# Patient Record
Sex: Female | Born: 1975 | Race: White | Hispanic: No | Marital: Single
Health system: Southern US, Academic
[De-identification: ages and names within clinical notes are randomized; demographics above are authoritative.]

## PROBLEM LIST (undated history)

## (undated) ENCOUNTER — Encounter: Attending: Family Medicine | Primary: Family Medicine

## (undated) ENCOUNTER — Encounter: Attending: Neurology | Primary: Neurology

## (undated) ENCOUNTER — Encounter

## (undated) ENCOUNTER — Telehealth

## (undated) ENCOUNTER — Encounter: Payer: BLUE CROSS/BLUE SHIELD | Attending: Family Medicine | Primary: Family Medicine

## (undated) ENCOUNTER — Ambulatory Visit

## (undated) ENCOUNTER — Telehealth: Attending: Family Medicine | Primary: Family Medicine

## (undated) ENCOUNTER — Encounter: Attending: Clinical | Primary: Clinical

## (undated) ENCOUNTER — Ambulatory Visit: Attending: Family Medicine | Primary: Family Medicine

## (undated) ENCOUNTER — Encounter: Attending: Anesthesiology | Primary: Anesthesiology

## (undated) ENCOUNTER — Encounter: Attending: Specialist | Primary: Specialist

## (undated) ENCOUNTER — Ambulatory Visit: Payer: BLUE CROSS/BLUE SHIELD

## (undated) ENCOUNTER — Encounter
Attending: Student in an Organized Health Care Education/Training Program | Primary: Student in an Organized Health Care Education/Training Program

## (undated) ENCOUNTER — Telehealth: Attending: Neurology | Primary: Neurology

## (undated) ENCOUNTER — Ambulatory Visit: Payer: BLUE CROSS/BLUE SHIELD | Attending: Specialist | Primary: Specialist

## (undated) ENCOUNTER — Encounter: Attending: Sports Medicine | Primary: Sports Medicine

## (undated) ENCOUNTER — Encounter: Attending: Internal Medicine | Primary: Internal Medicine

## (undated) ENCOUNTER — Encounter: Payer: BLUE CROSS/BLUE SHIELD | Attending: Specialist | Primary: Specialist

## (undated) ENCOUNTER — Encounter: Payer: BLUE CROSS/BLUE SHIELD | Attending: Sports Medicine | Primary: Sports Medicine

## (undated) ENCOUNTER — Encounter: Payer: PRIVATE HEALTH INSURANCE | Attending: Family Medicine | Primary: Family Medicine

## (undated) ENCOUNTER — Telehealth: Attending: Clinical | Primary: Clinical

## (undated) ENCOUNTER — Telehealth: Payer: PRIVATE HEALTH INSURANCE

## (undated) ENCOUNTER — Ambulatory Visit: Payer: PRIVATE HEALTH INSURANCE | Attending: Family Medicine | Primary: Family Medicine

## (undated) ENCOUNTER — Encounter: Payer: BLUE CROSS/BLUE SHIELD | Attending: Neurology | Primary: Neurology

## (undated) ENCOUNTER — Ambulatory Visit: Payer: BLUE CROSS/BLUE SHIELD | Attending: Neurology | Primary: Neurology

## (undated) ENCOUNTER — Inpatient Hospital Stay: Payer: Self-pay

## (undated) DIAGNOSIS — N200 Calculus of kidney: Secondary | ICD-10-CM

## (undated) DIAGNOSIS — G43909 Migraine, unspecified, not intractable, without status migrainosus: Secondary | ICD-10-CM

## (undated) DIAGNOSIS — M1990 Unspecified osteoarthritis, unspecified site: Secondary | ICD-10-CM

## (undated) DIAGNOSIS — M797 Fibromyalgia: Secondary | ICD-10-CM

## (undated) DIAGNOSIS — K219 Gastro-esophageal reflux disease without esophagitis: Secondary | ICD-10-CM

## (undated) DIAGNOSIS — K5900 Constipation, unspecified: Secondary | ICD-10-CM

## (undated) HISTORY — DX: Migraine, unspecified, not intractable, without status migrainosus: G43.909

## (undated) HISTORY — PX: LEEP: SHX91

## (undated) HISTORY — DX: Constipation, unspecified: K59.00

## (undated) HISTORY — PX: HEMORROIDECTOMY: SUR656

## (undated) HISTORY — DX: Gastro-esophageal reflux disease without esophagitis: K21.9

## (undated) HISTORY — DX: Fibromyalgia: M79.7

## (undated) HISTORY — DX: Calculus of kidney: N20.0

## (undated) MED ORDER — HYDROCODONE 5 MG-ACETAMINOPHEN 325 MG TABLET: 1 | tablet | Freq: Every day | 0 refills | 30 days

## (undated) MED ORDER — VITAMIN D3 ORAL: ORAL | 0.00000 days

## (undated) MED ORDER — VITAMIN C ORAL: ORAL | 0 days

---
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
## 2001-11-01 ENCOUNTER — Emergency Department (HOSPITAL_COMMUNITY): Admission: EM | Admit: 2001-11-01 | Discharge: 2001-11-01 | Payer: Self-pay | Admitting: Emergency Medicine

## 2003-09-10 ENCOUNTER — Encounter: Admission: RE | Admit: 2003-09-10 | Discharge: 2003-09-10 | Payer: Self-pay | Admitting: Family Medicine

## 2004-08-12 ENCOUNTER — Ambulatory Visit: Payer: Self-pay | Admitting: Surgery

## 2005-08-18 ENCOUNTER — Emergency Department: Payer: Self-pay | Admitting: Emergency Medicine

## 2005-08-18 ENCOUNTER — Other Ambulatory Visit: Payer: Self-pay

## 2005-10-27 ENCOUNTER — Ambulatory Visit: Payer: Self-pay | Admitting: Gastroenterology

## 2006-02-22 ENCOUNTER — Ambulatory Visit: Payer: Self-pay | Admitting: Gastroenterology

## 2006-03-19 ENCOUNTER — Ambulatory Visit: Payer: Self-pay | Admitting: Unknown Physician Specialty

## 2006-05-22 ENCOUNTER — Ambulatory Visit: Payer: Self-pay | Admitting: Unknown Physician Specialty

## 2007-01-19 ENCOUNTER — Other Ambulatory Visit: Payer: Self-pay

## 2007-01-19 ENCOUNTER — Emergency Department: Payer: Self-pay | Admitting: Emergency Medicine

## 2007-03-22 ENCOUNTER — Ambulatory Visit: Payer: Self-pay | Admitting: Internal Medicine

## 2007-03-23 ENCOUNTER — Ambulatory Visit: Payer: Self-pay | Admitting: Internal Medicine

## 2007-03-29 ENCOUNTER — Ambulatory Visit: Payer: Self-pay | Admitting: Urology

## 2007-04-04 ENCOUNTER — Ambulatory Visit: Payer: Self-pay | Admitting: Urology

## 2007-04-09 ENCOUNTER — Ambulatory Visit: Payer: Self-pay | Admitting: Urology

## 2007-04-12 ENCOUNTER — Ambulatory Visit: Payer: Self-pay | Admitting: Urology

## 2007-05-18 ENCOUNTER — Encounter: Payer: Self-pay | Admitting: Internal Medicine

## 2007-06-07 ENCOUNTER — Encounter: Payer: Self-pay | Admitting: Internal Medicine

## 2007-06-15 ENCOUNTER — Ambulatory Visit: Payer: Self-pay

## 2007-06-20 ENCOUNTER — Ambulatory Visit: Payer: Self-pay | Admitting: Urology

## 2007-06-21 ENCOUNTER — Ambulatory Visit: Payer: Self-pay | Admitting: Urology

## 2007-07-08 ENCOUNTER — Encounter: Payer: Self-pay | Admitting: Internal Medicine

## 2007-07-26 ENCOUNTER — Ambulatory Visit: Payer: Self-pay | Admitting: Internal Medicine

## 2007-12-28 ENCOUNTER — Emergency Department: Payer: Self-pay

## 2008-06-11 ENCOUNTER — Ambulatory Visit: Payer: Self-pay | Admitting: Physician Assistant

## 2009-06-06 HISTORY — PX: NASAL SEPTUM SURGERY: SHX37

## 2009-06-06 HISTORY — PX: CHOLECYSTECTOMY: SHX55

## 2009-06-06 HISTORY — PX: NASAL SINUS SURGERY: SHX719

## 2009-06-30 ENCOUNTER — Ambulatory Visit: Payer: Self-pay | Admitting: Internal Medicine

## 2009-07-06 ENCOUNTER — Ambulatory Visit: Payer: Self-pay | Admitting: Internal Medicine

## 2009-08-06 ENCOUNTER — Ambulatory Visit: Payer: Self-pay | Admitting: Surgery

## 2009-08-12 ENCOUNTER — Ambulatory Visit: Payer: Self-pay | Admitting: Otolaryngology

## 2009-08-27 ENCOUNTER — Ambulatory Visit: Payer: Self-pay | Admitting: Surgery

## 2009-10-28 ENCOUNTER — Ambulatory Visit: Payer: Self-pay | Admitting: Internal Medicine

## 2010-01-11 ENCOUNTER — Encounter: Payer: Self-pay | Admitting: Maternal & Fetal Medicine

## 2010-04-22 ENCOUNTER — Ambulatory Visit: Payer: Self-pay | Admitting: Obstetrics and Gynecology

## 2010-04-27 ENCOUNTER — Observation Stay: Payer: Self-pay

## 2010-04-28 ENCOUNTER — Observation Stay: Payer: Self-pay | Admitting: Obstetrics and Gynecology

## 2010-05-06 ENCOUNTER — Ambulatory Visit: Payer: Self-pay | Admitting: Obstetrics and Gynecology

## 2010-06-14 ENCOUNTER — Observation Stay: Payer: Self-pay | Admitting: Obstetrics and Gynecology

## 2010-06-27 ENCOUNTER — Encounter: Payer: Self-pay | Admitting: Family Medicine

## 2010-06-27 ENCOUNTER — Observation Stay: Payer: Self-pay

## 2010-07-11 ENCOUNTER — Inpatient Hospital Stay: Payer: Self-pay | Admitting: Obstetrics and Gynecology

## 2010-08-26 ENCOUNTER — Ambulatory Visit: Payer: Self-pay | Admitting: Internal Medicine

## 2010-09-22 ENCOUNTER — Encounter: Payer: Self-pay | Admitting: Internal Medicine

## 2010-10-05 ENCOUNTER — Encounter: Payer: Self-pay | Admitting: Internal Medicine

## 2010-11-05 ENCOUNTER — Encounter: Payer: Self-pay | Admitting: Internal Medicine

## 2011-02-11 ENCOUNTER — Ambulatory Visit: Payer: Self-pay | Admitting: Internal Medicine

## 2011-03-08 ENCOUNTER — Other Ambulatory Visit: Payer: Self-pay | Admitting: Internal Medicine

## 2011-03-11 MED ORDER — ESOMEPRAZOLE MAGNESIUM 40 MG PO CPDR: 40.0000 mg | DELAYED_RELEASE_CAPSULE | Freq: Two times a day (BID) | ORAL | Status: DC

## 2011-03-16 IMAGING — US ABDOMEN ULTRASOUND
1 series · 17 of 25 positions shown · non-contrast
Comparison: none

REASON FOR EXAM: RUQ pain    nausea      abd distention   HX right renal
stone  back pain    ...
COMMENTS:

[Series 1: abdomen ultrasound · 17 of 83 slices shown]
[im 1/83]
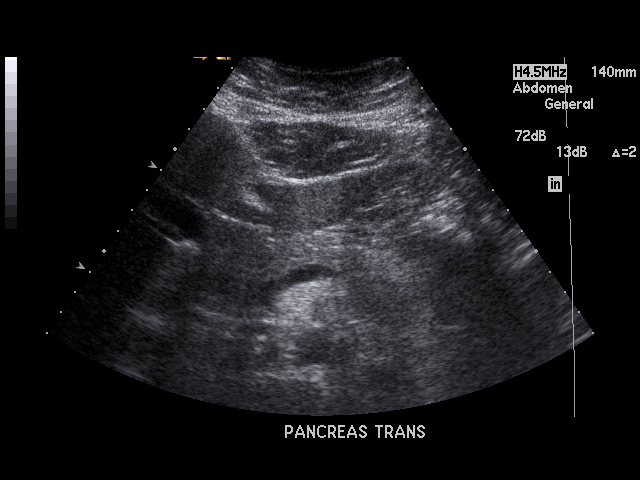
[im 7/83]
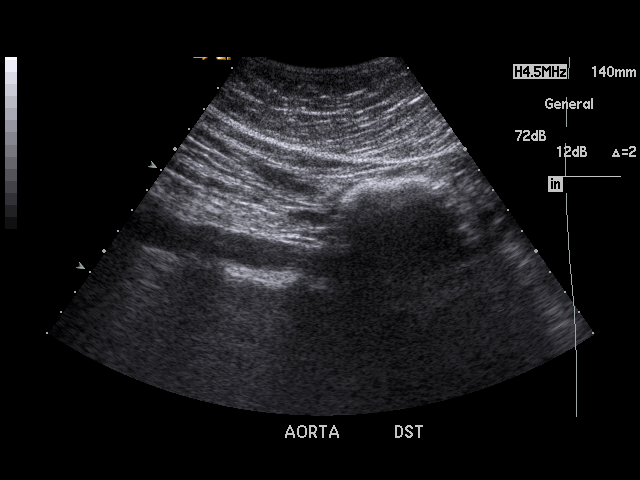
[im 11/83]
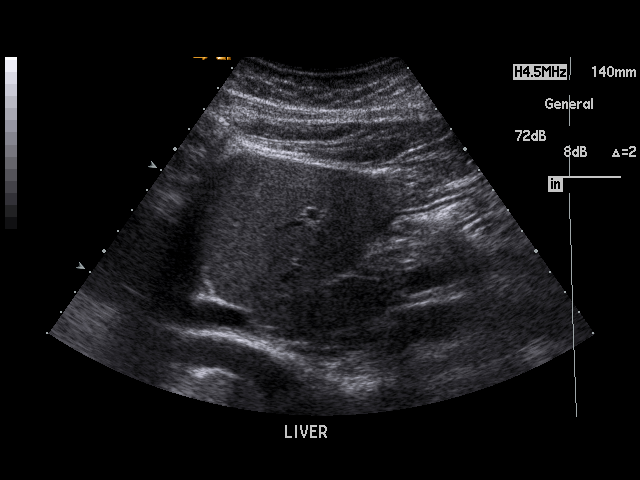
[im 18/83]
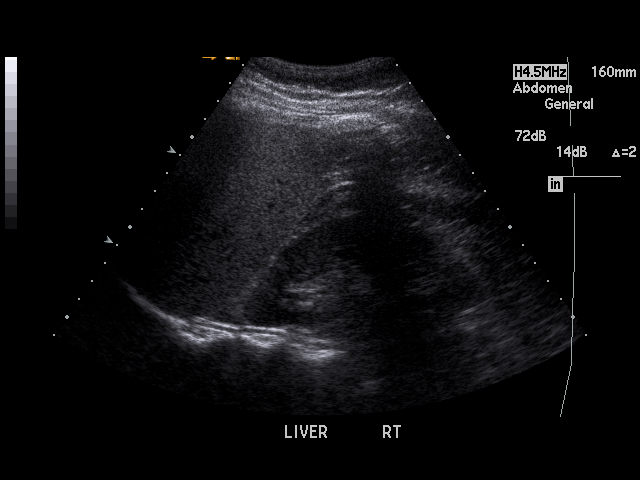
[im 21/83]
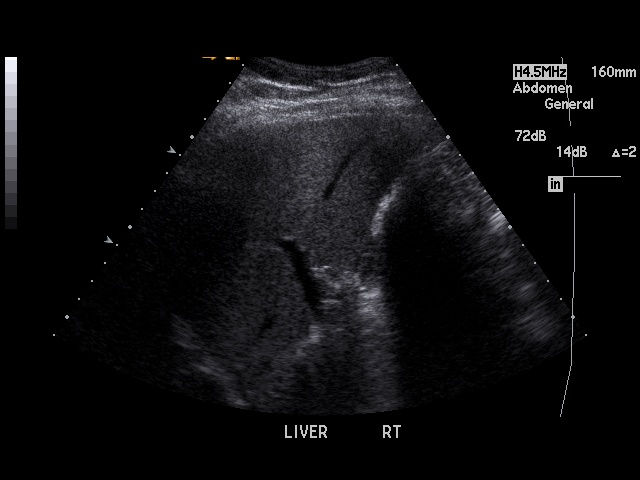
[im 28/83]
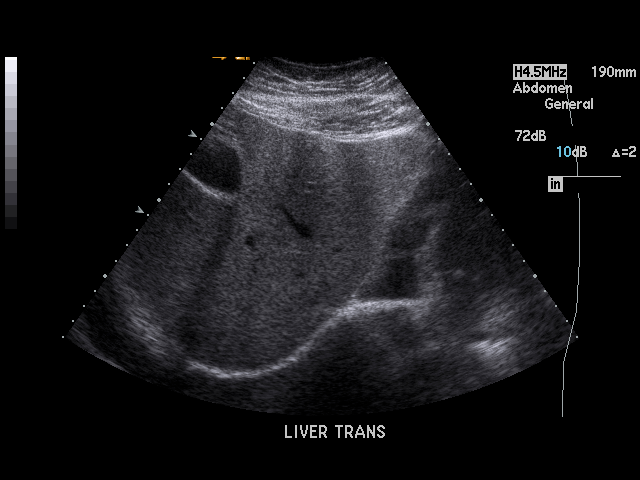
[im 31/83]
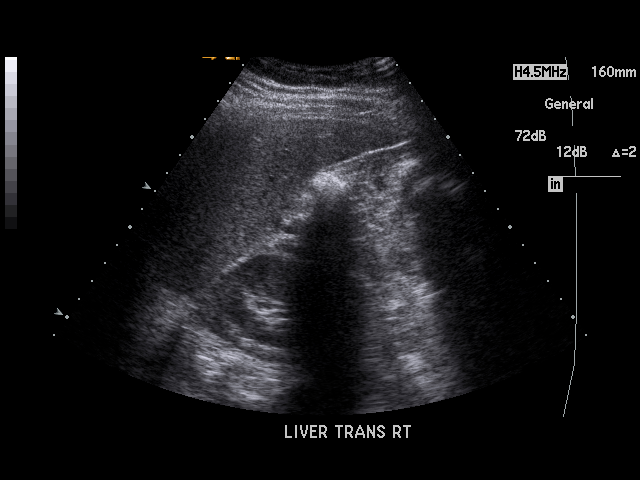
[im 38/83]
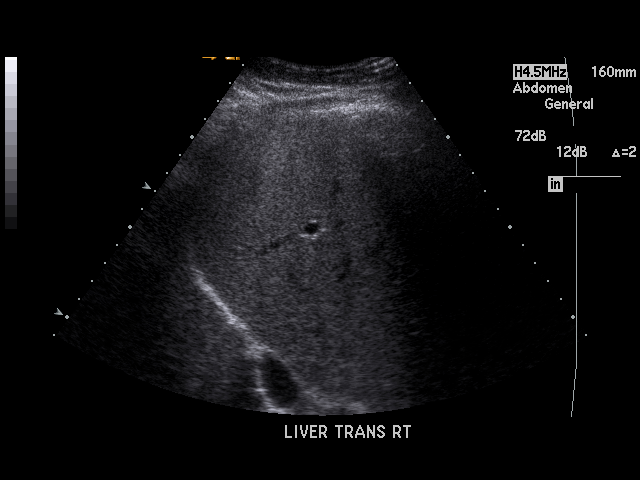
[im 42/83]
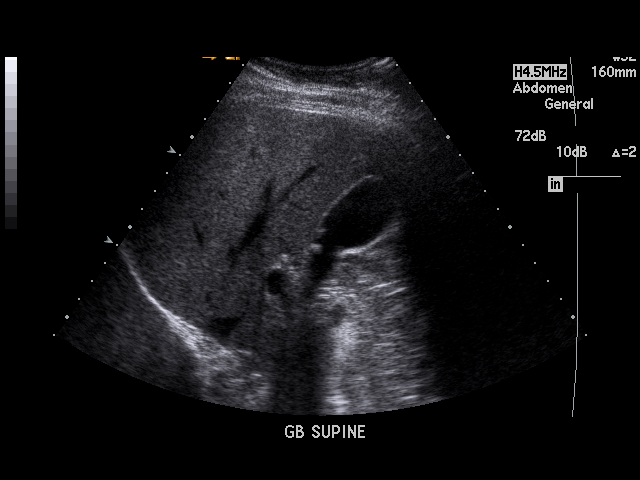
[im 45/83]
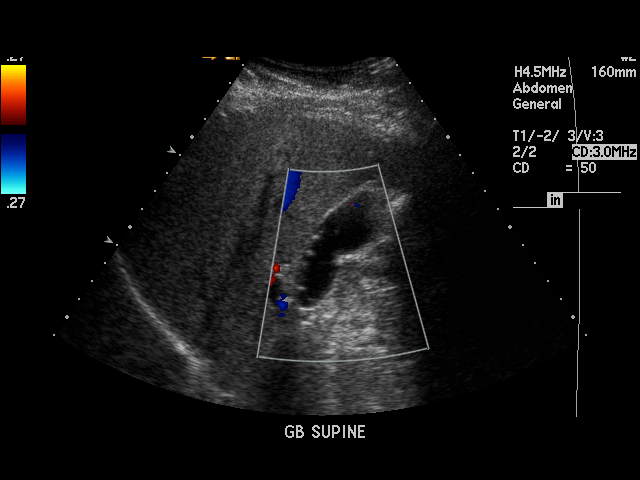
[im 52/83]
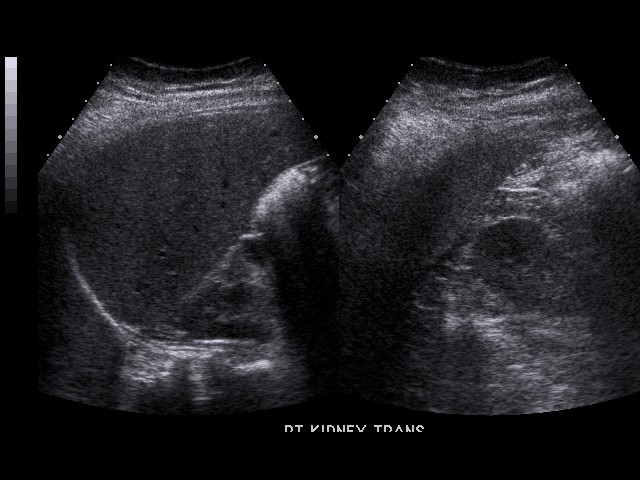
[im 55/83]
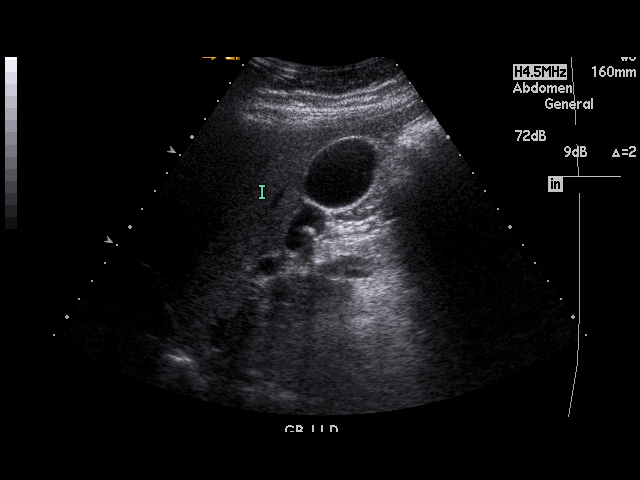
[im 62/83]
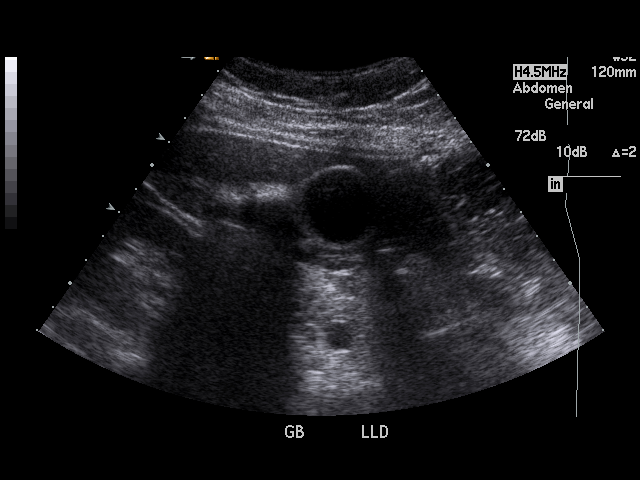
[im 65/83]
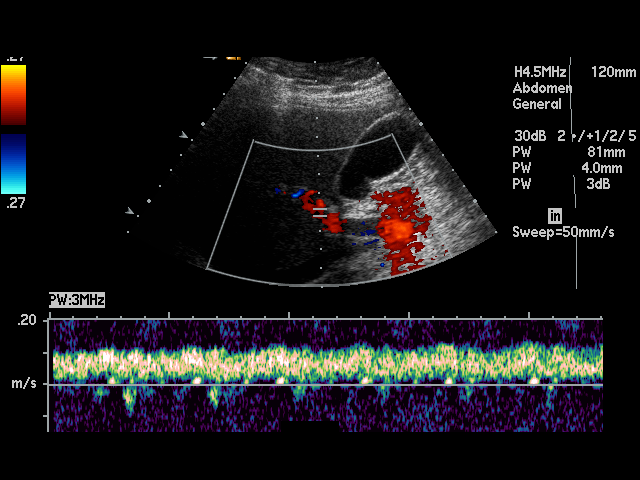
[im 72/83]
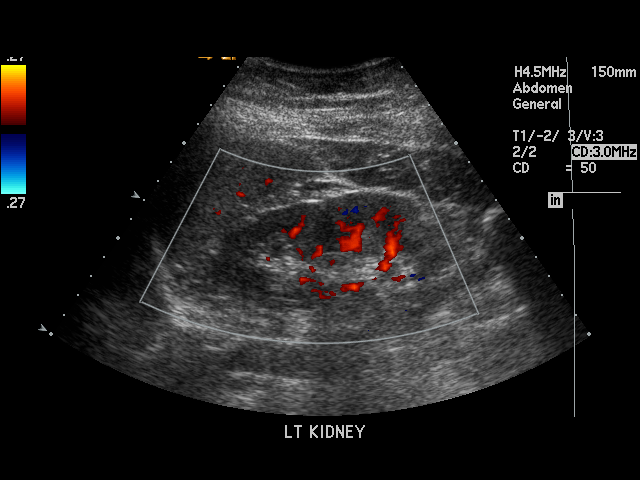
[im 76/83]
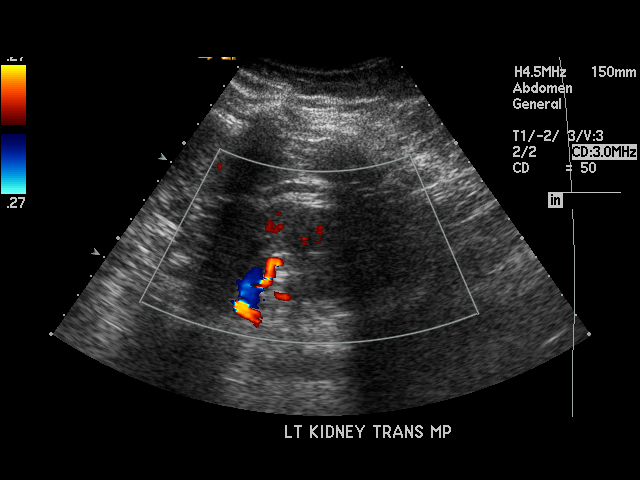
[im 83/83]
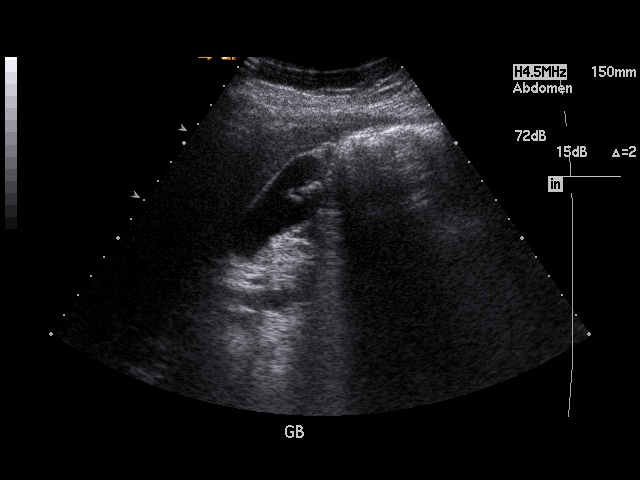

[17 of 25 positions shown; findings below may reference images not displayed]

PROCEDURE:     US  - US ABDOMEN GENERAL SURVEY  - June 30, 2009 [DATE]

RESULT:     The liver, spleen, abdominal aorta and inferior vena cava show
no significant abnormalities. The pancreatic tail is partially obscured but
otherwise the pancreas is normal in appearance. There are echodensities in
the gallbladder compatible with gallstones. There is no thickening of the
gallbladder wall which measures 2.5 mm at maximum thickness. The common bile
duct measures 3.2 mm in diameter which is within normal limits. The kidneys
show no hydronephrosis. There is no ascites.
IMPRESSION: Cholelithiasis. No associated thickening of the gallbladder
wall or pericholecystic fluid is seen.

## 2011-06-07 HISTORY — PX: TONSILLECTOMY AND ADENOIDECTOMY: SHX28

## 2011-06-22 ENCOUNTER — Ambulatory Visit: Payer: Self-pay | Admitting: Otolaryngology

## 2011-08-11 ENCOUNTER — Telehealth: Payer: Self-pay | Admitting: Internal Medicine

## 2011-08-11 NOTE — Telephone Encounter (Signed)
A prior authorization is needed for patients Nexium I have initiated the prior authorization and they are faxing the form that needs to be filled out.

## 2011-08-12 NOTE — Telephone Encounter (Signed)
Form has been received and in the red folder to be signed.

## 2011-08-18 NOTE — Telephone Encounter (Signed)
Prior authorization has been approved, pharmacy and patient notified.

## 2011-10-10 ENCOUNTER — Other Ambulatory Visit: Payer: Self-pay | Admitting: Internal Medicine

## 2011-10-10 MED ORDER — VALACYCLOVIR HCL 1 G PO TABS: 1000.0000 mg | ORAL_TABLET | Freq: Every day | ORAL | Status: AC

## 2012-01-26 ENCOUNTER — Ambulatory Visit: Payer: Self-pay | Admitting: Internal Medicine

## 2012-02-10 ENCOUNTER — Other Ambulatory Visit: Payer: Self-pay | Admitting: Internal Medicine

## 2012-02-13 ENCOUNTER — Ambulatory Visit: Payer: Self-pay | Admitting: Otolaryngology

## 2012-02-13 LAB — PREGNANCY, URINE: Pregnancy Test, Urine: NEGATIVE m[IU]/mL

## 2012-04-02 ENCOUNTER — Other Ambulatory Visit: Payer: Self-pay | Admitting: Internal Medicine

## 2012-04-03 NOTE — Telephone Encounter (Signed)
No refills until she establishes herself here. It has been a yar and a half since I changed practices.

## 2012-04-04 NOTE — Telephone Encounter (Signed)
Left message on patientvm letting her know that refill has been refused she needs an OV in order to get refills.

## 2013-04-18 ENCOUNTER — Emergency Department: Payer: Self-pay | Admitting: Emergency Medicine

## 2013-07-16 ENCOUNTER — Ambulatory Visit: Payer: Self-pay | Admitting: Internal Medicine

## 2013-07-24 ENCOUNTER — Encounter: Payer: Self-pay | Admitting: Adult Health

## 2013-07-24 ENCOUNTER — Ambulatory Visit (INDEPENDENT_AMBULATORY_CARE_PROVIDER_SITE_OTHER): Payer: Self-pay | Admitting: Adult Health

## 2013-07-24 ENCOUNTER — Encounter (INDEPENDENT_AMBULATORY_CARE_PROVIDER_SITE_OTHER): Payer: Self-pay

## 2013-07-24 VITALS — BP 115/79 | HR 90 | Temp 98.4°F | Resp 14 | Ht 64.0 in | Wt 194.8 lb

## 2013-07-24 DIAGNOSIS — K5900 Constipation, unspecified: Secondary | ICD-10-CM

## 2013-07-24 DIAGNOSIS — M549 Dorsalgia, unspecified: Secondary | ICD-10-CM

## 2013-07-24 NOTE — Progress Notes (Signed)
Pre visit review using our clinic review tool, if applicable. No additional management support is needed unless otherwise documented below in the visit note. 

## 2013-07-24 NOTE — Progress Notes (Signed)
Patient ID: Brooke Rush, female   DOB: [birth year], 38 y.o.   MRN: [id number]    Subjective:    Patient ID: Brooke Rush, female    DOB: 07/22/1975, 38 y.o.   MRN: [id number]  HPI  Pt is a 38 y/o female who presents to clinic to establish care. She was previously followed by Dr. Heber [place]. Will request records. Overall she is doing well other than a recent work related injury; although, she did not report this. The patient had slid off the recliner in her home and Brooke Rush was picking her up. She is being seen by a chiropractor in [place] and has had [place] [facility] treatment. She reports improvement with this therapy.    Past Medical History  Diagnosis Date  . Migraines   . GERD (gastroesophageal reflux disease)   . Kidney stones   . Fibromyalgia     Diagnosed by Dr. Derrel Nip  . Plantar fasciitis     bilateral  . Constipation     2/2 pain medication     Past Surgical History  Procedure Laterality Date  . Cholecystectomy  2011  . Tonsillectomy and adenoidectomy  2013  . Carpal tunnel release Bilateral 1998  . Nasal sinus surgery  2011    Dr. Pryor Ochoa  . Nasal septum surgery  2011  . Hemorroidectomy      Dr. Tamala Julian     Family History  Problem Relation Age of Onset  . Arthritis Mother     Degenerative Disc Dz  . Obesity Mother   . Alcohol abuse Father   . Hyperlipidemia Father   . Heart disease Father     CAD - CABG age 52  . Kidney disease Maternal Grandmother   . Heart disease Maternal Grandmother   . Heart disease Maternal Grandfather   . Hyperlipidemia Maternal Grandfather   . Hypertension Maternal Grandfather      History   Social History  . Marital Status: Single    Spouse Name: N/A    Number of Children: 3  . Years of Education: 14   Occupational History  . CNA for [employer]   Social History Main Topics  . Smoking status: Current Every Day Smoker -- 1.00 packs/day for 21 years  . Smokeless tobacco: Not on file   . Alcohol Use: Yes     Comment: 1-2 drinks twice a week  . Drug Use: No  . Sexual Activity: Not on file   Other Topics Concern  . Not on file   Social History Narrative   Brooke Rush grew up in [place], [place]. She is living at home with her boyfriend and one of her children. She also has a 37 y/o daughter who lives with her aunt. Brooke Rush also had a baby girl that passed away in [id number] (55 days old). 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 [name]       Current Outpatient Prescriptions on File Prior to Visit  Medication Sig Dispense Refill  . NEXIUM 40 MG capsule TAKE ONE CAPSULE BY MOUTH TWICE DAILY  60 each  2   No current facility-administered medications on file prior to visit.     Review of Systems  Constitutional: Negative.   HENT: Negative.   Eyes: Negative.   Respiratory: Negative.   Cardiovascular: Negative.  Gastrointestinal: Negative.   Endocrine: Negative.   Genitourinary: Negative.   Musculoskeletal: Positive for back pain and neck pain.  Skin: Negative.   Allergic/Immunologic: Negative.   Neurological: Negative.   Hematological: Negative.   Psychiatric/Behavioral: Negative.        Objective:  BP 115/79  Pulse 90  Temp(Src) 98.4 F (36.9 C) (Oral)  Resp 14  Ht 5\' 4"  (1.626 m)  Wt 194 lb 12 oz (88.338 kg)  BMI 33.41 kg/m2  SpO2 98%  LMP 07/21/2013   Physical Exam  Constitutional: She is oriented to person, place, and time. She appears well-developed and well-nourished. No distress.  HENT:  Head: Normocephalic and atraumatic.  Right Ear: External ear normal.  Left Ear: External ear normal.  Nose: Nose normal.  Mouth/Throat: Oropharynx is clear and moist.  Eustachian tubes bilateral ears  Eyes: Conjunctivae and EOM are normal. Pupils are equal, round, and reactive to light.  Neck: Normal range of motion. Neck supple. No tracheal  deviation present.  Cardiovascular: Normal rate, regular rhythm, normal heart sounds and intact distal pulses.  Exam reveals no gallop and no friction rub.   No murmur heard. Pulmonary/Chest: Effort normal and breath sounds normal. No respiratory distress. She has no wheezes. She has no rales.  Abdominal: Soft. Bowel sounds are normal. She exhibits no distension. There is no tenderness.  Musculoskeletal: Normal range of motion. She exhibits no edema and no tenderness.  Neurological: She is alert and oriented to person, place, and time. She has normal reflexes. Coordination normal.  Skin: Skin is warm and dry.  Psychiatric: She has a normal mood and affect. Her behavior is normal. Judgment and thought content normal.   BP 115/79  Pulse 90  Temp(Src) 98.4 F (36.9 C) (Oral)  Resp 14  Ht 5\' 4"  (1.626 m)  Wt 194 lb 12 oz (88.338 kg)  BMI 33.41 kg/m2  SpO2 98%  LMP 07/21/2013      Assessment & Plan:   1. Back pain Following injury at work while lifting patient. Her symptoms are improving with the care of a chiropractor. She is having laser treatments done.  2. Constipation Suspect 2/2 pain medication. Miralax safe to take on a daily basis. She can adjust dose based on stool frequency and consistency.

## 2013-07-24 NOTE — Patient Instructions (Signed)
   Thank you for choosing [facility] for your health care needs.  Please have your labs drawn at your earliest convenience.  The results will be available through MyChart for your convenience. Please remember to activate this. The activation code is located at the end of this form.  I am requesting your medical records from Dr. Heber .  Please feel free to call with any questions or concerns.

## 2013-07-25 ENCOUNTER — Telehealth: Payer: Self-pay | Admitting: Adult Health

## 2013-07-25 NOTE — Telephone Encounter (Signed)
Relevant patient education assigned to patient using Emmi. ° °

## 2013-08-05 ENCOUNTER — Ambulatory Visit (INDEPENDENT_AMBULATORY_CARE_PROVIDER_SITE_OTHER): Payer: Self-pay | Admitting: Adult Health

## 2013-08-05 ENCOUNTER — Encounter: Payer: Self-pay | Admitting: Adult Health

## 2013-08-05 VITALS — BP 113/78 | HR 87 | Temp 97.8°F | Resp 14 | Wt 198.0 lb

## 2013-08-05 DIAGNOSIS — R5383 Other fatigue: Secondary | ICD-10-CM | POA: Insufficient documentation

## 2013-08-05 DIAGNOSIS — Z720 Tobacco use: Secondary | ICD-10-CM | POA: Insufficient documentation

## 2013-08-05 DIAGNOSIS — IMO0001 Reserved for inherently not codable concepts without codable children: Secondary | ICD-10-CM

## 2013-08-05 DIAGNOSIS — M797 Fibromyalgia: Secondary | ICD-10-CM | POA: Insufficient documentation

## 2013-08-05 DIAGNOSIS — Z79899 Other long term (current) drug therapy: Secondary | ICD-10-CM | POA: Insufficient documentation

## 2013-08-05 DIAGNOSIS — A6000 Herpesviral infection of urogenital system, unspecified: Secondary | ICD-10-CM

## 2013-08-05 DIAGNOSIS — Z716 Tobacco abuse counseling: Secondary | ICD-10-CM

## 2013-08-05 DIAGNOSIS — F17200 Nicotine dependence, unspecified, uncomplicated: Secondary | ICD-10-CM

## 2013-08-05 DIAGNOSIS — E781 Pure hyperglyceridemia: Secondary | ICD-10-CM

## 2013-08-05 DIAGNOSIS — Z7189 Other specified counseling: Secondary | ICD-10-CM

## 2013-08-05 DIAGNOSIS — R5381 Other malaise: Secondary | ICD-10-CM

## 2013-08-05 LAB — LIPID PANEL
CHOL/HDL RATIO: 3
CHOLESTEROL: 170 mg/dL (ref 0–200)
HDL: 50.7 mg/dL (ref >39.00)
LDL CALC: 105 mg/dL — AB (ref 0–99)
Triglycerides: 72 mg/dL (ref 0.0–149.0)
VLDL: 14.4 mg/dL (ref 0.0–40.0)

## 2013-08-05 LAB — COMPREHENSIVE METABOLIC PANEL
ALBUMIN: 4.1 g/dL (ref 3.5–5.2)
ALT: 20 U/L (ref 0–35)
AST: 18 U/L (ref 0–37)
Alkaline Phosphatase: 51 U/L (ref 39–117)
BUN: 8 mg/dL (ref 6–23)
CALCIUM: 9.4 mg/dL (ref 8.4–10.5)
CO2: 28 mEq/L (ref 19–32)
Chloride: 105 mEq/L (ref 96–112)
Creatinine, Ser: 0.6 mg/dL (ref 0.4–1.2)
GFR: 117.85 mL/min (ref >60.00)
Glucose, Bld: 109 mg/dL — ABNORMAL HIGH (ref 70–99)
POTASSIUM: 4.4 meq/L (ref 3.5–5.1)
SODIUM: 139 meq/L (ref 135–145)
Total Bilirubin: 0.8 mg/dL (ref 0.3–1.2)
Total Protein: 7.4 g/dL (ref 6.0–8.3)

## 2013-08-05 LAB — CBC WITH DIFFERENTIAL/PLATELET
BASOS PCT: 0.4 % (ref 0.0–3.0)
Basophils Absolute: 0 10*3/uL (ref 0.0–0.1)
EOS PCT: 5.1 % — AB (ref 0.0–5.0)
Eosinophils Absolute: 0.6 10*3/uL (ref 0.0–0.7)
HEMATOCRIT: 44.8 % (ref 36.0–46.0)
HEMOGLOBIN: 14.9 g/dL (ref 12.0–15.0)
LYMPHS PCT: 17.1 % (ref 12.0–46.0)
Lymphs Abs: 1.8 10*3/uL (ref 0.7–4.0)
MCHC: 33.3 g/dL (ref 30.0–36.0)
MCV: 98.2 fl (ref 78.0–100.0)
Monocytes Absolute: 0.6 10*3/uL (ref 0.1–1.0)
Monocytes Relative: 5.4 % (ref 3.0–12.0)
NEUTROS ABS: 7.8 10*3/uL — AB (ref 1.4–7.7)
Neutrophils Relative %: 72 % (ref 43.0–77.0)
Platelets: 321 10*3/uL (ref 150.0–400.0)
RBC: 4.56 Mil/uL (ref 3.87–5.11)
RDW: 13.9 % (ref 11.5–14.6)
WBC: 10.8 10*3/uL — AB (ref 4.5–10.5)

## 2013-08-05 LAB — TSH: TSH: 0.8 u[IU]/mL (ref 0.35–5.50)

## 2013-08-05 MED ORDER — VALACYCLOVIR HCL 1 G PO TABS: 1000.0000 mg | ORAL_TABLET | Freq: Two times a day (BID) | ORAL | Status: DC

## 2013-08-05 MED ORDER — TOPIRAMATE 25 MG PO TABS: 25.0000 mg | ORAL_TABLET | Freq: Two times a day (BID) | ORAL | Status: DC

## 2013-08-05 MED ORDER — LORAZEPAM 1 MG PO TABS: 1.0000 mg | ORAL_TABLET | Freq: Three times a day (TID) | ORAL | Status: DC | PRN

## 2013-08-05 MED ORDER — TOPIRAMATE 100 MG PO TABS: 100.0000 mg | ORAL_TABLET | Freq: Two times a day (BID) | ORAL | Status: DC

## 2013-08-05 MED ORDER — CYCLOBENZAPRINE HCL 5 MG PO TABS: 5.0000 mg | ORAL_TABLET | Freq: Three times a day (TID) | ORAL | Status: DC | PRN

## 2013-08-05 MED ORDER — HYDROCODONE-ACETAMINOPHEN 5-325 MG PO TABS: 1.0000 | ORAL_TABLET | Freq: Four times a day (QID) | ORAL | Status: DC | PRN

## 2013-08-05 MED ORDER — ONDANSETRON 4 MG PO TBDP: 4.0000 mg | ORAL_TABLET | Freq: Three times a day (TID) | ORAL | Status: DC | PRN

## 2013-08-05 NOTE — Progress Notes (Signed)
Pre visit review using our clinic review tool, if applicable. No additional management support is needed unless otherwise documented below in the visit note. 

## 2013-08-05 NOTE — Patient Instructions (Signed)
  Start Topamax 25 mg at bedtime for the 1st week. Then increase by 25 mg weekly.  I have provided you with two different prescriptions for Topamax. Next month you will only take 1 tablet (100 mg).  Prescriptions for Ativan, Norco and Flexeril for 3 months provided.  Please call with any concerns.

## 2013-08-05 NOTE — Progress Notes (Signed)
Subjective:    Patient ID: Brooke Rush, female    DOB: 09-09-1975, 37 y.o.   MRN: [id number]  HPI  Pt is a pleasant 38 y/o female who presents to clinic for medication refills and to discuss treatment for fibromyalgia. She reports taking medications as prescribed. She was taking lyrica for her fibromyalgia symptoms but did not feel this was as effective as Topamax. She also reports that she stopped taking both 2/2 cost and, at the time, she had no insurance. She would like to restart topamax. She also reports hx of genital herpes with ~ flares every 3 months during her menstrual cycle. She initially took the valtrex for a year for suppressive therapy but would like to have some on hand for the flare ups. She is still smoking. Is considering stopping and may want to try Chantix. She is not ready to do this today.  Current Outpatient Prescriptions on File Prior to Visit  Medication Sig Dispense Refill  . b complex vitamins tablet Take 1 tablet by mouth daily.      . cholecalciferol (VITAMIN D) 400 UNITS TABS tablet Take 400 Units by mouth daily.      Marland Kitchen NEXIUM 40 MG capsule TAKE ONE CAPSULE BY MOUTH TWICE DAILY  60 each  2  . Omega-3 Fatty Acids (FISH OIL) 1000 MG CAPS Take 1,000 mg by mouth 1 day or 1 dose.      . vitamin E 1000 UNIT capsule Take 1,000 Units by mouth daily.       No current facility-administered medications on file prior to visit.     Review of Systems  Constitutional: Positive for fatigue (hx of fibromyalgia).  Eyes: Negative.   Respiratory: Negative.   Cardiovascular: Negative.   Gastrointestinal: Negative.   Endocrine: Negative.   Genitourinary: Negative.   Musculoskeletal: Positive for back pain and myalgias.  Skin: Negative.   Allergic/Immunologic: Negative.   Neurological: Negative.   Hematological: Negative.   Psychiatric/Behavioral: Negative.  Negative for behavioral problems, confusion, sleep disturbance and agitation. The patient is not nervous/anxious.         Objective:   Physical Exam  Constitutional: She is oriented to person, place, and time. She appears well-developed and well-nourished. No distress.  HENT:  Head: Normocephalic and atraumatic.  Eyes: Conjunctivae and EOM are normal.  Neck: Normal range of motion. Neck supple. No tracheal deviation present.  Cardiovascular: Normal rate and regular rhythm.   Pulmonary/Chest: Effort normal. No respiratory distress.  Musculoskeletal: Normal range of motion.  Neurological: She is alert and oriented to person, place, and time. She has normal reflexes. Coordination normal.  Skin: Skin is warm and dry.  Psychiatric: She has a normal mood and affect. Her behavior is normal. Judgment and thought content normal.       Assessment & Plan:    1. Fatigue Ongoing fatigue. Check labs. - CBC with Differential - TSH  2. Medication management Refills for 3 months provided. Check kidney, liver, electrolytes - Comprehensive metabolic panel  3. Hypertriglyceridemia Reports elevated triglycerides during work fair. She was not fasting. Do not think this was accurate as triglycerides are affected by food. Check labs - Lipid panel  4. Fibromyalgia Start topamax 25 mg qHS x 1 week then increase by 25 mg weekly until 100 mg daily. Prescriptions provided.   5. Genital herpes Valtrex prescription for flare ups. Currently none.  6. Tobacco abuse counseling Considering smoking cessation. May want to try Chantix. Advised to call when she decides and  I will send in prescription to her pharmacy.

## 2013-08-06 ENCOUNTER — Telehealth: Payer: Self-pay | Admitting: Adult Health

## 2013-08-06 ENCOUNTER — Encounter: Payer: Self-pay | Admitting: *Deleted

## 2013-08-06 NOTE — Telephone Encounter (Signed)
Relevant patient education assigned to patient using Emmi. ° °

## 2014-01-01 ENCOUNTER — Telehealth: Payer: Self-pay | Admitting: *Deleted

## 2014-01-01 NOTE — Telephone Encounter (Signed)
Pt called requesting Lorazepam and Hydrocodone refill.  Last refill and OV 3.2.15.  Please advise refill.

## 2014-01-02 ENCOUNTER — Other Ambulatory Visit: Payer: Self-pay | Admitting: Adult Health

## 2014-01-02 MED ORDER — HYDROCODONE-ACETAMINOPHEN 5-325 MG PO TABS: ORAL_TABLET | ORAL | Status: DC

## 2014-01-02 MED ORDER — LORAZEPAM 1 MG PO TABS: 1.0000 mg | ORAL_TABLET | Freq: Every evening | ORAL | Status: DC | PRN

## 2014-01-03 ENCOUNTER — Other Ambulatory Visit: Payer: Self-pay | Admitting: *Deleted

## 2014-01-03 MED ORDER — LORAZEPAM 1 MG PO TABS: 1.0000 mg | ORAL_TABLET | Freq: Every evening | ORAL | Status: DC | PRN

## 2014-01-03 MED ORDER — HYDROCODONE-ACETAMINOPHEN 5-325 MG PO TABS: ORAL_TABLET | ORAL | Status: DC

## 2014-01-03 NOTE — Telephone Encounter (Signed)
Message from Charolette Forward, NP sent at 01/02/2014 9:49 PM ----- Please call pt to find out why she is on hydrocodone.   Left message for pt to return my call.

## 2014-01-03 NOTE — Telephone Encounter (Signed)
Pt states she take it for "all over pain", also right shoulder and arm pain. Has been taking it for awhile. Last Rx'd by you 08/05/13, takes usually one tablet daily. Asking to pick up today because she will be leaving out of town

## 2014-01-03 NOTE — Telephone Encounter (Signed)
Ok to refill with new directions per Raquel. Pt notified ready for pickup.

## 2014-07-09 ENCOUNTER — Encounter: Payer: Self-pay | Admitting: Nurse Practitioner

## 2014-07-09 ENCOUNTER — Ambulatory Visit (INDEPENDENT_AMBULATORY_CARE_PROVIDER_SITE_OTHER): Payer: BLUE CROSS/BLUE SHIELD | Admitting: Nurse Practitioner

## 2014-07-09 VITALS — BP 118/82 | HR 95 | Temp 98.2°F | Resp 12 | Ht 64.0 in | Wt 194.4 lb

## 2014-07-09 DIAGNOSIS — M797 Fibromyalgia: Secondary | ICD-10-CM

## 2014-07-09 DIAGNOSIS — R300 Dysuria: Secondary | ICD-10-CM

## 2014-07-09 DIAGNOSIS — Z716 Tobacco abuse counseling: Secondary | ICD-10-CM

## 2014-07-09 DIAGNOSIS — Z0000 Encounter for general adult medical examination without abnormal findings: Secondary | ICD-10-CM

## 2014-07-09 DIAGNOSIS — Z79899 Other long term (current) drug therapy: Secondary | ICD-10-CM

## 2014-07-09 LAB — POCT URINALYSIS DIPSTICK
BILIRUBIN UA: NEGATIVE
GLUCOSE UA: NEGATIVE
Ketones, UA: NEGATIVE
Leukocytes, UA: NEGATIVE
NITRITE UA: NEGATIVE
Protein, UA: NEGATIVE
Spec Grav, UA: 1.01
Urobilinogen, UA: 0.2
pH, UA: 7

## 2014-07-09 MED ORDER — HYDROCODONE-ACETAMINOPHEN 5-325 MG PO TABS: ORAL_TABLET | ORAL | Status: DC

## 2014-07-09 MED ORDER — ONDANSETRON 4 MG PO TBDP: 4.0000 mg | ORAL_TABLET | Freq: Three times a day (TID) | ORAL | Status: DC | PRN

## 2014-07-09 MED ORDER — ESOMEPRAZOLE MAGNESIUM 40 MG PO CPDR: 40.0000 mg | DELAYED_RELEASE_CAPSULE | Freq: Two times a day (BID) | ORAL | Status: DC

## 2014-07-09 MED ORDER — VALACYCLOVIR HCL 1 G PO TABS: 1000.0000 mg | ORAL_TABLET | Freq: Two times a day (BID) | ORAL | Status: DC

## 2014-07-09 MED ORDER — TOPIRAMATE 25 MG PO TABS: 25.0000 mg | ORAL_TABLET | Freq: Two times a day (BID) | ORAL | Status: DC

## 2014-07-09 MED ORDER — LORAZEPAM 1 MG PO TABS: 1.0000 mg | ORAL_TABLET | Freq: Every evening | ORAL | Status: DC | PRN

## 2014-07-09 NOTE — Progress Notes (Signed)
Pre visit review using our clinic review tool, if applicable. No additional management support is needed unless otherwise documented below in the visit note. 

## 2014-07-09 NOTE — Patient Instructions (Addendum)
We will see you in 1 year for your next physical.   Please make your fasting lab appointment before leaving today.    Smoking Cessation Quitting smoking is important to your health and has many advantages. However, it is not always easy to quit since nicotine is a very addictive drug. Oftentimes, people try 3 times or more before being able to quit. This document explains the best ways for you to prepare to quit smoking. Quitting takes hard work and a lot of effort, but you can do it. ADVANTAGES OF QUITTING SMOKING  You will live longer, feel better, and live better.  Your body will feel the impact of quitting smoking almost immediately.  Within 20 minutes, blood pressure decreases. Your pulse returns to its normal level.  After 8 hours, carbon monoxide levels in the blood return to normal. Your oxygen level increases.  After 24 hours, the chance of having a heart attack starts to decrease. Your breath, hair, and body stop smelling like smoke.  After 48 hours, damaged nerve endings begin to recover. Your sense of taste and smell improve.  After 72 hours, the body is virtually free of nicotine. Your bronchial tubes relax and breathing becomes easier.  After 2 to 12 weeks, lungs can hold more air. Exercise becomes easier and circulation improves.  The risk of having a heart attack, stroke, cancer, or lung disease is greatly reduced.  After 1 year, the risk of coronary heart disease is cut in half.  After 5 years, the risk of stroke falls to the same as a nonsmoker.  After 10 years, the risk of lung cancer is cut in half and the risk of other cancers decreases significantly.  After 15 years, the risk of coronary heart disease drops, usually to the level of a nonsmoker.  If you are pregnant, quitting smoking will improve your chances of having a healthy baby.  The people you live with, especially any children, will be healthier.  You will have extra money to spend on things other  than cigarettes. QUESTIONS TO THINK ABOUT BEFORE ATTEMPTING TO QUIT You may want to talk about your answers with your health care provider.  Why do you want to quit?  If you tried to quit in the past, what helped and what did not?  What will be the most difficult situations for you after you quit? How will you plan to handle them?  Who can help you through the tough times? Your family? Friends? A health care provider?  What pleasures do you get from smoking? What ways can you still get pleasure if you quit? Here are some questions to ask your health care provider:  How can you help me to be successful at quitting?  What medicine do you think would be best for me and how should I take it?  What should I do if I need more help?  What is smoking withdrawal like? How can I get information on withdrawal? GET READY  Set a quit date.  Change your environment by getting rid of all cigarettes, ashtrays, matches, and lighters in your home, car, or work. Do not let people smoke in your home.  Review your past attempts to quit. Think about what worked and what did not. GET SUPPORT AND ENCOURAGEMENT You have a better chance of being successful if you have help. You can get support in many ways.  Tell your family, friends, and coworkers that you are going to quit and need their support. Ask  them not to smoke around you.  Get individual, group, or telephone counseling and support. Programs are available at local hospitals and health centers. Call your local health department for information about programs in your area.  Spiritual beliefs and practices may help some smokers quit.  Download a "quit meter" on your computer to keep track of quit statistics, such as how long you have gone without smoking, cigarettes not smoked, and money saved.  Get a self-help book about quitting smoking and staying off tobacco. LEARN NEW SKILLS AND BEHAVIORS  Distract yourself from urges to smoke. Talk to  someone, go for a walk, or occupy your time with a task.  Change your normal routine. Take a different route to work. Drink tea instead of coffee. Eat breakfast in a different place.  Reduce your stress. Take a hot bath, exercise, or read a book.  Plan something enjoyable to do every day. Reward yourself for not smoking.  Explore interactive web-based programs that specialize in helping you quit. GET MEDICINE AND USE IT CORRECTLY Medicines can help you stop smoking and decrease the urge to smoke. Combining medicine with the above behavioral methods and support can greatly increase your chances of successfully quitting smoking.  Nicotine replacement therapy helps deliver nicotine to your body without the negative effects and risks of smoking. Nicotine replacement therapy includes nicotine gum, lozenges, inhalers, nasal sprays, and skin patches. Some may be available over-the-counter and others require a prescription.  Antidepressant medicine helps people abstain from smoking, but how this works is unknown. This medicine is available by prescription.  Nicotinic receptor partial agonist medicine simulates the effect of nicotine in your brain. This medicine is available by prescription. Ask your health care provider for advice about which medicines to use and how to use them based on your health history. Your health care provider will tell you what side effects to look out for if you choose to be on a medicine or therapy. Carefully read the information on the package. Do not use any other product containing nicotine while using a nicotine replacement product.  RELAPSE OR DIFFICULT SITUATIONS Most relapses occur within the first 3 months after quitting. Do not be discouraged if you start smoking again. Remember, most people try several times before finally quitting. You may have symptoms of withdrawal because your body is used to nicotine. You may crave cigarettes, be irritable, feel very hungry, cough  often, get headaches, or have difficulty concentrating. The withdrawal symptoms are only temporary. They are strongest when you first quit, but they will go away within 10-14 days. To reduce the chances of relapse, try to:  Avoid drinking alcohol. Drinking lowers your chances of successfully quitting.  Reduce the amount of caffeine you consume. Once you quit smoking, the amount of caffeine in your body increases and can give you symptoms, such as a rapid heartbeat, sweating, and anxiety.  Avoid smokers because they can make you want to smoke.  Do not let weight gain distract you. Many smokers will gain weight when they quit, usually less than 10 pounds. Eat a healthy diet and stay active. You can always lose the weight gained after you quit.  Find ways to improve your mood other than smoking. FOR MORE INFORMATION  www.smokefree.gov  Document Released: 05/17/2001 Document Revised: 10/07/2013 Document Reviewed: 09/01/2011 [employer] Patient Information 2015 [employer]. This information is not intended to replace advice given to you by your health care provider. Make sure you discuss any questions you have with your   health care provider.  

## 2014-07-09 NOTE — Progress Notes (Signed)
Subjective:    Patient ID: Brooke Rush, female    DOB: 1975-10-08, 39 y.o.   MRN: [id number]  HPI  Brooke Rush is a 39 yo female here for an annual physical.   1) Health Maintenance-   Diet- No formal, Eating everything Brooke Rush reports  Exercise- 2 x week 1 hour   Immunizations- Up to date  Mammogram- Knot in breast, Baseline 2011  Pap- Dr. Ouida Sills  Eye Exam- Needs one   Dental Exam- Up to date   2) Chronic Problems-  Fibromyalgia  3) Acute Problems- Wants to have another child  Dr. Ouida Sills  Tobacco Abuse past therapies:  Wellbutrin not helpful Patches- Jittery Nasal Spray- Not helpful  Hypnotized- not helpful  Shots- not helpful  Chantix- vivid dreams  2) Chronic pain from Fibromyalgia- hurts to pull up pants skin feels painful. Topamax- helpful  Massages, chiropractor, Laser therapy- at Chiropractor  PT, facet injections, TENS therapy  Hydrocodone- last dose- Sunday  Lorazepam- 1 mg- last dose- Sunday Zofran- nausea and pain    Review of Systems  Constitutional: Negative for fever, chills, diaphoresis and fatigue.  HENT: Negative for tinnitus and trouble swallowing.   Eyes: Negative for visual disturbance.  Respiratory: Negative for chest tightness, shortness of breath and wheezing.   Cardiovascular: Negative for chest pain, palpitations and leg swelling.  Gastrointestinal: Negative for nausea, vomiting, abdominal pain, diarrhea and abdominal distention.  Musculoskeletal: Positive for myalgias.  Skin: Negative for rash.  Neurological: Negative for dizziness, weakness, numbness and headaches.  Psychiatric/Behavioral: The patient is not nervous/anxious.    Past Medical History  Diagnosis Date  . Migraines   . GERD (gastroesophageal reflux disease)   . Kidney stones   . Fibromyalgia     Diagnosed by Dr. Derrel Nip  . Plantar fasciitis     bilateral  . Constipation     2/2 pain medication    History   Social History  . Marital Status: Single   Spouse Name: N/A    Number of Children: 3  . Years of Education: 14   Occupational History  . 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 Smoking status: Current Every Day Smoker -- 1.00 packs/day for 21 years  . Smokeless tobacco: Not on file  . Alcohol Use: 0.0 oz/week    0 Not specified per week     Comment: 1-2 drinks twice a week  . Drug Use: No  . Sexual Activity:    Partners: Male     Comment: Boyfriend   Other Topics Concern  . Not on file   Social History Narrative   Brooke Rush grew up in [place], [place]. Brooke Rush is living at home with her boyfriend and one of her children. Brooke Rush also has a 12 y/o daughter who lives with her aunt. Temitope also had a baby girl that passed away in 1537 (61 days old). 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 Maci enjoys crafting on her spare time.    Past Surgical History  Procedure Laterality Date  . Cholecystectomy  2011  . Tonsillectomy and adenoidectomy  2013  . Carpal tunnel release Bilateral 1998  . Nasal sinus surgery  2011    Dr. Pryor Ochoa  . Nasal septum surgery  2011  . Hemorroidectomy      Dr. Tamala Julian  Family History  Problem Relation Age of Onset  . Arthritis Mother     Degenerative Disc Dz  . Obesity Mother   . Alcohol abuse Father   . Hyperlipidemia Father   . Heart disease Father     CAD - CABG age 58  . Kidney disease Maternal Grandmother   . Heart disease Maternal Grandmother   . Heart disease Maternal Grandfather   . Hyperlipidemia Maternal Grandfather   . Hypertension Maternal Grandfather     No Known Allergies  Current Outpatient Prescriptions on File Prior to Visit  Medication Sig Dispense Refill  . b complex vitamins tablet Take 1 tablet by mouth daily.    . cholecalciferol (VITAMIN D) 400 UNITS TABS tablet Take 400 Units by mouth daily.    . cyclobenzaprine (FLEXERIL) 5 MG tablet  Take 1 tablet (5 mg total) by mouth 3 (three) times daily as needed for muscle spasms. 90 tablet 1  . Omega-3 Fatty Acids (FISH OIL) 1000 MG CAPS Take 1,000 mg by mouth 1 day or 1 dose.    . vitamin E 1000 UNIT capsule Take 1,000 Units by mouth daily.    Marland Kitchen topiramate (TOPAMAX) 100 MG tablet Take 1 tablet (100 mg total) by mouth 2 (two) times daily. (Patient not taking: Reported on 07/09/2014) 30 tablet 5   No current facility-administered medications on file prior to visit.      Objective:   Physical Exam  Constitutional: Brooke Rush is oriented to person, place, and time. Brooke Rush appears well-developed and well-nourished. No distress.  HENT:  Head: Normocephalic and atraumatic.  Right Ear: External ear normal.  Left Ear: External ear normal.  Nose: Nose normal.  Mouth/Throat: Oropharynx is clear and moist. No oropharyngeal exudate.  TMs and canals clear bilaterally  Eyes: Conjunctivae and EOM are normal. Pupils are equal, round, and reactive to light. Right eye exhibits no discharge. Left eye exhibits no discharge. No scleral icterus.  Neck: Normal range of motion. Neck supple. No thyromegaly present.  Cardiovascular: Normal rate, regular rhythm, normal heart sounds and intact distal pulses.  Exam reveals no gallop and no friction rub.   No murmur heard. Pulmonary/Chest: Effort normal and breath sounds normal. No respiratory distress. Brooke Rush has no wheezes. Brooke Rush has no rales. Brooke Rush exhibits no tenderness.  Abdominal: Soft. Bowel sounds are normal. Brooke Rush exhibits no distension and no mass. There is no tenderness. There is no rebound and no guarding.  Musculoskeletal: Normal range of motion. Brooke Rush exhibits no edema or tenderness.  Lymphadenopathy:    Brooke Rush has no cervical adenopathy.  Neurological: Brooke Rush is alert and oriented to person, place, and time. Brooke Rush has normal reflexes. No cranial nerve deficit. Brooke Rush exhibits normal muscle tone. Coordination normal.  Skin: Skin is warm and dry. No rash noted. Brooke Rush is not  diaphoretic. No erythema. No pallor.  Psychiatric: Brooke Rush has a normal mood and affect. Her behavior is normal. Judgment and thought content normal.      Assessment & Plan:

## 2014-07-12 DIAGNOSIS — Z0000 Encounter for general adult medical examination without abnormal findings: Secondary | ICD-10-CM | POA: Insufficient documentation

## 2014-07-12 DIAGNOSIS — R300 Dysuria: Secondary | ICD-10-CM | POA: Insufficient documentation

## 2014-07-12 NOTE — Assessment & Plan Note (Addendum)
Discussed and reviewed were health maintenance issues, PFSHx, and immunizations. Acute and chronic issues were also discussed. She has OB/GYN care for PAP and breast exam through another facility. FU in 1 year. Fasting labs ordered TSH, Lipid, CMET and CBC w/ diff will do as future.

## 2014-07-12 NOTE — Assessment & Plan Note (Signed)
I discussed with patient that she needs to be careful if she plans on becoming pregnant that she will need to stop these medications that may be harmful to the pt and she verbalized understanding. Order for hydrocodone-acetaminophen. CSC signed.

## 2014-07-12 NOTE — Assessment & Plan Note (Addendum)
Counseled pt on smoking cessation. She has a long history of trying. There has been no helpful advice yet. Pt understands importance of stopping smoking for health and for the future health of a child.

## 2014-07-12 NOTE — Assessment & Plan Note (Signed)
Filled scripts for ativan, norco, zofran, topamax, and nexium.

## 2014-07-15 ENCOUNTER — Other Ambulatory Visit: Payer: Self-pay | Admitting: *Deleted

## 2014-07-15 MED ORDER — VALACYCLOVIR HCL 1 G PO TABS: 1000.0000 mg | ORAL_TABLET | Freq: Two times a day (BID) | ORAL | Status: DC

## 2014-07-15 MED ORDER — ONDANSETRON 4 MG PO TBDP: 4.0000 mg | ORAL_TABLET | Freq: Three times a day (TID) | ORAL | Status: DC | PRN

## 2014-07-15 MED ORDER — ESOMEPRAZOLE MAGNESIUM 40 MG PO CPDR: 40.0000 mg | DELAYED_RELEASE_CAPSULE | Freq: Two times a day (BID) | ORAL | Status: DC

## 2014-07-15 MED ORDER — LORAZEPAM 1 MG PO TABS
1.0000 mg | ORAL_TABLET | Freq: Every evening | ORAL | Status: DC | PRN
Start: 2014-07-15 — End: 2014-09-02

## 2014-07-18 ENCOUNTER — Other Ambulatory Visit (INDEPENDENT_AMBULATORY_CARE_PROVIDER_SITE_OTHER): Payer: BLUE CROSS/BLUE SHIELD

## 2014-07-18 DIAGNOSIS — Z0000 Encounter for general adult medical examination without abnormal findings: Secondary | ICD-10-CM

## 2014-07-18 LAB — COMPREHENSIVE METABOLIC PANEL
ALK PHOS: 69 U/L (ref 39–117)
ALT: 23 U/L (ref 0–35)
AST: 20 U/L (ref 0–37)
Albumin: 4.5 g/dL (ref 3.5–5.2)
BUN: 7 mg/dL (ref 6–23)
CALCIUM: 9.9 mg/dL (ref 8.4–10.5)
CHLORIDE: 103 meq/L (ref 96–112)
CO2: 29 mEq/L (ref 19–32)
CREATININE: 0.7 mg/dL (ref 0.40–1.20)
GFR: 100.02 mL/min (ref >60.00)
Glucose, Bld: 108 mg/dL — ABNORMAL HIGH (ref 70–99)
Potassium: 4.3 mEq/L (ref 3.5–5.1)
Sodium: 138 mEq/L (ref 135–145)
Total Bilirubin: 0.5 mg/dL (ref 0.2–1.2)
Total Protein: 8.2 g/dL (ref 6.0–8.3)

## 2014-07-18 LAB — CBC WITH DIFFERENTIAL/PLATELET
BASOS ABS: 0.1 10*3/uL (ref 0.0–0.1)
Basophils Relative: 0.8 % (ref 0.0–3.0)
Eosinophils Absolute: 0.2 10*3/uL (ref 0.0–0.7)
Eosinophils Relative: 2.5 % (ref 0.0–5.0)
HEMATOCRIT: 45.5 % (ref 36.0–46.0)
Hemoglobin: 15.2 g/dL — ABNORMAL HIGH (ref 12.0–15.0)
LYMPHS ABS: 2.8 10*3/uL (ref 0.7–4.0)
LYMPHS PCT: 34 % (ref 12.0–46.0)
MCHC: 33.4 g/dL (ref 30.0–36.0)
MCV: 94.7 fl (ref 78.0–100.0)
MONOS PCT: 6.1 % (ref 3.0–12.0)
Monocytes Absolute: 0.5 10*3/uL (ref 0.1–1.0)
Neutro Abs: 4.7 10*3/uL (ref 1.4–7.7)
Neutrophils Relative %: 56.6 % (ref 43.0–77.0)
PLATELETS: 331 10*3/uL (ref 150.0–400.0)
RBC: 4.81 Mil/uL (ref 3.87–5.11)
RDW: 14 % (ref 11.5–15.5)
WBC: 8.3 10*3/uL (ref 4.0–10.5)

## 2014-07-18 LAB — LIPID PANEL
CHOL/HDL RATIO: 4
CHOLESTEROL: 173 mg/dL (ref 0–200)
HDL: 48.6 mg/dL (ref >39.00)
LDL CALC: 111 mg/dL — AB (ref 0–99)
NONHDL: 124.4
TRIGLYCERIDES: 69 mg/dL (ref 0.0–149.0)
VLDL: 13.8 mg/dL (ref 0.0–40.0)

## 2014-07-18 LAB — TSH: TSH: 1.3 u[IU]/mL (ref 0.35–4.50)

## 2014-08-28 ENCOUNTER — Telehealth: Payer: Self-pay

## 2014-08-28 NOTE — Telephone Encounter (Signed)
The patient is hoping to discuss her test results from the orthopedic dr.  She is unsure if she needs another referral, or if she needs to come in and discuss these results.  She has an appointment scheduled for 3/29.

## 2014-08-28 NOTE — Telephone Encounter (Signed)
Is she wanting a second opinion? When she comes to discuss these she will need to bring her results and other information from the visit because I do not see any info in the chart regarding this. Thanks!

## 2014-09-01 ENCOUNTER — Other Ambulatory Visit: Payer: Self-pay

## 2014-09-01 MED ORDER — CYCLOBENZAPRINE HCL 5 MG PO TABS: 5.0000 mg | ORAL_TABLET | Freq: Three times a day (TID) | ORAL | Status: DC | PRN

## 2014-09-01 NOTE — Telephone Encounter (Signed)
Noted  

## 2014-09-01 NOTE — Telephone Encounter (Signed)
[facility]il received. Patient last had medication filled 08/05/13 90 tabs with 1 additional refill. Patient last seen in office 07/09/14. Please advise

## 2014-09-02 ENCOUNTER — Ambulatory Visit (INDEPENDENT_AMBULATORY_CARE_PROVIDER_SITE_OTHER): Payer: BLUE CROSS/BLUE SHIELD | Admitting: Nurse Practitioner

## 2014-09-02 ENCOUNTER — Encounter: Payer: Self-pay | Admitting: Nurse Practitioner

## 2014-09-02 VITALS — BP 120/86 | HR 98 | Temp 98.1°F | Resp 12 | Ht 64.0 in | Wt 194.8 lb

## 2014-09-02 DIAGNOSIS — E2749 Other adrenocortical insufficiency: Secondary | ICD-10-CM

## 2014-09-02 DIAGNOSIS — M546 Pain in thoracic spine: Secondary | ICD-10-CM

## 2014-09-02 DIAGNOSIS — M797 Fibromyalgia: Secondary | ICD-10-CM

## 2014-09-02 MED ORDER — ESOMEPRAZOLE MAGNESIUM 40 MG PO CPDR: 40.0000 mg | DELAYED_RELEASE_CAPSULE | Freq: Two times a day (BID) | ORAL | Status: DC

## 2014-09-02 MED ORDER — METHYLPREDNISOLONE (PAK) 4 MG PO TABS: ORAL_TABLET | ORAL | Status: DC

## 2014-09-02 MED ORDER — VALACYCLOVIR HCL 1 G PO TABS: 1000.0000 mg | ORAL_TABLET | Freq: Two times a day (BID) | ORAL | Status: DC

## 2014-09-02 MED ORDER — TOPIRAMATE 25 MG PO TABS: 25.0000 mg | ORAL_TABLET | Freq: Two times a day (BID) | ORAL | Status: DC

## 2014-09-02 MED ORDER — ONDANSETRON 4 MG PO TBDP: 4.0000 mg | ORAL_TABLET | Freq: Three times a day (TID) | ORAL | Status: DC | PRN

## 2014-09-02 MED ORDER — LORAZEPAM 1 MG PO TABS
1.0000 mg | ORAL_TABLET | Freq: Every evening | ORAL | Status: DC | PRN
Start: 2014-09-02 — End: 2015-06-22

## 2014-09-02 MED ORDER — HYDROCODONE-ACETAMINOPHEN 5-325 MG PO TABS: ORAL_TABLET | ORAL | Status: DC

## 2014-09-02 NOTE — Progress Notes (Signed)
Subjective:    Patient ID: Brooke Rush, female    DOB: 04/08/1976, 39 y.o.   MRN: [id number]  HPI  Ms. Brooke Rush is a 39 yo female with a CC of back pain and floating ribs.   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   I have personally reviewed the X-ray at [facility] on 08/25/14 and it shows T11-T12 thoracic loss of disc height and osteophyte formation. No degeneration of lumbar spine.   Celebrex 200 mg- from Dr. Prescott Parma helpful  Pt concerned about a low cortisol level she had done at [facility] and they suggested seeing endocrinology for further information.    Review of Systems  Constitutional: Negative for fever, chills, diaphoresis and fatigue.  Respiratory: Negative for chest tightness, shortness of breath and wheezing.   Cardiovascular: Negative for chest pain, palpitations and leg swelling.  Gastrointestinal: Negative for nausea, vomiting, diarrhea and rectal pain.  Musculoskeletal: Positive for back pain.  Skin: Negative for rash.  Neurological: Negative for dizziness, weakness, numbness and headaches.  Psychiatric/Behavioral: The patient is not nervous/anxious.    Past Medical History  Diagnosis Date  . Migraines   . GERD (gastroesophageal reflux disease)   . Kidney stones   . Fibromyalgia     Diagnosed by Dr. Derrel Nip  . Plantar fasciitis     bilateral  . Constipation     2/2 pain medication    History   Social History  . Marital Status: Single    Spouse Name: N/A  . Number of Children: 3  . Years of Education: 14   Occupational History  . 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 Smoking status: Current Every Day Smoker -- 1.00 packs/day for 21 years  . Smokeless tobacco: Not on file  . Alcohol Use: 0.0 oz/week    0 Standard drinks or equivalent per week     Comment: 1-2 drinks twice a week  . Drug Use: No  . Sexual Activity:    Partners: Male     Comment:  Boyfriend   Other Topics Concern  . Not on file   Social History Narrative   Brooke Rush grew up in [place], [place]. She is living at home with her boyfriend and one of her children. She also has a 62 y/o daughter who lives with her aunt. Brooke Rush also had a baby girl that passed away in 1337 (69 days old). 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 Brooke Rush enjoys crafting on her spare time.    Past Surgical History  Procedure Laterality Date  . Cholecystectomy  2011  . Tonsillectomy and adenoidectomy  2013  . Carpal tunnel release Bilateral 1998  . Nasal sinus surgery  2011    Dr. Pryor Ochoa  . Nasal septum surgery  2011  . Hemorroidectomy      Dr. Tamala Julian    Family History  Problem Relation Age of Onset  . Arthritis Mother     Degenerative Disc Dz  . Obesity Mother   . Alcohol abuse Father   . Hyperlipidemia Father   . Heart disease Father     CAD - CABG age 20  . Kidney disease Maternal Grandmother   . Heart disease Maternal Grandmother   . Heart disease Maternal Grandfather   . Hyperlipidemia  Maternal Grandfather   . Hypertension Maternal Grandfather     No Known Allergies  Current Outpatient Prescriptions on File Prior to Visit  Medication Sig Dispense Refill  . b complex vitamins tablet Take 1 tablet by mouth daily.    . cholecalciferol (VITAMIN D) 400 UNITS TABS tablet Take 400 Units by mouth daily.    . cyclobenzaprine (FLEXERIL) 5 MG tablet Take 1 tablet (5 mg total) by mouth 3 (three) times daily as needed for muscle spasms. 90 tablet 1  . Omega-3 Fatty Acids (FISH OIL) 1000 MG CAPS Take 1,000 mg by mouth 1 day or 1 dose.    . vitamin E 1000 UNIT capsule Take 1,000 Units by mouth daily.     No current facility-administered medications on file prior to visit.      Objective:   Physical Exam  Constitutional: She is oriented to person, place, and time. She appears well-developed and  well-nourished. No distress.  BP 120/86 mmHg  Pulse 98  Temp(Src) 98.1 F (36.7 C) (Oral)  Resp 12  Ht 5\' 4"  (1.626 m)  Wt 194 lb 12.8 oz (88.361 kg)  BMI 33.42 kg/m2  SpO2 98%  LMP 09/02/2014 (Approximate)   HENT:  Head: Normocephalic and atraumatic.  Right Ear: External ear normal.  Left Ear: External ear normal.  Cardiovascular: Normal rate, regular rhythm, normal heart sounds and intact distal pulses.  Exam reveals no gallop and no friction rub.   No murmur heard. Pulmonary/Chest: Effort normal and breath sounds normal. No respiratory distress. She has no wheezes. She has no rales. She exhibits no tenderness.  Musculoskeletal: She exhibits tenderness.  Tender to palpation of thoracic spine in the area of T11-T12 and paraspinally  Neurological: She is alert and oriented to person, place, and time. No cranial nerve deficit. She exhibits normal muscle tone. Coordination normal.  Skin: Skin is warm and dry. No rash noted. She is not diaphoretic.  Psychiatric: She has a normal mood and affect. Her behavior is normal. Judgment and thought content normal.      Assessment & Plan:

## 2014-09-02 NOTE — Patient Instructions (Signed)
We will call you about your referral to Endocrinology.   See you in 4 weeks to follow up for back pain.

## 2014-09-02 NOTE — Progress Notes (Signed)
Pre visit review using our clinic review tool, if applicable. No additional management support is needed unless otherwise documented below in the visit note. 

## 2014-09-04 ENCOUNTER — Encounter: Payer: Self-pay | Admitting: Nurse Practitioner

## 2014-09-06 NOTE — Assessment & Plan Note (Addendum)
Low cortisol level will be discussed with endocrinology. Referral placed.  Refill of Topamax, Valtrex, Zofran, Norco, and Nexium.

## 2014-09-06 NOTE — Assessment & Plan Note (Signed)
Personally reviewed x-ray - findings in HPI. Discussed next possible steps and she will try a Medrol dosepack for decreasing inflammation. Next step referral for ortho or neurosurgery consult.

## 2014-09-08 ENCOUNTER — Telehealth: Payer: Self-pay

## 2014-09-08 NOTE — Telephone Encounter (Signed)
Returned pt and [facility] call in regards to needing more information before filling Rx received on 3/28-3/29.  Medrol dospack, Topamax 25mg , Nexium 40mg , Valtrex 1000mg , Zofran ODT 4mg , and Flexeril 5mg .  Rx now filled.  Patient says she thought she was supposed to have Topamax 100mg  and would like to pick up written script for Hydrocodone.  Please advise.

## 2014-09-09 ENCOUNTER — Telehealth: Payer: Self-pay

## 2014-09-09 ENCOUNTER — Other Ambulatory Visit: Payer: Self-pay | Admitting: Nurse Practitioner

## 2014-09-09 DIAGNOSIS — Z760 Encounter for issue of repeat prescription: Secondary | ICD-10-CM

## 2014-09-09 MED ORDER — HYDROCODONE-ACETAMINOPHEN 5-325 MG PO TABS: ORAL_TABLET | ORAL | Status: DC

## 2014-09-09 NOTE — Telephone Encounter (Signed)
Called patient and made her aware of all medications except Hydrocodone is being filled via mail order.  Hydrocodone is ready to be picked up at the front desk.  Pt verbalized understanding.

## 2014-09-10 ENCOUNTER — Telehealth: Payer: Self-pay

## 2014-09-10 ENCOUNTER — Other Ambulatory Visit: Payer: Self-pay | Admitting: Nurse Practitioner

## 2014-09-10 NOTE — Telephone Encounter (Signed)
Spoke with patient about Topamax medication.  Pt says she has increased her medication from 25mg  to 75mg  and would like to increase to 100mg .  Pt was originally dispensed 21 tabs on.  Please advise.

## 2014-09-18 ENCOUNTER — Ambulatory Visit (INDEPENDENT_AMBULATORY_CARE_PROVIDER_SITE_OTHER): Payer: BLUE CROSS/BLUE SHIELD | Admitting: Endocrinology

## 2014-09-18 ENCOUNTER — Encounter: Payer: Self-pay | Admitting: Endocrinology

## 2014-09-18 VITALS — BP 124/76 | HR 103 | Resp 12 | Ht 64.0 in | Wt 188.2 lb

## 2014-09-18 DIAGNOSIS — E2740 Unspecified adrenocortical insufficiency: Secondary | ICD-10-CM

## 2014-09-18 DIAGNOSIS — R5383 Other fatigue: Secondary | ICD-10-CM

## 2014-09-18 DIAGNOSIS — R7989 Other specified abnormal findings of blood chemistry: Secondary | ICD-10-CM

## 2014-09-18 LAB — HEMOGLOBIN A1C: Hgb A1c MFr Bld: 5.6 % (ref 4.6–6.5)

## 2014-09-18 LAB — VITAMIN D 25 HYDROXY (VIT D DEFICIENCY, FRACTURES): VITD: 21.41 ng/mL — ABNORMAL LOW (ref 30.00–100.00)

## 2014-09-18 NOTE — Progress Notes (Signed)
Pre visit review using our clinic review tool, if applicable. No additional management support is needed unless otherwise documented below in the visit note. 

## 2014-09-18 NOTE — Assessment & Plan Note (Signed)
Discussed about adrenal insufficiency and symptoms, signs and workup. 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 The recent level of cortisol was taken at the end of her shift, and could be low secondary to timing of the day.  Currently, she is on prednisone taper and will finish it in another 3-4 days. She doesn't appear to be particularly symptomatic from low cortisol at this time, BP and lytes are normal range. Will ask her to wait for another 6 weeks and repeat morning cortisol levels, fasting , between 8-9am, on Thursday am ( first day that she gets back to work in a week).   In the interim, will have her observe symptoms.   Given her aches and that she is not on Vitamin D, will screen for Vitamin d deficiency.  Given her hx GDM, weight gain will screen with A1c, Return for fasting glucose at time of next lab draw.

## 2014-09-18 NOTE — Progress Notes (Signed)
Patient ID: Brooke Rush, female   DOB: 1976-03-02, 39 y.o.   MRN: [id number]   HPI: Brooke Rush is a 39 y.o. female referred by her PCP, Doss, Velora Heckler, NP, for evaluation of low cortisol level checked recently.   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 This pain continued and was worse and different than her usual amount of pain from Fibro, hence she was evaluated by her orthopedic physician in March at [facility], arthritis panel was normal, but cortisol was low at 5.7 on 08/25/14 taken non fasting after 9am. Patient had worked the night shift the night prior, eaten her meal at end of shift and gone for an appointment that day. She usually works 4 days weekly from Thursday through Sunday ( 7pm to 7am). The back pain continues and she was given a 5 day medrol dose pack by her PCP; she started this yesterday.   Prior to this she visited the [facility] through [facility]- was given Toradol injection 07/27/14. Steroid taper given to patient, but never filled at that time. Denies any other steroid use recently, no ESI.   ROS is - C/o Fatigue since last 4 years along with muscle aches and point tenderness Fibro dxed 2006- Carrie managing that Denies low BP and fainting , but recalls one episode of near syncope in the past Does get [facility] after long shifts Tends to get nausea with migraines, extreme pain or constipation No salt craving No skin hyperpigmentation Hx GDM 2012 was on metformin at that time, baby later passed away, had lost weight during grieving, but has put on more weight since then. Random sugars recently have been in the 100 range. Does get symptoms of shakiness postprandially occasionally , followed by increased hunger and need to eat something sweet   FH DM in mom, MGF  Reviewed recent labs for CMP, TSH, done 07/2014 Recent lytes normal.   Lab Results  Component Value Date   NA 138 07/18/2014   Lab Results  Component Value Date   K 4.3  07/18/2014   Lab Results  Component Value Date   TSH 1.30 07/18/2014   Lab Results  Component Value Date   GLUCOSE 108* 07/18/2014     I have reviewed the patient's past medical history, family and social history, surgical history, medications and allergies.  Past Medical History  Diagnosis Date  . Migraines   . GERD (gastroesophageal reflux disease)   . Kidney stones   . Fibromyalgia     Diagnosed by Dr. Derrel Nip  . Plantar fasciitis     bilateral  . Constipation     2/2 pain medication   Past Surgical History  Procedure Laterality Date  . Cholecystectomy  2011  . Tonsillectomy and adenoidectomy  2013  . Carpal tunnel release Bilateral 1998  . Nasal sinus surgery  2011    Dr. Pryor Ochoa  . Nasal septum surgery  2011  . Hemorroidectomy      Dr. Tamala Julian   Family History  Problem Relation Age of Onset  . Arthritis Mother     Degenerative Disc Dz  . Obesity Mother   . Alcohol abuse Father   . Hyperlipidemia Father   . Heart disease Father     CAD - CABG age 87  . Kidney disease Maternal Grandmother   . Heart disease Maternal Grandmother   . Heart disease Maternal Grandfather   . Hyperlipidemia Maternal Grandfather   .  Hypertension Maternal Grandfather    History   Social History  . Marital Status: Single    Spouse Name: N/A  . Number of Children: 3  . Years of Education: 14   Occupational History  . CNA for [employer]   Social History Main Topics  . Smoking status: Current Every Day Smoker -- 1.00 packs/day for 21 years  . Smokeless tobacco: Not on file  . Alcohol Use: 0.0 oz/week    0 Standard drinks or equivalent per week     Comment: 1-2 drinks twice a week  . Drug Use: No  . Sexual Activity:    Partners: Male     Comment: Boyfriend   Other Topics Concern  . Not on file   Social History Narrative   Brooke Rush grew up in [place], [place]. She is living at home with her boyfriend and one of her children. She also has  a 63 y/o daughter who lives with her aunt. Brooke Rush also had a baby girl that passed away in [id number] (48 days old). 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 Brooke Rush enjoys crafting on her spare time.   Current Outpatient Prescriptions on File Prior to Visit  Medication Sig Dispense Refill  . b complex vitamins tablet Take 1 tablet by mouth daily.    . cholecalciferol (VITAMIN D) 400 UNITS TABS tablet Take 400 Units by mouth daily.    . cyclobenzaprine (FLEXERIL) 5 MG tablet Take 1 tablet (5 mg total) by mouth 3 (three) times daily as needed for muscle spasms. 90 tablet 1  . esomeprazole (NEXIUM) 40 MG capsule Take 1 capsule (40 mg total) by mouth 2 (two) times daily. 180 capsule 1  . HYDROcodone-acetaminophen (NORCO/VICODIN) 5-325 MG per tablet Take 1 tablet once a day as needed for pain 30 tablet 0  . LORazepam (ATIVAN) 1 MG tablet Take 1 tablet (1 mg total) by mouth at bedtime as needed for anxiety. 90 tablet 1  . methylPREDNIsolone (MEDROL DOSPACK) 4 MG tablet follow package directions 21 tablet 0  . Omega-3 Fatty Acids (FISH OIL) 1000 MG CAPS Take 1,000 mg by mouth 1 day or 1 dose.    . ondansetron (ZOFRAN ODT) 4 MG disintegrating tablet Take 1 tablet (4 mg total) by mouth every 8 (eight) hours as needed for nausea or vomiting. 20 tablet 0  . valACYclovir (VALTREX) 1000 MG tablet Take 1 tablet (1,000 mg total) by mouth 2 (two) times daily. 90 tablet 0  . vitamin E 1000 UNIT capsule Take 1,000 Units by mouth daily.    . [DISCONTINUED] topiramate (TOPAMAX) 25 MG tablet Take 1 tablet (25 mg total) by mouth 2 (two) times daily. 21 tablet 0   No current facility-administered medications on file prior to visit.   No Known Allergies  Review of Systems: [x]  complains of  [  ] denies General:   [  ] Recent weight change [ x ] Fatigue  [  ] Loss of appetite Eyes: [  ]  Vision Difficulty [  ]  Eye pain ENT: [  x]  Hearing  difficulty-only for certain tone [  ]  Difficulty Swallowing CVS: [  ] Chest pain [ x ]  Palpitations/Irregular Heart beat [  x]  Shortness of breath lying flat [ x ] Swelling of legs-after night shifts Resp: [  ]  Frequent Cough [  ] Shortness of Breath  [  ]  Wheezing GI: [ x ] Heartburn  [ x ] Nausea or Vomiting  [  ] Diarrhea [ x ] Constipation  [  x] Abdominal Pain GU: [ x ]  Polyuria  [  ]  nocturia Bones/joints:  [ x ]  Muscle aches  [ x ] Joint Pain  [ x ] Bone pain Skin/Hair/Nails: [ x ]  Rash-dermatographia  [  ] New stretch marks [ x ]  Itching [ x ] Hair loss [  ]  Excessive hair growth Reproduction: [  x] Low sexual desire , [  x]  Women: Menstrual cycle problems [  ]  Women: Breast Discharge [  ] Men: Difficulty with erections [  ]  Men: Enlarged Breasts CNS: [ x ] Frequent Headaches [  ] Blurry vision [  ] Tremors [  ] Seizures [  ] Loss of consciousness [ x ] Localized weakness Endocrine: [  x]  Excess thirst [  ]  Feeling excessively hot [  ]  Feeling excessively cold Heme: [ x ]  Easy bruising [  ]  Enlarged glands or lumps in neck Allergy: [  ]  Food allergies [ x ] Environmental allergies  Physical exam: Filed Vitals:   09/18/14 0812  BP: 124/76  Pulse: 103  Resp: 12   Body mass index is 32.3 kg/(m^2). HEENT: [place]/AT, EOMI, no icterus, no proptosis, no chemosis, no mild lid lag, no retraction, eyes close completely Neck: thyroid gland - smooth, non-tender, no erythema, no tracheal deviation; negative Pemberton's sign; no lymphadenopathy; no bruits, no acanthosis Lungs: good air entry, clear bilaterally Heart: S1&S2 normal, regular rate & rhythm; no murmurs, rubs or gallops Abd: soft, NT, ND, no HSM, +BS, faint pink straie Ext: no tremor in hands bilaterally, no edema, 2+ DP/PT pulses, good muscle mass Neuro: normal gait, 2+ reflexes bilaterally, normal 5/5 strength, no proximal myopathy  Derm: no pretibial myxoedema/skin dryness, no skin  hyperpigmentation  Assessment/Plan: 1. Fatigue 2. Low cortisol level  Problem List Items Addressed This Visit      Other   Fatigue - Primary    Discussed about adrenal insufficiency and symptoms, signs and workup. 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 The recent level of cortisol was taken at the end of her shift, and could be low secondary to timing of the day.  Currently, she is on prednisone taper and will finish it in another 3-4 days. She doesn't appear to be particularly symptomatic from low cortisol at this time, BP and lytes are normal range. Will ask her to wait for another 6 weeks and repeat morning cortisol levels, fasting , between 8-9am, on Thursday am ( first day that she gets back to work in a week).   In the interim, will have her observe symptoms.   Given her aches and that she is not on Vitamin D, will screen for Vitamin d deficiency.  Given her hx GDM, weight gain will screen with A1c, Return for fasting glucose at time of next lab draw.      Relevant Orders   Vit D  25 hydroxy (rtn osteoporosis monitoring) (Completed)   Hemoglobin A1c (Completed)    Other Visit Diagnoses    Low serum cortisol level          -RTC 2 months  Brooke Rush [facility] 09/18/2014 1:30 PM

## 2014-09-18 NOTE — Patient Instructions (Signed)
  Labs today.  In another 6 weeks, please come for morning labs on Thursday am between 8-9 am, fasting for morning cortisol, ACTH and fasting glucose testing ( provided that you dont get any other steroids including any injections/creams/inhalers).  Watch over diet and try to exercise/stretching  Please come back for a follow-up appointment in 2 months.

## 2014-09-23 NOTE — Op Note (Signed)
PATIENT NAME:  Brooke Rush, Brooke Rush MR#:  [id number] DATE OF BIRTH:  07/16/1975  DATE OF PROCEDURE:  02/13/2012  PREOPERATIVE DIAGNOSES:  1. Eustachian tube dysfunction.  2. History of chronic otitis media.   POSTOPERATIVE DIAGNOSES:  1. Eustachian tube dysfunction.  2. History of chronic otitis media.   PROCEDURE PERFORMED: Bilateral myringotomy and tympanostomy tube placement with butterfly tubes.   SURGEON: Jerene Bears, MD  ANESTHESIA: General mask anesthesia.   ESTIMATED BLOOD LOSS: Zero.   IV FLUIDS: Please see anesthesia record.   COMPLICATIONS: None.   DRAINS/STENT PLACEMENTS: Bilateral butterfly tubes.   SPECIMENS: None.   INDICATIONS FOR PROCEDURE: Patient is a 39 year old female with history of chronic eustachian tube dysfunction with history of tube placement in the past and tube extrusion and recurrence of symptoms presenting for butterfly tube placed under general anesthesia.   OPERATIVE FINDINGS: Bilateral atelectatic drums with butterfly PE tubes placed in anterior/inferior aspect of the right ear and the posterior/inferior aspect of the left.   DESCRIPTION OF PROCEDURE: After the patient was identified in holding, the benefits and risks of the procedure were discussed and consent was reviewed. Patient was taken to the Operating Room, placed in supine position. General mask anesthesia was induced. The operating microscope was brought onto the field. Appropriate size speculum was placed in patient's right external auditory canal. Tympanic membrane was visualized, noted to be mildly retracted and atelectatic. A myringotomy was placed in anterior/inferior aspect of tympanic membrane and alligator forceps were used to place a butterfly tube through the myringotomy site and Ciprodex drops were instilled. Attention was directed to patient's left ear. In a similar fashion, left ear was visualized under binocular microscopy. Also noted to be mildly retracted and atelectatic.  A myringotomy was placed in the posterior/inferior aspect of the tympanic membrane and alligator forceps were used to placed the tube through the myringotomy site. Ciprodex drops were instilled and care of the patient was transferred to anesthesia where patient was taken to PAC-U in good condition.   ____________________________ Jerene Bears, MD ccv:cms D: 02/13/2012 07:37:51 ET T: 02/13/2012 11:26:08 ET JOB#: [id number]  cc: Jerene Bears, MD, <Dictator>  Jerene Bears MD ELECTRONICALLY SIGNED 02/27/2012 7:17

## 2014-10-06 ENCOUNTER — Ambulatory Visit: Payer: BLUE CROSS/BLUE SHIELD | Admitting: Nurse Practitioner

## 2014-10-20 ENCOUNTER — Ambulatory Visit: Payer: BLUE CROSS/BLUE SHIELD | Admitting: Nurse Practitioner

## 2014-10-28 ENCOUNTER — Ambulatory Visit: Payer: BLUE CROSS/BLUE SHIELD | Admitting: Nurse Practitioner

## 2014-11-01 ENCOUNTER — Encounter: Payer: Self-pay | Admitting: Endocrinology

## 2014-11-04 ENCOUNTER — Other Ambulatory Visit: Payer: Self-pay | Admitting: Endocrinology

## 2014-11-04 ENCOUNTER — Ambulatory Visit: Payer: BLUE CROSS/BLUE SHIELD | Admitting: Nurse Practitioner

## 2014-11-04 DIAGNOSIS — R5383 Other fatigue: Secondary | ICD-10-CM

## 2014-11-13 ENCOUNTER — Encounter: Payer: Self-pay | Admitting: Nurse Practitioner

## 2014-11-13 ENCOUNTER — Ambulatory Visit (INDEPENDENT_AMBULATORY_CARE_PROVIDER_SITE_OTHER): Payer: BLUE CROSS/BLUE SHIELD | Admitting: Nurse Practitioner

## 2014-11-13 ENCOUNTER — Encounter: Payer: Self-pay | Admitting: Endocrinology

## 2014-11-13 VITALS — BP 108/70 | HR 94 | Temp 97.9°F | Resp 12 | Ht 64.0 in | Wt 192.1 lb

## 2014-11-13 DIAGNOSIS — R5383 Other fatigue: Secondary | ICD-10-CM | POA: Diagnosis not present

## 2014-11-13 DIAGNOSIS — M797 Fibromyalgia: Secondary | ICD-10-CM | POA: Diagnosis not present

## 2014-11-13 LAB — VITAMIN D 25 HYDROXY (VIT D DEFICIENCY, FRACTURES): VITD: 35.64 ng/mL (ref 30.00–100.00)

## 2014-11-13 LAB — BASIC METABOLIC PANEL
BUN: 12 mg/dL (ref 6–23)
CALCIUM: 9.8 mg/dL (ref 8.4–10.5)
CO2: 28 meq/L (ref 19–32)
CREATININE: 0.75 mg/dL (ref 0.40–1.20)
Chloride: 102 mEq/L (ref 96–112)
GFR: 92.2 mL/min (ref >60.00)
Glucose, Bld: 98 mg/dL (ref 70–99)
POTASSIUM: 5.4 meq/L — AB (ref 3.5–5.1)
Sodium: 137 mEq/L (ref 135–145)

## 2014-11-13 LAB — CORTISOL: Cortisol, Plasma: 7.2 ug/dL

## 2014-11-13 NOTE — Patient Instructions (Signed)
See me in 1 month for gyn exam and medications.

## 2014-11-13 NOTE — Progress Notes (Signed)
   Subjective:    Patient ID: Brooke Rush, female    DOB: [birth year], 39 y.o.   MRN: [id number]  HPI  Ms. Wynetta Emery is a 39 yo female with a follow up of her endocrinology visit and fell 1 week ago.   1) Massage yesterday Chiropractor today- neck pain   2) H/o nasal MRSA- Saw ENT, has tubes in ear bilaterally, bactroban in nose   Wt Readings from Last 3 Encounters:  11/13/14 192 lb 1.9 oz (87.145 kg)  09/18/14 188 lb 4 oz (85.39 kg)  09/02/14 194 lb 12.8 oz (88.361 kg)    Review of Systems  Constitutional: Negative for fever, chills, diaphoresis and fatigue.  Respiratory: Negative for chest tightness, shortness of breath and wheezing.   Cardiovascular: Negative for chest pain, palpitations and leg swelling.  Gastrointestinal: Negative for nausea, vomiting and diarrhea.  Musculoskeletal: Positive for back pain, arthralgias and neck pain.  Skin: Negative for rash.  Neurological: Negative for dizziness, weakness, numbness and headaches.  Psychiatric/Behavioral: The patient is not nervous/anxious.       Objective:   Physical Exam  Constitutional: She is oriented to person, place, and time. She appears well-developed and well-nourished. No distress.  BP 108/70 mmHg  Pulse 94  Temp(Src) 97.9 F (36.6 C) (Oral)  Resp 12  Ht 5\' 4"  (1.626 m)  Wt 192 lb 1.9 oz (87.145 kg)  BMI 32.96 kg/m2  SpO2 97%  LMP 10/26/2014   HENT:  Head: Normocephalic and atraumatic.  Right Ear: External ear normal.  Left Ear: External ear normal.  Cardiovascular: Normal rate, regular rhythm, normal heart sounds and intact distal pulses.  Exam reveals no gallop and no friction rub.   No murmur heard. Pulmonary/Chest: Effort normal and breath sounds normal. No respiratory distress. She has no wheezes. She has no rales. She exhibits no tenderness.  Neurological: She is alert and oriented to person, place, and time. No cranial nerve deficit. She exhibits normal muscle tone. Coordination normal.  Skin:  Skin is warm and dry. No rash noted. She is not diaphoretic.  Psychiatric: She has a normal mood and affect. Her behavior is normal. Judgment and thought content normal.      Assessment & Plan:

## 2014-11-13 NOTE — Progress Notes (Signed)
Pre visit review using our clinic review tool, if applicable. No additional management support is needed unless otherwise documented below in the visit note. 

## 2014-11-14 ENCOUNTER — Telehealth: Payer: Self-pay | Admitting: *Deleted

## 2014-11-14 ENCOUNTER — Other Ambulatory Visit: Payer: BLUE CROSS/BLUE SHIELD

## 2014-11-14 NOTE — Telephone Encounter (Signed)
Lets do this- get ACTH +baseline cortisol at time 0, prior to the ACTH injection and then cortisol at 30 min and 60 min post injection. Schedule test between 8-9 am. thanks

## 2014-11-14 NOTE — Telephone Encounter (Signed)
[facility] called say that the specimen for the [facility] thawed out when they started test it and they need another sample , i called this number [phone] but that number isnt working

## 2014-11-14 NOTE — Telephone Encounter (Signed)
The cortisol level resulted...does that mean it wasn't accurate?

## 2014-11-14 NOTE — Telephone Encounter (Signed)
The cortisol was sent to our lab and the [facility] was sent [facility], two different tubes. So the resulted is accurate for the cortisol

## 2014-11-14 NOTE — Telephone Encounter (Signed)
Looks like order had been canceled in Epic, did you need ACTH? In process of scheduling pt for ACTH stimulation test

## 2014-11-17 ENCOUNTER — Other Ambulatory Visit: Payer: Self-pay | Admitting: Nurse Practitioner

## 2014-11-17 MED ORDER — VARENICLINE TARTRATE 0.5 MG X 11 & 1 MG X 42 PO MISC: ORAL | Status: DC

## 2014-11-18 ENCOUNTER — Other Ambulatory Visit: Payer: Self-pay | Admitting: Endocrinology

## 2014-11-18 DIAGNOSIS — R5383 Other fatigue: Secondary | ICD-10-CM

## 2014-11-18 NOTE — Telephone Encounter (Signed)
They are in the system. thanks

## 2014-11-18 NOTE — Telephone Encounter (Signed)
Pt has scheduled appt 11/20/14

## 2014-11-20 ENCOUNTER — Encounter: Payer: Self-pay | Admitting: Endocrinology

## 2014-11-20 ENCOUNTER — Other Ambulatory Visit (INDEPENDENT_AMBULATORY_CARE_PROVIDER_SITE_OTHER): Payer: BLUE CROSS/BLUE SHIELD

## 2014-11-20 ENCOUNTER — Ambulatory Visit (INDEPENDENT_AMBULATORY_CARE_PROVIDER_SITE_OTHER): Payer: BLUE CROSS/BLUE SHIELD | Admitting: *Deleted

## 2014-11-20 ENCOUNTER — Ambulatory Visit (INDEPENDENT_AMBULATORY_CARE_PROVIDER_SITE_OTHER): Payer: BLUE CROSS/BLUE SHIELD | Admitting: Endocrinology

## 2014-11-20 VITALS — BP 110/78 | HR 94 | Resp 12 | Ht 64.0 in | Wt 192.5 lb

## 2014-11-20 DIAGNOSIS — E2740 Unspecified adrenocortical insufficiency: Secondary | ICD-10-CM | POA: Diagnosis not present

## 2014-11-20 DIAGNOSIS — R7989 Other specified abnormal findings of blood chemistry: Secondary | ICD-10-CM

## 2014-11-20 DIAGNOSIS — R5383 Other fatigue: Secondary | ICD-10-CM | POA: Diagnosis not present

## 2014-11-20 LAB — BASIC METABOLIC PANEL
BUN: 6 mg/dL (ref 6–23)
CHLORIDE: 107 meq/L (ref 96–112)
CO2: 26 mEq/L (ref 19–32)
Calcium: 9.3 mg/dL (ref 8.4–10.5)
Creatinine, Ser: 0.64 mg/dL (ref 0.40–1.20)
GFR: 110.71 mL/min (ref >60.00)
GLUCOSE: 121 mg/dL — AB (ref 70–99)
POTASSIUM: 4.6 meq/L (ref 3.5–5.1)
SODIUM: 138 meq/L (ref 135–145)

## 2014-11-20 LAB — CORTISOL
CORTISOL PLASMA: 21.1 ug/dL
Cortisol, Plasma: 3.7 ug/dL
Cortisol, Plasma: 25.2 ug/dL

## 2014-11-20 MED ORDER — COSYNTROPIN 0.25 MG IJ SOLR: 0.2500 mg | Freq: Once | INTRAMUSCULAR | Status: DC

## 2014-11-20 MED ORDER — COSYNTROPIN 0.25 MG IJ SOLR
0.2500 mg | Freq: Once | INTRAMUSCULAR | Status: AC
  Administered 2014-11-20: 0.25 mg via INTRAMUSCULAR

## 2014-11-20 NOTE — Progress Notes (Signed)
Pre visit review using our clinic review tool, if applicable. No additional management support is needed unless otherwise documented below in the visit note. 

## 2014-11-20 NOTE — Patient Instructions (Signed)
Keep monitoring your symptoms. Await labs from today.   Please come back for a follow-up appointment in 2 months [place]

## 2014-11-20 NOTE — Assessment & Plan Note (Signed)
Discussed about adrenal insufficiency and symptoms, signs and workup. 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 Recent baseline cortisol was low. She is here for ACTH stimulation test today. No recent steroids since April.  If test is abnormal, then we discussed about possibly starting low dose hydrocortisone trial. If ACTH elevated, then she will also need florinef.  Also, I wonder how much the chronic opiate therapy is contributing to her cortisol levels.    She doesn't appear to be particularly symptomatic from low cortisol at this time, BP and lytes are mostly in normal range. In the interim, will have her observe symptoms.

## 2014-11-20 NOTE — Progress Notes (Signed)
HPI: Brooke Rush is a 39 y.o. female referred by her PCP, Doss, Velora Heckler, NP, for evaluation of low cortisol level checked recently.  Last visit April 2016.  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 This pain continued and was worse and different than her usual amount of pain from Fibro, hence she was evaluated by her orthopedic physician in March at [facility], arthritis panel was normal, but cortisol was low at 5.7 on 08/25/14 taken non fasting after 9am. Patient had worked the night shift the night prior, eaten her meal at end of shift and gone for an appointment that day. She usually works 4 days weekly from Thursday through Sunday ( 7pm to 7am). The back pain continues and she was given a 5 day medrol dose pack by her PCP; she took this in April 2016.   Prior to this she visited the [place] in clinic through [facility]- was given Toradol injection 07/27/14. Steroid taper given to patient, but never filled at that time. Denies any other steroid use recently, no ESI.   ROS is - C/o Fatigue since last 4 years along with muscle aches and point tenderness Fibro dxed 2006- Carrie managing that Denies low BP and fainting , but recalls one episode of near syncope in the past and now recently, BP was 100/48 Does get LH after long shifts Tends to get nausea with migraines, extreme pain or constipation Mild salt craving that has been noticed by her in retrospect No skin hyperpigmentation Hx GDM 2012 was on metformin at that time, baby later passed away, had lost weight during grieving, but has put on more weight since then. Random sugars recently have been in the 100 range. Does get symptoms of shakiness postprandially occasionally , followed by increased hunger and need to eat something sweet   FH DM in mom, MGF Now on 2000 units OTC Vitamin D daily, feeling slightly better Is also on Norco/Vicodin chronic Does report some easy bruising - recent PLT normal Recent  baseline cortisol in the morning was low at 7.2 in June 2016  Reviewed recent labs for CMP, TSH, done 07/2014 Recent lytes normal, except this last time, when K was slightly abnormal.   Lab Results  Component Value Date   NA 137 11/13/2014   Lab Results  Component Value Date   K 5.4* 11/13/2014   Lab Results  Component Value Date   TSH 1.30 07/18/2014   Lab Results  Component Value Date   GLUCOSE 98 11/13/2014     I have reviewed the patient's past medical history, family and social history, surgical history, medications and allergies.  Past Medical History  Diagnosis Date  . Migraines   . GERD (gastroesophageal reflux disease)   . Kidney stones   . Fibromyalgia     Diagnosed by Dr. Derrel Nip  . Plantar fasciitis     bilateral  . Constipation     2/2 pain medication   Past Surgical History  Procedure Laterality Date  . Cholecystectomy  2011  . Tonsillectomy and adenoidectomy  2013  . Carpal tunnel release Bilateral 1998  . Nasal sinus surgery  2011    Dr. Pryor Ochoa  . Nasal septum surgery  2011  . Hemorroidectomy      Dr. Tamala Julian   Family History  Problem Relation Age of Onset  . Arthritis Mother     Degenerative Disc Dz  . Obesity Mother   . Alcohol abuse  Father   . Hyperlipidemia Father   . Heart disease Father     CAD - CABG age 60  . Kidney disease Maternal Grandmother   . Heart disease Maternal Grandmother   . Heart disease Maternal Grandfather   . Hyperlipidemia Maternal Grandfather   . Hypertension Maternal Grandfather    History   Social History  . Marital Status: Single    Spouse Name: N/A  . Number of Children: 3  . 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 Smoking status: Current Every Day Smoker -- 1.00 packs/day for 21 years  . Smokeless tobacco: Not on file  . Alcohol Use: 0.0 oz/week    0 Standard drinks or equivalent per week     Comment:  1-2 drinks twice a week  . Drug Use: No  . Sexual Activity:    Partners: Male     Comment: Boyfriend   Other Topics Concern  . Not on file   Social History Narrative   Brooke Rush grew up in [place], [place]. She is living at home with her boyfriend and one of her children. She also has a 19 y/o daughter who lives with her aunt. Rainah also had a baby girl that passed away in 2082 (42 days old). 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 Parrish enjoys crafting on her spare time.   Current Outpatient Prescriptions on File Prior to Visit  Medication Sig Dispense Refill  . b complex vitamins tablet Take 1 tablet by mouth daily.    . cholecalciferol (VITAMIN D) 1000 UNITS tablet Take 2,000 Units by mouth daily.    . cyclobenzaprine (FLEXERIL) 5 MG tablet Take 1 tablet (5 mg total) by mouth 3 (three) times daily as needed for muscle spasms. 90 tablet 1  . esomeprazole (NEXIUM) 40 MG capsule Take 1 capsule (40 mg total) by mouth 2 (two) times daily. 180 capsule 1  . HYDROcodone-acetaminophen (NORCO/VICODIN) 5-325 MG per tablet Take 1 tablet once a day as needed for pain 30 tablet 0  . LORazepam (ATIVAN) 1 MG tablet Take 1 tablet (1 mg total) by mouth at bedtime as needed for anxiety. 90 tablet 1  . Omega-3 Fatty Acids (FISH OIL) 1000 MG CAPS Take 1,000 mg by mouth 1 day or 1 dose.    . ondansetron (ZOFRAN ODT) 4 MG disintegrating tablet Take 1 tablet (4 mg total) by mouth every 8 (eight) hours as needed for nausea or vomiting. 20 tablet 0  . valACYclovir (VALTREX) 1000 MG tablet Take 1 tablet (1,000 mg total) by mouth 2 (two) times daily. 90 tablet 0  . vitamin E 1000 UNIT capsule Take 1,000 Units by mouth daily.    . varenicline (CHANTIX STARTING MONTH PAK) 0.5 MG X 11 & 1 MG X 42 tablet Take one 0.5 mg tablet by mouth once daily for 3 days, then increase to one 0.5 mg tablet twice daily for 4 days, then increase to one 1  mg tablet twice daily. (Patient not taking: Reported on 11/20/2014) 53 tablet 0  . [DISCONTINUED] topiramate (TOPAMAX) 25 MG tablet Take 1 tablet (25 mg total) by mouth 2 (two) times daily. 21 tablet 0   No current facility-administered medications on file prior to visit.   No Known Allergies  Physical exam: Filed Vitals:   11/20/14 1023  BP: 110/78  Pulse: 94  Resp: 12   Body mass index is 33.03 kg/(m^2). HEENT: [place]/AT, EOMI, no icterus, no proptosis, no chemosis, no mild lid lag, no retraction, eyes close completely Neck: thyroid gland - smooth, non-tender, no erythema, no tracheal deviation; negative Pemberton's sign; no lymphadenopathy; no bruits, no acanthosis Lungs: good air entry, clear bilaterally Heart: S1&S2 normal, regular rate & rhythm; no murmurs, rubs or gallops Ext: no tremor in hands bilaterally, no edema, 2+ DP/PT pulses, good muscle mass Neuro: normal gait, 2+ reflexes bilaterally, normal 5/5 strength, no proximal myopathy  Derm: no pretibial myxoedema/skin dryness, no skin hyperpigmentation  Assessment/Plan: 1. Fatigue 2. Low cortisol level  Problem List Items Addressed This Visit      Other   Fatigue - Primary    Discussed about adrenal insufficiency and symptoms, signs and workup. 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 Recent baseline cortisol was low. She is here for ACTH stimulation test today. No recent steroids since April.  If test is abnormal, then we discussed about possibly starting low dose hydrocortisone trial. If ACTH elevated, then she will also need florinef.  Also, I wonder how much the chronic opiate therapy is contributing to her cortisol levels.    She doesn't appear to be particularly symptomatic from low cortisol at this time, BP and lytes are mostly in normal range. In the interim, will have her observe symptoms.            -RTC 2 months Explained that I am transferring out of State ,  and she has elected to follow up with my colleagues at [facility].   Azhane Eckart [facility] 11/20/2014 1:09 PM

## 2014-11-20 NOTE — Progress Notes (Signed)
Pt presents for ACTH stimulation test. Blood drawn at 10:00, cortrosyn given IM, see documentation. Labs will also be drawn at 10:30 and 11:00.

## 2014-11-20 NOTE — Addendum Note (Signed)
Addended by: Karlene Einstein D on: 11/20/2014 10:59 AM   Modules accepted: Orders

## 2014-11-21 ENCOUNTER — Encounter: Payer: Self-pay | Admitting: *Deleted

## 2014-11-21 NOTE — Telephone Encounter (Signed)
Result note sent to Dr. Howell Rucks

## 2014-11-23 NOTE — Assessment & Plan Note (Signed)
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

## 2014-11-23 NOTE — Assessment & Plan Note (Signed)
See Dr. Boyd Kerbs note from 6/16 regarding her visit.

## 2014-11-24 LAB — ACTH: C206 ACTH: 9 pg/mL (ref 6–50)

## 2014-12-11 ENCOUNTER — Ambulatory Visit (INDEPENDENT_AMBULATORY_CARE_PROVIDER_SITE_OTHER): Payer: BLUE CROSS/BLUE SHIELD | Admitting: Nurse Practitioner

## 2014-12-11 ENCOUNTER — Other Ambulatory Visit (HOSPITAL_COMMUNITY)
Admission: RE | Admit: 2014-12-11 | Discharge: 2014-12-11 | Disposition: A | Discharge status: Home / Self Care | Payer: BLUE CROSS/BLUE SHIELD | Source: Ambulatory Visit | Attending: Nurse Practitioner | Admitting: Nurse Practitioner

## 2014-12-11 VITALS — BP 98/70 | HR 109 | Temp 98.2°F | Resp 16 | Ht 64.0 in | Wt 187.4 lb

## 2014-12-11 DIAGNOSIS — D179 Benign lipomatous neoplasm, unspecified: Secondary | ICD-10-CM | POA: Diagnosis not present

## 2014-12-11 DIAGNOSIS — Z01419 Encounter for gynecological examination (general) (routine) without abnormal findings: Secondary | ICD-10-CM | POA: Insufficient documentation

## 2014-12-11 DIAGNOSIS — Z1151 Encounter for screening for human papillomavirus (HPV): Secondary | ICD-10-CM | POA: Diagnosis present

## 2014-12-11 DIAGNOSIS — T148 Other injury of unspecified body region: Secondary | ICD-10-CM | POA: Diagnosis not present

## 2014-12-11 DIAGNOSIS — Z124 Encounter for screening for malignant neoplasm of cervix: Secondary | ICD-10-CM | POA: Diagnosis not present

## 2014-12-11 DIAGNOSIS — W57XXXA Bitten or stung by nonvenomous insect and other nonvenomous arthropods, initial encounter: Secondary | ICD-10-CM | POA: Diagnosis not present

## 2014-12-11 MED ORDER — VARENICLINE TARTRATE 0.5 MG X 11 & 1 MG X 42 PO MISC: ORAL | Status: DC

## 2014-12-11 MED ORDER — TRIAMCINOLONE ACETONIDE 0.5 % EX OINT: 1.0000 "application " | TOPICAL_OINTMENT | Freq: Two times a day (BID) | CUTANEOUS | Status: DC

## 2014-12-11 NOTE — Patient Instructions (Addendum)
Try the kenalog ointment on your tick bite and your shoulder. Thin amount.   See you in 6 months for follow up.

## 2014-12-11 NOTE — Progress Notes (Signed)
Pre visit review using our clinic review tool, if applicable. No additional management support is needed unless otherwise documented below in the visit note. 

## 2014-12-11 NOTE — Progress Notes (Signed)
   Subjective:    Patient ID: Brooke Rush, female    DOB: 15-Aug-1975, 39 y.o.   MRN: [id number]  HPI  Brooke Rush is a 39 yo female here to get an UTD PAP and CC of lipoma on shoulder (left).   1) 1.5 mm place on shoulder keeps coming back had removed twice by Dermatology  2) Needs updated PAP and clinical breast exam.    Review of Systems  Constitutional: Negative for fever, chills, diaphoresis and fatigue.  Genitourinary: Negative for vaginal bleeding, vaginal discharge, difficulty urinating, vaginal pain, menstrual problem and pelvic pain.  Skin: Negative for color change, pallor, rash and wound.      Objective:   Physical Exam  Constitutional: She is oriented to person, place, and time. She appears well-developed and well-nourished. No distress.  BP 98/70 mmHg  Pulse 109  Temp(Src) 98.2 F (36.8 C)  Resp 16  Ht 5\' 4"  (1.626 m)  Wt 187 lb 6.4 oz (85.004 kg)  BMI 32.15 kg/m2  SpO2 98%  LMP 10/26/2014   HENT:  Head: Normocephalic and atraumatic.  Right Ear: External ear normal.  Left Ear: External ear normal.  Cardiovascular: Normal rate, regular rhythm and normal heart sounds.   Pulmonary/Chest: Effort normal and breath sounds normal. No respiratory distress. She has no wheezes. She has no rales. She exhibits no tenderness.  Clinical breast exam revealed dense breast tissue, no other significant findings.   Genitourinary: Guaiac negative stool. Vaginal discharge found.  White/clear copious discharge, Cervix difficult to find- retroverted uterus  Neurological: She is alert and oriented to person, place, and time. No cranial nerve deficit. She exhibits normal muscle tone. Coordination normal.  Skin: Skin is warm and dry. No rash noted. She is not diaphoretic. There is erythema.  Tick bite on posterior right thigh. Erythema surrounding are of bite less than 1 cm in diameter  Psychiatric: She has a normal mood and affect. Her behavior is normal. Judgment and thought  content normal.      Assessment & Plan:

## 2014-12-12 LAB — CYTOLOGY - PAP

## 2014-12-24 ENCOUNTER — Encounter: Payer: Self-pay | Admitting: Nurse Practitioner

## 2014-12-24 DIAGNOSIS — W57XXXA Bitten or stung by nonvenomous insect and other nonvenomous arthropods, initial encounter: Secondary | ICD-10-CM | POA: Insufficient documentation

## 2014-12-24 DIAGNOSIS — D179 Benign lipomatous neoplasm, unspecified: Secondary | ICD-10-CM | POA: Insufficient documentation

## 2014-12-24 DIAGNOSIS — Z124 Encounter for screening for malignant neoplasm of cervix: Secondary | ICD-10-CM | POA: Insufficient documentation

## 2014-12-24 NOTE — Assessment & Plan Note (Signed)
Normal pelvic exam and had pt place pillow under buttock to visualize cervix.

## 2014-12-24 NOTE — Assessment & Plan Note (Signed)
Left shoulder small 1.5 mm place that has been removed twice by dermatology. Pt is going back to dermatologist, but she reports it bothers her aesthetically.

## 2014-12-24 NOTE — Assessment & Plan Note (Addendum)
Tick bite shows some erythema. Sent in kenalog to try for itching.

## 2015-01-27 ENCOUNTER — Other Ambulatory Visit: Payer: Self-pay | Admitting: Orthopedic Surgery

## 2015-01-27 DIAGNOSIS — M5417 Radiculopathy, lumbosacral region: Secondary | ICD-10-CM

## 2015-01-27 DIAGNOSIS — M5442 Lumbago with sciatica, left side: Secondary | ICD-10-CM

## 2015-01-29 ENCOUNTER — Ambulatory Visit: Payer: BLUE CROSS/BLUE SHIELD

## 2015-02-02 ENCOUNTER — Ambulatory Visit
Admission: RE | Admit: 2015-02-02 | Discharge: 2015-02-02 | Disposition: A | Discharge status: Home / Self Care | Payer: BLUE CROSS/BLUE SHIELD | Source: Ambulatory Visit | Attending: Orthopedic Surgery | Admitting: Orthopedic Surgery

## 2015-02-02 DIAGNOSIS — M5442 Lumbago with sciatica, left side: Secondary | ICD-10-CM | POA: Diagnosis not present

## 2015-02-02 DIAGNOSIS — M5417 Radiculopathy, lumbosacral region: Secondary | ICD-10-CM | POA: Insufficient documentation

## 2015-02-03 ENCOUNTER — Ambulatory Visit (INDEPENDENT_AMBULATORY_CARE_PROVIDER_SITE_OTHER): Payer: BLUE CROSS/BLUE SHIELD | Admitting: Internal Medicine

## 2015-02-03 ENCOUNTER — Encounter: Payer: Self-pay | Admitting: Internal Medicine

## 2015-02-03 VITALS — BP 110/64 | HR 100 | Temp 98.3°F | Resp 12 | Ht 64.0 in | Wt 187.2 lb

## 2015-02-03 DIAGNOSIS — R7989 Other specified abnormal findings of blood chemistry: Secondary | ICD-10-CM

## 2015-02-03 DIAGNOSIS — E2740 Unspecified adrenocortical insufficiency: Secondary | ICD-10-CM | POA: Diagnosis not present

## 2015-02-03 NOTE — Patient Instructions (Signed)
Please return in 3 months (at 8 am) for a cosyntropin stimulation test.   We will schedule another appt if the labs are abnormal.  Try to limit opioids and steroids.

## 2015-02-03 NOTE — Progress Notes (Signed)
Patient ID: Brooke Rush, female   DOB: 29-Oct-1975, 39 y.o.   MRN: [id number]   HPI  Brooke Rush is a 39 y.o.-year-old female, referred by her PCP, Dr Flonnie Overman, for evaluation for low cortisol level. Patient has seen Dr. Howell Rucks in the past, however, she since left clinic.  She started to have fatigue ~07/2014, had sensation of passing out (BP 75/50) >> started to drink more water and wear ted hoses, eating more salt.   Pt. has been investigated for fatigue this summer and she was found to have a cortisol level of:  08/25/2014: 5.7 Component     Latest Ref Rng 11/13/2014  Cortisol, Plasma      7.2   She saw Dr. Howell Rucks, who checked a cosyntropin stimulation test, that returned normal, except for a slightly low cortisol level at time 0: Component     Latest Ref Rng 11/20/2014 11/20/2014 11/20/2014         9:59 AM 10:29 AM 10:59 AM  Cortisol, Plasma      3.7 21.1 25.2  C206 ACTH     6 - 50 pg/mL 9     She comes to see me to discuss the above results and see what else needs to be done for her low a.m. cortisol level  Review Dr. 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 Prior to this she visited the [place] in clinic through [facility]- was given Toradol injection 07/27/14. Steroid taper given to patient, but never filled at that time. This pain continued and was worse and different than her usual amount of pain from Fibro, hence she was evaluated by her orthopedic physician in March at [facility], arthritis panel was normal, but cortisol was low at 5.7 on 08/25/14 taken non fasting after 9am. Patient had worked the night shift the night prior, eaten her meal at end of shift and gone for an appointment that day. She usually works 4 days weekly from Thursday through Sunday ( 7pm to 7am). The back pain continues and she was given a 5 day medrol dose pack by her PCP; she took this in April 2016.   C/o Fatigue  since last 4 years along with muscle aches and point tenderness - fibromyalgia dx'ed 2006 Denies low BP and fainting , but recalls one episode of near syncope in the past  Tends to get nausea with migraines, extreme pain or constipation Mild salt craving that has been noticed by her in retrospect No skin hyperpigmentation Hx GDM 2012 was on metformin at that time, baby later passed away, had lost weight during grieving. FH DM in mom, MGF Now on 2000 units OTC Vitamin D daily + liquid magnesium - feeling much better on these Is also on Norco/Vicodin chronic Does report some easy bruising.  Back pain - still an issue >> takes opioids during the weekends when she works. She also sees a Restaurant manager, fast food. 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  Pt mentions: - + weight loss - intentional, through exercise, 5 pounds in last 2 months - + fatigue - + hot flushes - + nausea, + heartburn - no vomiting - no abdominal pain - + mm aches - [employer], yoga, riding bike twice a week -trying to lose weight   - no dizziness - no syncopal episodes - no dark skin discoloration  No h/o hyponatremia or hyperkalemia.   Chemistry      Component Value Date/Time   NA 138 11/20/2014 0959   K 4.6 11/20/2014 0959   CL 107 11/20/2014 0959   CO2 26 11/20/2014 0959   BUN 6 11/20/2014 0959   CREATININE 0.64 11/20/2014 0959      Component Value Date/Time   CALCIUM 9.3 11/20/2014 0959   ALKPHOS 69 07/18/2014 1055   AST 20 07/18/2014 1055   ALT 23 07/18/2014 1055   BILITOT 0.5 07/18/2014 1055     Her hemoglobin A1c was normal: Lab Results  Component Value Date   HGBA1C 5.6 09/18/2014   She had a low vitamin D, which improved at last check in 11/2014: Component     Latest Ref Rng 09/18/2014 11/13/2014  VITD     30.00 - 100.00 ng/mL 21.41 (L) 35.64   She was not anemic at last CBC check in 07/2014: Lab Results  Component Value Date   WBC 8.3  07/18/2014   HGB 15.2* 07/18/2014   HCT 45.5 07/18/2014   MCV 94.7 07/18/2014   PLT 331.0 07/18/2014   Last TSH: Lab Results  Component Value Date   TSH 1.30 07/18/2014   ROS: Constitutional: see HPI Eyes: + blurry vision, no xerophthalmia ENT: + sore throat, no nodules palpated in throat, no dysphagia/odynophagia, no hoarseness, + decreased hearing Cardiovascular: no CP/+ SOB/+ palpitations/+ hand swelling Respiratory: no cough/+ SOB Gastrointestinal: + all: Heartburn, nausea, constipation Musculoskeletal: + both: muscle/joint aches Skin: + all: Rash, easy bruising, itching Neurological: no tremors/numbness/tingling/dizziness, + HA (migraines) Psychiatric: no depression/+ anxiety + Low libido  Past Medical History  Diagnosis Date  . Migraines   . GERD (gastroesophageal reflux disease)   . Kidney stones   . Fibromyalgia     Diagnosed by Dr. Derrel Nip  . Plantar fasciitis     bilateral  . Constipation     2/2 pain medication  h/o kidney stones - 2009  Past Surgical History  Procedure Laterality Date  . Cholecystectomy  2011  . Tonsillectomy and adenoidectomy  2013  . Carpal tunnel release Bilateral 1998  . Nasal sinus surgery  2011    Dr. Pryor Ochoa  . Nasal septum surgery  2011  . Hemorroidectomy      Dr. Tamala Julian   Social History   Social History  . Marital Status: Single, getting married soon    Spouse Name: N/A  . Number of Children: 2  . Years of Education: 14   Occupational History  . CNA for [employer]   Social History Main Topics  . Smoking status: Current Every Day Smoker -- 1.00 packs/day for 21 years  . Smokeless tobacco: Not on file  . Alcohol Use: 0.0 oz/week    0 Standard drinks or equivalent per week     Comment: 1-2 drinks twice a week  . Drug Use: No  . Sexual Activity:    Partners: Male     Comment: Boyfriend   Social History Narrative   Brooke Rush grew up in [place], [place]. She is living at home with her  boyfriend and one of her children. She also has a 37 y/o daughter who lives with her aunt. Brooke Rush also had a baby girl that passed away in [id number] (16 days old). 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 Marialuisa enjoys  crafting on her spare time.   Current Outpatient Prescriptions on File Prior to Visit  Medication Sig Dispense Refill  . b complex vitamins tablet Take 1 tablet by mouth daily.    . celecoxib (CELEBREX) 200 MG capsule Take 200 mg by mouth daily as needed.    . cetirizine (ZYRTEC) 10 MG tablet Take 10 mg by mouth daily.    . cholecalciferol (VITAMIN D) 1000 UNITS tablet Take 2,000 Units by mouth daily.    . cyclobenzaprine (FLEXERIL) 5 MG tablet Take 1 tablet (5 mg total) by mouth 3 (three) times daily as needed for muscle spasms. 90 tablet 1  . HYDROcodone-acetaminophen (NORCO/VICODIN) 5-325 MG per tablet Take 1 tablet once a day as needed for pain 30 tablet 0  . LORazepam (ATIVAN) 1 MG tablet Take 1 tablet (1 mg total) by mouth at bedtime as needed for anxiety. 90 tablet 1  . Omega-3 Fatty Acids (FISH OIL) 1000 MG CAPS Take 1,000 mg by mouth 1 day or 1 dose.    . ondansetron (ZOFRAN ODT) 4 MG disintegrating tablet Take 1 tablet (4 mg total) by mouth every 8 (eight) hours as needed for nausea or vomiting. 20 tablet 0  . valACYclovir (VALTREX) 1000 MG tablet Take 1 tablet (1,000 mg total) by mouth 2 (two) times daily. (Patient taking differently: Take 1,000 mg by mouth as needed. ) 90 tablet 0  . esomeprazole (NEXIUM) 40 MG capsule Take 1 capsule (40 mg total) by mouth 2 (two) times daily. (Patient not taking: Reported on 02/03/2015) 180 capsule 1  . triamcinolone ointment (KENALOG) 0.5 % Apply 1 application topically 2 (two) times daily. (Patient not taking: Reported on 02/03/2015) 30 g 0  . varenicline (CHANTIX STARTING MONTH PAK) 0.5 MG X 11 & 1 MG X 42 tablet Take one 0.5 mg tablet by mouth once daily for 3  days, then increase to one 0.5 mg tablet twice daily for 4 days, then increase to one 1 mg tablet twice daily. (Patient not taking: Reported on 02/03/2015) 53 tablet 0  . vitamin E 1000 UNIT capsule Take 1,000 Units by mouth daily.    . [DISCONTINUED] topiramate (TOPAMAX) 25 MG tablet Take 1 tablet (25 mg total) by mouth 2 (two) times daily. 21 tablet 0   No current facility-administered medications on file prior to visit.   No Known Allergies Family History  Problem Relation Age of Onset  . Arthritis Mother     Degenerative Disc Dz  . Obesity Mother   . Alcohol abuse Father   . Hyperlipidemia Father   . Heart disease Father     CAD - CABG age 47  . Kidney disease Maternal Grandmother   . Heart disease Maternal Grandmother   . Heart disease Maternal Grandfather   . Hyperlipidemia Maternal Grandfather   . Hypertension Maternal Grandfather    PE: BP 110/64 mmHg  Pulse 100  Temp(Src) 98.3 F (36.8 C) (Oral)  Resp 12  Ht 5\' 4"  (1.626 m)  Wt 187 lb 3.2 oz (84.913 kg)  BMI 32.12 kg/m2  SpO2 97%  LMP 01/25/2015 Wt Readings from Last 3 Encounters:  02/03/15 187 lb 3.2 oz (84.913 kg)  12/11/14 187 lb 6.4 oz (85.004 kg)  11/20/14 192 lb 8 oz (87.317 kg)   Constitutional: overweight - central disposition of fat, also buffalo hump, but no full supraclavicular fat pads; in NAD Eyes: PERRLA, EOMI, no exophthalmos ENT: moist mucous membranes, no thyromegaly, no cervical lymphadenopathy Cardiovascular: tachycardia,RR, No MRG Respiratory: CTA  B Gastrointestinal: abdomen soft, NT, ND, BS+ Musculoskeletal: no deformities, strength intact in all 4 Skin: moist, warm, no rashes; no dark discoloration of skin Neurological: no tremor with outstretched hands, DTR normal in all 4  ASSESSMENT: 1. Low cortisol level  PLAN:  1. Low cortisol level  - I reviewed patient's cortisol test results along with the patient. I explained that the a.m. cortisol levels have been normal, but on the low  side, except the one performed at that time of her cosyntropin stimulation test, which was slightly lower than the lower limit of normal. The stimulation test, however, was normal, with a maximum cortisol obtained an hour after synthetic ACTH, being at goal, at 25. This is very reassuring, as a normal stim test trumps a low a.m. Cortisol. - We discussed about possible reasons for her low a.m. cortisol: - opiate treatment can suppress her hypothalamic-pituitary-adrenal axis with subsequent slightly low a.m. cortisol. She now only needs to take her narcotics during the weekend, when she works, as she has a highly physical work and she has back pain. 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  - Also, a more physiologic circadian rhythm will help normalize the a.m. cortisol level >> her new job is a day job only, prev. Working nights, also. - We also discussed that another possible culprit for the low cortisol level could have been her previous steroid taper. I advised her to avoid steroid in the future, as much as possible.   - We discussed about repeating the cosyntropin stimulation test in 2-3 months (after she starts her new job) to see if it remains normal.   If it is abnormal, she may have a diagnosis of secondary adrenal insufficiency which will need to be investigated further.   If it is normal, no further investigation is necessary. - I ordered the following tests, for which she will return in 2-3 months: Orders Placed This Encounter  Procedures  . ACTH  . Cortisol  . Cortisol  . Cortisol  - I will see the patient back if the above tests are abnormal.  - time spent with the patient: 40 min, of which >50% was spent in obtaining information about her symptoms, reviewing her previous labs, evaluations, and treatments, counseling her about her condition (please see the discussed topics above), and developing a plan to  further investigate it; she had a number of questions which I addressed.

## 2015-02-15 ENCOUNTER — Other Ambulatory Visit: Payer: Self-pay | Admitting: Nurse Practitioner

## 2015-04-01 ENCOUNTER — Ambulatory Visit: Admission: RE | Admit: 2015-04-01 | Payer: BLUE CROSS/BLUE SHIELD | Source: Ambulatory Visit | Admitting: Otolaryngology

## 2015-04-01 ENCOUNTER — Encounter: Admission: RE | Payer: Self-pay | Source: Ambulatory Visit

## 2015-04-01 SURGERY — EXAM UNDER ANESTHESIA
Anesthesia: General

## 2015-05-13 ENCOUNTER — Telehealth: Payer: Self-pay | Admitting: Nurse Practitioner

## 2015-05-13 ENCOUNTER — Ambulatory Visit (INDEPENDENT_AMBULATORY_CARE_PROVIDER_SITE_OTHER): Payer: 59 | Admitting: Nurse Practitioner

## 2015-05-13 VITALS — BP 102/84 | HR 98 | Temp 98.2°F | Resp 14 | Ht 64.0 in | Wt 197.2 lb

## 2015-05-13 DIAGNOSIS — R0789 Other chest pain: Secondary | ICD-10-CM

## 2015-05-13 LAB — D-DIMER, QUANTITATIVE (NOT AT ARMC): D DIMER QUANT: 0.27 ug{FEU}/mL (ref 0.00–0.48)

## 2015-05-13 LAB — COMPREHENSIVE METABOLIC PANEL
ALT: 17 U/L (ref 0–35)
AST: 15 U/L (ref 0–37)
Albumin: 4.2 g/dL (ref 3.5–5.2)
Alkaline Phosphatase: 54 U/L (ref 39–117)
BUN: 10 mg/dL (ref 6–23)
CALCIUM: 9.3 mg/dL (ref 8.4–10.5)
CHLORIDE: 106 meq/L (ref 96–112)
CO2: 27 mEq/L (ref 19–32)
CREATININE: 0.7 mg/dL (ref 0.40–1.20)
GFR: 99.57 mL/min (ref >60.00)
Glucose, Bld: 108 mg/dL — ABNORMAL HIGH (ref 70–99)
POTASSIUM: 4.5 meq/L (ref 3.5–5.1)
SODIUM: 139 meq/L (ref 135–145)
Total Bilirubin: 0.3 mg/dL (ref 0.2–1.2)
Total Protein: 7.1 g/dL (ref 6.0–8.3)

## 2015-05-13 LAB — CBC WITH DIFFERENTIAL/PLATELET
BASOS ABS: 0 10*3/uL (ref 0.0–0.1)
Basophils Relative: 0.6 % (ref 0.0–3.0)
Eosinophils Absolute: 0.3 10*3/uL (ref 0.0–0.7)
Eosinophils Relative: 3.4 % (ref 0.0–5.0)
HCT: 46.2 % — ABNORMAL HIGH (ref 36.0–46.0)
Hemoglobin: 15.1 g/dL — ABNORMAL HIGH (ref 12.0–15.0)
LYMPHS ABS: 2.5 10*3/uL (ref 0.7–4.0)
Lymphocytes Relative: 28.2 % (ref 12.0–46.0)
MCHC: 32.7 g/dL (ref 30.0–36.0)
MCV: 98.1 fl (ref 78.0–100.0)
MONOS PCT: 5.9 % (ref 3.0–12.0)
Monocytes Absolute: 0.5 10*3/uL (ref 0.1–1.0)
NEUTROS PCT: 61.9 % (ref 43.0–77.0)
Neutro Abs: 5.5 10*3/uL (ref 1.4–7.7)
Platelets: 292 10*3/uL (ref 150.0–400.0)
RBC: 4.71 Mil/uL (ref 3.87–5.11)
RDW: 13.5 % (ref 11.5–15.5)
WBC: 8.8 10*3/uL (ref 4.0–10.5)

## 2015-05-13 LAB — TROPONIN I: TNIDX: 0 ug/L (ref 0.00–0.06)

## 2015-05-13 NOTE — Telephone Encounter (Signed)
Pt refused to go to ED. Appt scheduled with [name] at 2:00

## 2015-05-13 NOTE — Telephone Encounter (Signed)
[facility] Patient Name: Brooke Rush Gender: Female DOB: Sep 08, 1975 Age: 39 Y 16 M 3 D Return Phone Number: [phone] (Primary) Address: City/State/Zip: [place] Client [facility] [facility] [place] Client Site [place] [facility] Physician [place], [place] Type Call Call Type Triage / Clinical Relationship To Patient Self Appointment Disposition EMR Appointment Not Necessary Info pasted into Epic Yes Return Phone Number [phone] (Primary) Chief Complaint CHEST PAIN (>=21 years) - pain, pressure, heaviness or tightness Initial Comment Caller states been having chest pain PreDisposition Call Doctor Nurse Assessment Nurse: Mechele Dawley, RN, Amy Date/Time Eilene Ghazi Time): 05/13/2015 9:10:09 AM Confirm and document reason for call. If symptomatic, describe symptoms. ---CALLER STATES THAT SHE IS HAVING SOME CHEST PAIN. STATES IT STARTED A WEEK AGO. INTERMITTENT PAINS, SHARP. DIFFICULTY BREATHING AT TIMES. SHE WOULD HAVE SOME FLUTTERS AT TIMES. RIGHT SIDE AND CENTER PART OF THE CHEST. DULL ACHE AND AT TIMES SHE WILL GET A SHARP PAIN WITH MOVEMENT. DEEP BREATH MAKES THE PAIN WORSE. NO COUGH RECENTLY. SHE IS A SMOKER AND SHE HAS THE SMOKERS COUGH. SHE DENIES NOTICING ANYTHING IN PARTICULAR WHEN THE PAIN STARTED. SOB AT TIMES. Has the patient traveled out of the country within the last 30 days? ---Not Applicable Does the patient have any new or worsening symptoms? ---Yes Will a triage be completed? ---Yes Related visit to physician within the last 2 weeks? ---No Does the PT have any chronic conditions? (i.e. diabetes, asthma, etc.) ---Yes List chronic conditions. ---LOW BLOOD PRESSURE, ANXIETY ISSUES, CAROTID ARTERY PROBLEM DID NOT FOLLOW THROUGH WITH Did the patient indicate they were pregnant? ---No Is this a behavioral health or  substance abuse call? ---No PLEASE NOTE: All timestamps contained within this report are represented as [place] Standard Time. CONFIDENTIALTY NOTICE: This fax transmission is intended only for the addressee. It contains information that is legally privileged, confidential or otherwise protected from use or disclosure. If you are not the intended recipient, you are strictly prohibited from reviewing, disclosing, copying using or disseminating any of this information or taking any action in reliance on or regarding this information. If you have received this fax in error, please notify us immediately by telephone so that we can arrange for its return to [place]. Phone: [phone], Toll-Free: [phone], Fax: [phone] Page: 2 of 2 Call Id: [id number] Guidelines Guideline Title Affirmed Question Affirmed Notes Nurse Date/Time Eilene Ghazi Time) Chest Pain [1] Intermittent chest pain or "angina" AND [2] increasing in severity or frequency (Exception: pains lasting a few seconds) Mechele Dawley, RN, Amy 05/13/2015 9:12:56 AM Disp. Time Eilene Ghazi Time) Disposition Final User 05/13/2015 9:05:31 AM Send to Urgent Queue Fransico Michael 05/13/2015 9:15:35 AM Go to ED Now Yes Mechele Dawley, RN, Amy Caller Understands: Yes Disagree/Comply: Comply Care Advice Given Per Guideline GO TO ED NOW: You need to be seen in the Emergency Department. Go to the ER at ___________ [place] now. Drive carefully. CARE ADVICE given per Chest Pain (Adult) guideline. After Care Instructions Given Call Event Type User Date / Time Description Referrals [facility] - ED

## 2015-05-13 NOTE — Progress Notes (Signed)
Pre visit review using our clinic review tool, if applicable. No additional management support is needed unless otherwise documented below in the visit note. 

## 2015-05-13 NOTE — Patient Instructions (Signed)
Nexium 30 minutes before your biggest meal.   Once we get your results back we will come up with the next course of action.

## 2015-05-13 NOTE — Progress Notes (Signed)
Patient ID: Brooke Rush, female    DOB: 01-Nov-1975  Age: 39 y.o. MRN: [id number]  CC: Chest Pain   HPI Brooke Rush presents for CC of intermittent chest pains.   1) Sharp pains between breasts, took Gaviscon and ativan- worked at first   Not taking anything for heart burn regularly  Lays down or breaths it moves down/ "feels like it flips"  Onset- 1 week ago  Location- middle of chest Duration- Lasts for minutes to an hour Characteristics- sharp or dull, heavy  Aggravating factors- stress Relieving factors- Relaxing  Severity- severe at moments then down mild-moderate  2) Smoking-   Haven't started Chantix yet   Vaping since Oct.   Same caffeine intake as usually  Stress- same as usual    History Brooke Rush has a past medical history of Migraines; GERD (gastroesophageal reflux disease); Kidney stones; Fibromyalgia; Plantar fasciitis; Constipation; ETD (eustachian tube dysfunction); Otorrhea; and Kidney stones.   She has past surgical history that includes Cholecystectomy (2011); Tonsillectomy and adenoidectomy (2013); Carpal tunnel release (Bilateral, 1998); Nasal sinus surgery (2011); Nasal septum surgery (2011); and Hemorroidectomy.   Her family history includes Alcohol abuse in her father; Arthritis in her mother; Heart disease in her father, maternal grandfather, and maternal grandmother; Hyperlipidemia in her father and maternal grandfather; Hypertension in her maternal grandfather; Kidney disease in her maternal grandmother; Obesity in her mother.She reports that she has been smoking.  She does not have any smokeless tobacco history on file. She reports that she drinks alcohol. She reports that she does not use illicit drugs.  Outpatient Prescriptions Prior to Visit  Medication Sig Dispense Refill  . b complex vitamins tablet Take 1 tablet by mouth daily.    . celecoxib (CELEBREX) 200 MG capsule Take 200 mg by mouth daily as needed.    . cetirizine (ZYRTEC) 10  MG tablet Take 10 mg by mouth daily.    . cholecalciferol (VITAMIN D) 1000 UNITS tablet Take 2,000 Units by mouth daily.    Marland Kitchen CIPRODEX otic suspension INSTILL 4 DROPS INTO BOTH EARS TWICE DAILY AS DIRECTED FOR 10 DAYS  6  . cyclobenzaprine (FLEXERIL) 5 MG tablet Take 1 tablet (5 mg total) by mouth 3 (three) times daily as needed for muscle spasms. 90 tablet 1  . esomeprazole (NEXIUM) 40 MG capsule Take 1 capsule (40 mg total) by mouth 2 (two) times daily. 180 capsule 1  . fluconazole (DIFLUCAN) 100 MG tablet TAKE ONE TABLET BY MOUTH DAILY AS NEEDED FOR YEAST INFECTION.  1  . HYDROcodone-acetaminophen (NORCO/VICODIN) 5-325 MG per tablet Take 1 tablet once a day as needed for pain 30 tablet 0  . LORazepam (ATIVAN) 1 MG tablet Take 1 tablet (1 mg total) by mouth at bedtime as needed for anxiety. 90 tablet 1  . ofloxacin (OCUFLOX) 0.3 % ophthalmic solution PLACE 4 TO 5 DROPS INTO AFFECTED EAR TWICE A DAY FOR 21 DAYS  3  . Omega-3 Fatty Acids (FISH OIL) 1000 MG CAPS Take 1,000 mg by mouth 1 day or 1 dose.    . ondansetron (ZOFRAN-ODT) 4 MG disintegrating tablet PLACE 1 TABLET ON TONGUE EVERY 8 HOURS AS NEEDED FOR NAUSEA OR VOMITING 20 tablet 1  . triamcinolone ointment (KENALOG) 0.5 % Apply 1 application topically 2 (two) times daily. 30 g 0  . valACYclovir (VALTREX) 1000 MG tablet TAKE 1 TABLET TWICE A DAY 90 tablet 4  . varenicline (CHANTIX STARTING MONTH PAK) 0.5 MG X 11 & 1 MG  X 42 tablet Take one 0.5 mg tablet by mouth once daily for 3 days, then increase to one 0.5 mg tablet twice daily for 4 days, then increase to one 1 mg tablet twice daily. 53 tablet 0  . vitamin E 1000 UNIT capsule Take 1,000 Units by mouth daily.    Marland Kitchen gabapentin (NEURONTIN) 300 MG capsule Take by mouth.     No facility-administered medications prior to visit.    ROS Review of Systems  Constitutional: Negative for fever, chills, diaphoresis and fatigue.  HENT: Negative for trouble swallowing.   Respiratory: Positive for  chest tightness and shortness of breath. Negative for wheezing.   Cardiovascular: Positive for chest pain. Negative for palpitations and leg swelling.  Gastrointestinal: Negative for nausea, vomiting and diarrhea.  Skin: Negative for rash.  Neurological: Negative for dizziness, weakness, numbness and headaches.  Psychiatric/Behavioral: Negative for suicidal ideas and sleep disturbance. The patient is not nervous/anxious.     Objective:  BP 102/84 mmHg  Pulse 98  Temp(Src) 98.2 F (36.8 C)  Resp 14  Ht $R'5\' 4"'pp$  (1.626 m)  Wt 197 lb 3.2 oz (89.449 kg)  BMI 33.83 kg/m2  SpO2 98%  Physical Exam  Constitutional: She is oriented to person, place, and time. She appears well-developed and well-nourished. No distress.  HENT:  Head: Normocephalic and atraumatic.  Right Ear: External ear normal.  Left Ear: External ear normal.  Eyes: EOM are normal. Pupils are equal, round, and reactive to light. Right eye exhibits no discharge. Left eye exhibits no discharge. No scleral icterus.  Glasses  Neck: Normal range of motion. Neck supple.  Cardiovascular: Normal rate, regular rhythm and normal heart sounds.  Exam reveals no gallop and no friction rub.   No murmur heard. Pulmonary/Chest: Effort normal and breath sounds normal. No respiratory distress. She has no wheezes. She has no rales. She exhibits no tenderness.  Abdominal: Soft. Bowel sounds are normal. She exhibits no distension and no mass. There is no tenderness. There is no rebound and no guarding.  Musculoskeletal: Normal range of motion. She exhibits no edema or tenderness.  Tested ROM of UE passive and then against resistance to reproduce symptoms- could not  Lymphadenopathy:    She has no cervical adenopathy.  Neurological: She is alert and oriented to person, place, and time. No cranial nerve deficit. She exhibits normal muscle tone. Coordination normal.  Skin: Skin is warm and dry. No rash noted. She is not diaphoretic.  Psychiatric:  Her behavior is normal. Judgment and thought content normal.  Flat affect today   Assessment & Plan:   Brooke Rush was seen today for chest pain.  Diagnoses and all orders for this visit:  Other chest pain -     EKG 12-Lead -     Comp Met (CMET) -     Troponin I -     D-Dimer, Quantitative -     CBC w/Diff   I am having Ms. Elray Buba maintain her Fish Oil, vitamin E, b complex vitamins, cyclobenzaprine, esomeprazole, LORazepam, HYDROcodone-acetaminophen, cholecalciferol, cetirizine, celecoxib, varenicline, triamcinolone ointment, gabapentin, CIPRODEX, ofloxacin, fluconazole, ondansetron, and valACYclovir.  No orders of the defined types were placed in this encounter.     Follow-up: Return if symptoms worsen or fail to improve.

## 2015-05-13 NOTE — Telephone Encounter (Signed)
Please call.

## 2015-05-14 ENCOUNTER — Telehealth: Payer: Self-pay | Admitting: Nurse Practitioner

## 2015-05-14 NOTE — Telephone Encounter (Signed)
Pt called to get test results from getting labs done on yesterday. Thank you!

## 2015-05-14 NOTE — Telephone Encounter (Signed)
Patient called to get her test results given to her .

## 2015-05-15 NOTE — Telephone Encounter (Signed)
Please advise, Labs are in the Chart for review. I didn't see that the patient has been contacted yet.

## 2015-05-15 NOTE — Telephone Encounter (Signed)
Attempted to call patient and left a message for her to return my call.

## 2015-05-15 NOTE — Telephone Encounter (Signed)
Normal results- I want to see how she is feeling currently. I sent a MyChart- but she may not read it

## 2015-05-21 ENCOUNTER — Other Ambulatory Visit: Payer: Self-pay

## 2015-05-21 ENCOUNTER — Emergency Department: Payer: Managed Care, Other (non HMO)

## 2015-05-21 ENCOUNTER — Emergency Department
Admission: EM | Admit: 2015-05-21 | Discharge: 2015-05-21 | Disposition: A | Discharge status: Home / Self Care | Payer: Managed Care, Other (non HMO) | Attending: Emergency Medicine | Admitting: Emergency Medicine

## 2015-05-21 ENCOUNTER — Encounter: Payer: Self-pay | Admitting: *Deleted

## 2015-05-21 DIAGNOSIS — F419 Anxiety disorder, unspecified: Secondary | ICD-10-CM | POA: Diagnosis not present

## 2015-05-21 DIAGNOSIS — F17210 Nicotine dependence, cigarettes, uncomplicated: Secondary | ICD-10-CM | POA: Diagnosis not present

## 2015-05-21 DIAGNOSIS — J9801 Acute bronchospasm: Secondary | ICD-10-CM | POA: Diagnosis not present

## 2015-05-21 DIAGNOSIS — Z792 Long term (current) use of antibiotics: Secondary | ICD-10-CM | POA: Diagnosis not present

## 2015-05-21 DIAGNOSIS — Z7952 Long term (current) use of systemic steroids: Secondary | ICD-10-CM | POA: Diagnosis not present

## 2015-05-21 DIAGNOSIS — R079 Chest pain, unspecified: Secondary | ICD-10-CM | POA: Diagnosis present

## 2015-05-21 DIAGNOSIS — Z79899 Other long term (current) drug therapy: Secondary | ICD-10-CM | POA: Diagnosis not present

## 2015-05-21 LAB — BASIC METABOLIC PANEL
ANION GAP: 7 (ref 5–15)
BUN: 9 mg/dL (ref 6–20)
CHLORIDE: 107 mmol/L (ref 101–111)
CO2: 26 mmol/L (ref 22–32)
Calcium: 9.4 mg/dL (ref 8.9–10.3)
Creatinine, Ser: 0.59 mg/dL (ref 0.44–1.00)
GFR calc non Af Amer: >60 mL/min (ref >60)
GLUCOSE: 107 mg/dL — AB (ref 65–99)
Potassium: 3.9 mmol/L (ref 3.5–5.1)
Sodium: 140 mmol/L (ref 135–145)

## 2015-05-21 LAB — FIBRIN DERIVATIVES D-DIMER (ARMC ONLY): Fibrin derivatives D-dimer (ARMC): 192 (ref 0–499)

## 2015-05-21 LAB — CBC
HEMATOCRIT: 43.5 % (ref 35.0–47.0)
HEMOGLOBIN: 14.2 g/dL (ref 12.0–16.0)
MCH: 31.8 pg (ref 26.0–34.0)
MCHC: 32.7 g/dL (ref 32.0–36.0)
MCV: 97.1 fL (ref 80.0–100.0)
Platelets: 268 10*3/uL (ref 150–440)
RBC: 4.48 MIL/uL (ref 3.80–5.20)
RDW: 13 % (ref 11.5–14.5)
WBC: 8.9 10*3/uL (ref 3.6–11.0)

## 2015-05-21 LAB — TROPONIN I: Troponin I: <0.03 ng/mL (ref <0.031)

## 2015-05-21 MED ORDER — PREDNISONE 50 MG PO TABS: 50.0000 mg | ORAL_TABLET | Freq: Every day | ORAL | Status: DC

## 2015-05-21 MED ORDER — IPRATROPIUM-ALBUTEROL 0.5-2.5 (3) MG/3ML IN SOLN
3.0000 mL | Freq: Once | RESPIRATORY_TRACT | Status: AC
  Administered 2015-05-21: 3 mL via RESPIRATORY_TRACT
  Filled 2015-05-21: qty 3

## 2015-05-21 NOTE — ED Notes (Signed)
Pt states chest pain and SOB for about 1 week, deneis any nausea or vomiting, states she feels like she cant get a good breathe, pt ambulatory awake and alert

## 2015-05-21 NOTE — ED Provider Notes (Addendum)
[facility] Emergency Department Provider Note  ____________________________________________  Time seen: On arrival  I have reviewed the triage vital signs and the nursing notes.   HISTORY  Chief Complaint Chest Pain and Shortness of Breath    HPI Brooke Rush is a 39 y.o. female who presents with complaints of a tightness in her chest for approximately one week. She reports it feels as if she can't take a very deep breath. No recent travel. No history of blood clots. No calf pain. No fevers chills or cough. No history of asthma. She does smoke cigarettes. No diaphoresis or radiation of pain.     Past Medical History  Diagnosis Date  . Migraines   . GERD (gastroesophageal reflux disease)   . Kidney stones   . Fibromyalgia     Diagnosed by Dr. Derrel Nip  . Plantar fasciitis     bilateral  . Constipation     2/2 pain medication  . ETD (eustachian tube dysfunction)   . Otorrhea   . Kidney stones     Hx of    Patient Active Problem List   Diagnosis Date Noted  . Pap smear for cervical cancer screening 12/24/2014  . Lipoma 12/24/2014  . Tick bite 12/24/2014  . Dysuria 07/12/2014  . Routine general medical examination at a health care facility 07/12/2014  . Fibromyalgia 08/05/2013  . Genital herpes 08/05/2013  . Hypertriglyceridemia 08/05/2013  . Medication management 08/05/2013  . Fatigue 08/05/2013  . Tobacco abuse counseling 08/05/2013  . Back pain 07/24/2013  . Constipation 07/24/2013    Past Surgical History  Procedure Laterality Date  . Cholecystectomy  2011  . Tonsillectomy and adenoidectomy  2013  . Carpal tunnel release Bilateral 1998  . Nasal sinus surgery  2011    Dr. Pryor Ochoa  . Nasal septum surgery  2011  . Hemorroidectomy      Dr. Tamala Julian    Current Outpatient Rx  Name  Route  Sig  Dispense  Refill  . b complex vitamins tablet   Oral   Take 1 tablet by mouth daily.         . celecoxib (CELEBREX) 200 MG  capsule   Oral   Take 200 mg by mouth daily as needed.         . cetirizine (ZYRTEC) 10 MG tablet   Oral   Take 10 mg by mouth daily.         . cholecalciferol (VITAMIN D) 1000 UNITS tablet   Oral   Take 2,000 Units by mouth daily.         Marland Kitchen CIPRODEX otic suspension      INSTILL 4 DROPS INTO BOTH EARS TWICE DAILY AS DIRECTED FOR 10 DAYS      6     Dispense as written.   . cyclobenzaprine (FLEXERIL) 5 MG tablet   Oral   Take 1 tablet (5 mg total) by mouth 3 (three) times daily as needed for muscle spasms.   90 tablet   1   . esomeprazole (NEXIUM) 40 MG capsule   Oral   Take 1 capsule (40 mg total) by mouth 2 (two) times daily.   180 capsule   1   . fluconazole (DIFLUCAN) 100 MG tablet      TAKE ONE TABLET BY MOUTH DAILY AS NEEDED FOR YEAST INFECTION.      1   . EXPIRED: gabapentin (NEURONTIN) 300 MG capsule   Oral   Take by mouth.         Marland Kitchen  HYDROcodone-acetaminophen (NORCO/VICODIN) 5-325 MG per tablet      Take 1 tablet once a day as needed for pain   30 tablet   0   . LORazepam (ATIVAN) 1 MG tablet   Oral   Take 1 tablet (1 mg total) by mouth at bedtime as needed for anxiety.   90 tablet   1   . ofloxacin (OCUFLOX) 0.3 % ophthalmic solution      PLACE 4 TO 5 DROPS INTO AFFECTED EAR TWICE A DAY FOR 21 DAYS      3   . Omega-3 Fatty Acids (FISH OIL) 1000 MG CAPS   Oral   Take 1,000 mg by mouth 1 day or 1 dose.         . ondansetron (ZOFRAN-ODT) 4 MG disintegrating tablet      PLACE 1 TABLET ON TONGUE EVERY 8 HOURS AS NEEDED FOR NAUSEA OR VOMITING   20 tablet   1   . predniSONE (DELTASONE) 50 MG tablet   Oral   Take 1 tablet (50 mg total) by mouth daily with breakfast.   5 tablet   0   . triamcinolone ointment (KENALOG) 0.5 %   Topical   Apply 1 application topically 2 (two) times daily.   30 g   0   . valACYclovir (VALTREX) 1000 MG tablet      TAKE 1 TABLET TWICE A DAY   90 tablet   4   . varenicline (CHANTIX STARTING  MONTH PAK) 0.5 MG X 11 & 1 MG X 42 tablet      Take one 0.5 mg tablet by mouth once daily for 3 days, then increase to one 0.5 mg tablet twice daily for 4 days, then increase to one 1 mg tablet twice daily.   53 tablet   0   . vitamin E 1000 UNIT capsule   Oral   Take 1,000 Units by mouth daily.           Allergies Review of patient's allergies indicates no known allergies.  Family History  Problem Relation Age of Onset  . Arthritis Mother     Degenerative Disc Dz  . Obesity Mother   . Alcohol abuse Father   . Hyperlipidemia Father   . Heart disease Father     CAD - CABG age 49  . Kidney disease Maternal Grandmother   . Heart disease Maternal Grandmother   . Heart disease Maternal Grandfather   . Hyperlipidemia Maternal Grandfather   . Hypertension Maternal Grandfather     Social History Social History  Substance Use Topics  . Smoking status: Current Every Day Smoker -- 1.00 packs/day for 21 years  . Smokeless tobacco: None  . Alcohol Use: 0.0 oz/week    0 Standard drinks or equivalent per week     Comment: 1-2 drinks twice a week    Review of Systems  Constitutional: Negative for fever. Eyes: Negative for visual changes. ENT: Negative for sore throat Cardiovascular: Negative for chest pain. Respiratory: Negative for shortness of breath. As above Gastrointestinal: Negative for abdominal pain, vomiting and diarrhea. Genitourinary: Negative for dysuria. Musculoskeletal: Negative for back pain. Skin: Negative for rash. Neurological: Negative for headaches or focal weakness Psychiatric: Mild anxiety    ____________________________________________   PHYSICAL EXAM:  VITAL SIGNS: ED Triage Vitals  Enc Vitals Group     BP 05/21/15 1835 109/71 mmHg     Pulse Rate 05/21/15 1835 88     Resp 05/21/15 1835 18  Temp 05/21/15 1835 98.7 F (37.1 C)     Temp Source 05/21/15 1835 Oral     SpO2 05/21/15 1835 99 %     Weight 05/21/15 1835 187 lb (84.823 kg)      Height 05/21/15 1835 5\' 4"  (1.626 m)     Head Cir --      Peak Flow --      Pain Score 05/21/15 1833 5     Pain Loc --      Pain Edu? --      Excl. in [place]? --      Constitutional: Alert and oriented. Well appearing and in no distress. Eyes: Conjunctivae are normal.  ENT   Head: Normocephalic and atraumatic.   Mouth/Throat: Mucous membranes are moist. Cardiovascular: Normal rate, regular rhythm. Normal and symmetric distal pulses are present in all extremities. No murmurs, rubs, or gallops. Respiratory: Normal respiratory effort without tachypnea nor retractions. Breath sounds are clear and equal bilaterally.  Gastrointestinal: Soft and non-tender in all quadrants. No distention. There is no CVA tenderness. Genitourinary: deferred Musculoskeletal: Nontender with normal range of motion in all extremities. No lower extremity tenderness nor edema. Neurologic:  Normal speech and language. No gross focal neurologic deficits are appreciated. Skin:  Skin is warm, dry and intact. No rash noted. Psychiatric: Mood and affect are normal. Patient exhibits appropriate insight and judgment.  ____________________________________________    LABS (pertinent positives/negatives)  Labs Reviewed  BASIC METABOLIC PANEL - Abnormal; Notable for the following:    Glucose, Bld 107 (*)    All other components within normal limits  CBC  TROPONIN I  FIBRIN DERIVATIVES D-DIMER ([facility] ONLY)    ____________________________________________   EKG  ED ECG REPORT I, Lavonia Drafts, the attending physician, personally viewed and interpreted this ECG.  Date: 06/03/2015 EKG Time: 6:37  Rate: 84 Rhythm: normal sinus rhythm QRS Axis: normal Intervals: normal ST/T Wave abnormalities: normal Conduction Disutrbances: none Narrative Interpretation: unremarkable   ____________________________________________    RADIOLOGY I have personally reviewed any xrays that were ordered on this  patient: Chest x-ray unremarkable  ____________________________________________   PROCEDURES  Procedure(s) performed: none  Critical Care performed: none  ____________________________________________   INITIAL IMPRESSION / ASSESSMENT AND PLAN / ED COURSE  Pertinent labs & imaging results that were available during my care of the patient were reviewed by me and considered in my medical decision making (see chart for details).  Patient well-appearing and in no distress. History of present illness not consistent with ACS, dissection, myocarditis. PE is unlikely but we will send a d-dimer.  Suspect bronchospasm given history of cigarette smoking, we will give DuoNeb to see if this improves her symptoms.  D-dimer negative. DuoNeb significantly improved her symptoms. We'll prescribe 5 days of steroids for bronchospasm. Return precautions discussed extensively. Patient agrees with plan.  ____________________________________________   FINAL CLINICAL IMPRESSION(S) / ED DIAGNOSES  Final diagnoses:  Bronchospasm, acute     Lavonia Drafts, MD 05/21/15 [id number]  Lavonia Drafts, MD 06/03/15 [phone]

## 2015-05-21 NOTE — Discharge Instructions (Signed)
Bronchospasm, Adult  A bronchospasm is a spasm or tightening of the airways going into the lungs. During a bronchospasm breathing becomes more difficult because the airways get smaller. When this happens there can be coughing, a whistling sound when breathing (wheezing), and difficulty breathing. Bronchospasm is often associated with asthma, but not all patients who experience a bronchospasm have asthma.  CAUSES   A bronchospasm is caused by inflammation or irritation of the airways. The inflammation or irritation may be triggered by:   · Allergies (such as to animals, pollen, food, or mold). Allergens that cause bronchospasm may cause wheezing immediately after exposure or many hours later.    · Infection. Viral infections are believed to be the most common cause of bronchospasm.    · Exercise.    · Irritants (such as pollution, cigarette smoke, strong odors, aerosol sprays, and paint fumes).    · Weather changes. Winds increase molds and pollens in the air. Rain refreshes the air by washing irritants out. Cold air may cause inflammation.    · Stress and emotional upset.    SIGNS AND SYMPTOMS   · Wheezing.    · Excessive nighttime coughing.    · Frequent or severe coughing with a simple cold.    · Chest tightness.    · Shortness of breath.    DIAGNOSIS   Bronchospasm is usually diagnosed through a history and physical exam. Tests, such as chest X-rays, are sometimes done to look for other conditions.  TREATMENT   · Inhaled medicines can be given to open up your airways and help you breathe. The medicines can be given using either an inhaler or a nebulizer machine.  · Corticosteroid medicines may be given for severe bronchospasm, usually when it is associated with asthma.  HOME CARE INSTRUCTIONS   · Always have a plan prepared for seeking medical care. Know when to call your health care provider and local emergency services (911 in the [place] Know where you can access local emergency care.  · Only take medicines as  directed by your health care provider.  · If you were prescribed an inhaler or nebulizer machine, ask your health care provider to explain how to use it correctly. Always use a spacer with your inhaler if you were given one.  · It is necessary to remain calm during an attack. Try to relax and breathe more slowly.   · Control your home environment in the following ways:      Change your heating and air conditioning filter at least once a month.      Limit your use of fireplaces and wood stoves.    Do not smoke and do not allow smoking in your home.      Avoid exposure to perfumes and fragrances.      Get rid of pests (such as roaches and mice) and their droppings.      Throw away plants if you see mold on them.      Keep your house clean and dust free.      Replace carpet with wood, tile, or vinyl flooring. Carpet can trap dander and dust.      Use allergy-proof pillows, mattress covers, and box spring covers.      Wash bed sheets and blankets every week in hot water and dry them in a dryer.      Use blankets that are made of polyester or cotton.      Wash hands frequently.  SEEK MEDICAL CARE IF:   · You have muscle aches.    · You have chest pain.    · The sputum changes from clear or   white to yellow, green, gray, or bloody.    · The sputum you cough up gets thicker.    · There are problems that may be related to the medicine you are given, such as a rash, itching, swelling, or trouble breathing.    SEEK IMMEDIATE MEDICAL CARE IF:   · You have worsening wheezing and coughing even after taking your prescribed medicines.    · You have increased difficulty breathing.    · You develop severe chest pain.  MAKE SURE YOU:   · Understand these instructions.  · Will watch your condition.  · Will get help right away if you are not doing well or get worse.     This information is not intended to replace advice given to you by your health care provider. Make sure you discuss any questions you have with your health care  provider.     Document Released: 05/26/2003 Document Revised: 06/13/2014 Document Reviewed: 11/12/2012  Elsevier Interactive Patient Education ©2016 [employer]

## 2015-05-21 NOTE — ED Notes (Signed)
Lab called for add on D-Dimer

## 2015-05-25 ENCOUNTER — Encounter: Payer: Self-pay | Admitting: Nurse Practitioner

## 2015-05-25 DIAGNOSIS — R079 Chest pain, unspecified: Secondary | ICD-10-CM | POA: Insufficient documentation

## 2015-05-25 NOTE — Assessment & Plan Note (Addendum)
Unknown etiology at this time. New onset. CBC w/ diff, cmet, d-dimer, and troponin I drawn today. EKG today was NSR without tachycardia today and similar to 2008's. Make sure Nexium is 30 minutes before largest meal to r/out heartburn. Will follow by MyChart and phone after results.   I have spent 25 minutes face to face with greater than 50% of the time spent on obtaining HPI and ROS, EKG and interpreting results, discussing options, and reviewing past medical records with pt.

## 2015-06-15 ENCOUNTER — Ambulatory Visit: Payer: BLUE CROSS/BLUE SHIELD | Admitting: Nurse Practitioner

## 2015-06-22 ENCOUNTER — Ambulatory Visit (INDEPENDENT_AMBULATORY_CARE_PROVIDER_SITE_OTHER): Payer: 59 | Admitting: Nurse Practitioner

## 2015-06-22 ENCOUNTER — Encounter: Payer: Self-pay | Admitting: Nurse Practitioner

## 2015-06-22 VITALS — BP 98/82 | HR 98 | Temp 98.0°F | Resp 16 | Ht 64.0 in | Wt 198.8 lb

## 2015-06-22 DIAGNOSIS — M797 Fibromyalgia: Secondary | ICD-10-CM | POA: Diagnosis not present

## 2015-06-22 DIAGNOSIS — Z760 Encounter for issue of repeat prescription: Secondary | ICD-10-CM | POA: Insufficient documentation

## 2015-06-22 DIAGNOSIS — Z8709 Personal history of other diseases of the respiratory system: Secondary | ICD-10-CM | POA: Diagnosis not present

## 2015-06-22 DIAGNOSIS — Z658 Other specified problems related to psychosocial circumstances: Secondary | ICD-10-CM

## 2015-06-22 DIAGNOSIS — F439 Reaction to severe stress, unspecified: Secondary | ICD-10-CM

## 2015-06-22 DIAGNOSIS — Z716 Tobacco abuse counseling: Secondary | ICD-10-CM

## 2015-06-22 MED ORDER — AMOXICILLIN-POT CLAVULANATE 875-125 MG PO TABS: 1.0000 | ORAL_TABLET | Freq: Two times a day (BID) | ORAL | Status: DC

## 2015-06-22 MED ORDER — HYDROCODONE-ACETAMINOPHEN 5-325 MG PO TABS: ORAL_TABLET | ORAL | Status: DC

## 2015-06-22 MED ORDER — FLUTICASONE PROPIONATE 50 MCG/ACT NA SUSP: 2.0000 | Freq: Every day | NASAL | Status: DC

## 2015-06-22 MED ORDER — CYCLOBENZAPRINE HCL 5 MG PO TABS: 5.0000 mg | ORAL_TABLET | Freq: Three times a day (TID) | ORAL | Status: DC | PRN

## 2015-06-22 MED ORDER — ONDANSETRON 4 MG PO TBDP: ORAL_TABLET | ORAL | Status: DC

## 2015-06-22 MED ORDER — LORAZEPAM 1 MG PO TABS: 1.0000 mg | ORAL_TABLET | Freq: Every evening | ORAL | Status: DC | PRN

## 2015-06-22 NOTE — Patient Instructions (Addendum)
Let me know what Dr. Darnell Level may want lab wise and we can do them here.   Work on decreasing stress!   Let me know if there is a different pharmacy you would like

## 2015-06-22 NOTE — Progress Notes (Signed)
Patient ID: Brooke Rush, female    DOB: 06/16/1975  Age: 40 y.o. MRN: [id number]  CC: Follow-up   HPI Brooke Rush presents for follow up of fatigue, chest pain, and CC of sinusitis.   1) Fatigue- Worse Sleep- Trouble falling asleep  Diet- Healthy diet she reports, tried cutting back on caffeine- didn't make a difference  Stress- Lorazepam nightly, stressing due to work  Vitamins- None  PHQ-2= 0   2) Thoracic outlet right side- pain bothering her Therapy, alternative medicine  Regular massages  Stress worsens this  3) Chest pain- stable/improving   Bronchospasm improved from hospital visit in December   4) Maxillary tenderness x 2 weeks Rhinorrhea- clear  Worse with forward bending Not used flonase in awhile Used sudafed without relief  Netipot temp helpful  On Ceftin recently without relief  Denies sick contacts  History Kaytlin has a past medical history of Migraines; GERD (gastroesophageal reflux disease); Kidney stones; Fibromyalgia; Plantar fasciitis; Constipation; ETD (eustachian tube dysfunction); Otorrhea; and Kidney stones.   She has past surgical history that includes Cholecystectomy (2011); Tonsillectomy and adenoidectomy (2013); Carpal tunnel release (Bilateral, 1998); Nasal sinus surgery (2011); Nasal septum surgery (2011); and Hemorroidectomy.   Her family history includes Alcohol abuse in her father; Arthritis in her mother; Heart disease in her father, maternal grandfather, and maternal grandmother; Hyperlipidemia in her father and maternal grandfather; Hypertension in her maternal grandfather; Kidney disease in her maternal grandmother; Obesity in her mother.She reports that she has been smoking.  She does not have any smokeless tobacco history on file. She reports that she drinks alcohol. She reports that she does not use illicit drugs.  Outpatient Prescriptions Prior to Visit  Medication Sig Dispense Refill  . b complex vitamins tablet Take 1  tablet by mouth daily.    . cetirizine (ZYRTEC) 10 MG tablet Take 10 mg by mouth daily.    . cholecalciferol (VITAMIN D) 1000 UNITS tablet Take 2,000 Units by mouth daily.    Marland Kitchen CIPRODEX otic suspension Reported on 06/22/2015  6  . esomeprazole (NEXIUM) 40 MG capsule Take 1 capsule (40 mg total) by mouth 2 (two) times daily. 180 capsule 1  . ofloxacin (OCUFLOX) 0.3 % ophthalmic solution PLACE 4 TO 5 DROPS INTO AFFECTED EAR TWICE A DAY FOR 21 DAYS  3  . Omega-3 Fatty Acids (FISH OIL) 1000 MG CAPS Take 1,000 mg by mouth 1 day or 1 dose.    . triamcinolone ointment (KENALOG) 0.5 % Apply 1 application topically 2 (two) times daily. 30 g 0  . valACYclovir (VALTREX) 1000 MG tablet TAKE 1 TABLET TWICE A DAY 90 tablet 4  . vitamin E 1000 UNIT capsule Take 1,000 Units by mouth daily.    . celecoxib (CELEBREX) 200 MG capsule Take 200 mg by mouth daily as needed.    . cyclobenzaprine (FLEXERIL) 5 MG tablet Take 1 tablet (5 mg total) by mouth 3 (three) times daily as needed for muscle spasms. 90 tablet 1  . HYDROcodone-acetaminophen (NORCO/VICODIN) 5-325 MG per tablet Take 1 tablet once a day as needed for pain 30 tablet 0  . LORazepam (ATIVAN) 1 MG tablet Take 1 tablet (1 mg total) by mouth at bedtime as needed for anxiety. 90 tablet 1  . ondansetron (ZOFRAN-ODT) 4 MG disintegrating tablet PLACE 1 TABLET ON TONGUE EVERY 8 HOURS AS NEEDED FOR NAUSEA OR VOMITING 20 tablet 1  . fluconazole (DIFLUCAN) 100 MG tablet Reported on 06/22/2015  1  . gabapentin (  NEURONTIN) 300 MG capsule Take by mouth.    . predniSONE (DELTASONE) 50 MG tablet Take 1 tablet (50 mg total) by mouth daily with breakfast. (Patient not taking: Reported on 06/22/2015) 5 tablet 0  . varenicline (CHANTIX STARTING MONTH PAK) 0.5 MG X 11 & 1 MG X 42 tablet Take one 0.5 mg tablet by mouth once daily for 3 days, then increase to one 0.5 mg tablet twice daily for 4 days, then increase to one 1 mg tablet twice daily. (Patient not taking: Reported on  06/22/2015) 53 tablet 0   No facility-administered medications prior to visit.    ROS Review of Systems  Constitutional: Positive for fatigue. Negative for fever, chills and diaphoresis.  Respiratory: Positive for chest tightness. Negative for shortness of breath and wheezing.   Cardiovascular: Negative for chest pain, palpitations and leg swelling.  Gastrointestinal: Negative for nausea, vomiting and diarrhea.  Musculoskeletal: Positive for myalgias.  Skin: Negative for rash.  Neurological: Negative for dizziness, weakness, numbness and headaches.  Psychiatric/Behavioral: Positive for sleep disturbance. Negative for suicidal ideas. The patient is nervous/anxious.     Objective:  BP 98/82 mmHg  Pulse 98  Temp(Src) 98 F (36.7 C) (Oral)  Resp 16  Ht 5\' 4"  (1.626 m)  Wt 198 lb 12.8 oz (90.175 kg)  BMI 34.11 kg/m2  SpO2 97%  LMP 06/03/2015  Physical Exam  Constitutional: She is oriented to person, place, and time. She appears well-developed and well-nourished. No distress.  HENT:  Head: Normocephalic and atraumatic.  Right Ear: External ear normal.  Left Ear: External ear normal.  Mouth/Throat: No oropharyngeal exudate.  Tubes in ears, no drainage  Slight cerumen around the tubes Maxillary tenderness  Eyes: EOM are normal. Pupils are equal, round, and reactive to light. Right eye exhibits no discharge. Left eye exhibits no discharge. No scleral icterus.  Neck: Normal range of motion. Neck supple.  Cardiovascular: Normal rate, regular rhythm and normal heart sounds.  Exam reveals no gallop and no friction rub.   No murmur heard. Pulmonary/Chest: Effort normal and breath sounds normal. No respiratory distress. She has no wheezes. She has no rales. She exhibits no tenderness.  Musculoskeletal: Normal range of motion. She exhibits tenderness. She exhibits no edema.  Lymphadenopathy:    She has no cervical adenopathy.  Neurological: She is alert and oriented to person, place,  and time. No cranial nerve deficit. She exhibits normal muscle tone. Coordination normal.  Skin: Skin is warm and dry. No rash noted. She is not diaphoretic.  Psychiatric: She has a normal mood and affect. Her behavior is normal. Judgment and thought content normal.  Improved mood today   Assessment & Plan:   Brittan was seen today for follow-up.  Diagnoses and all orders for this visit:  Medication refill -     HYDROcodone-acetaminophen (NORCO/VICODIN) 5-325 MG tablet; Take 1 tablet once a day as needed for pain  H/O acute bronchitis with bronchospasm -     Pulmonary function test; Future  Tobacco abuse counseling -     Pulmonary function test; Future  Fibromyalgia  Stress  Other orders -     cyclobenzaprine (FLEXERIL) 5 MG tablet; Take 1 tablet (5 mg total) by mouth 3 (three) times daily as needed for muscle spasms. -     LORazepam (ATIVAN) 1 MG tablet; Take 1 tablet (1 mg total) by mouth at bedtime as needed for anxiety. -     ondansetron (ZOFRAN-ODT) 4 MG disintegrating tablet; PLACE 1 TABLET ON TONGUE  EVERY 8 HOURS AS NEEDED FOR NAUSEA OR VOMITING -     amoxicillin-clavulanate (AUGMENTIN) 875-125 MG tablet; Take 1 tablet by mouth 2 (two) times daily. -     fluticasone (FLONASE) 50 MCG/ACT nasal spray; Place 2 sprays into both nostrils daily.   I have discontinued [name] Terry's celecoxib, varenicline, gabapentin, fluconazole, and predniSONE. I have also changed her HYDROcodone-acetaminophen. Additionally, I am having her start on amoxicillin-clavulanate and fluticasone. Lastly, I am having her maintain her Fish Oil, vitamin E, b complex vitamins, esomeprazole, cholecalciferol, cetirizine, triamcinolone ointment, CIPRODEX, ofloxacin, valACYclovir, acetaminophen, cyclobenzaprine, LORazepam, and ondansetron.  Meds ordered this encounter  Medications  . acetaminophen (TYLENOL) 500 MG tablet    Sig: Take by mouth.  . cyclobenzaprine (FLEXERIL) 5 MG tablet    Sig: Take 1  tablet (5 mg total) by mouth 3 (three) times daily as needed for muscle spasms.    Dispense:  90 tablet    Refill:  0    Order Specific Question:  Supervising Provider    Answer:  Deborra Medina L [[id number]]  . HYDROcodone-acetaminophen (NORCO/VICODIN) 5-325 MG tablet    Sig: Take 1 tablet once a day as needed for pain    Dispense:  30 tablet    Refill:  0    Order Specific Question:  Supervising Provider    Answer:  Deborra Medina L [[id number]]  . LORazepam (ATIVAN) 1 MG tablet    Sig: Take 1 tablet (1 mg total) by mouth at bedtime as needed for anxiety.    Dispense:  30 tablet    Refill:  0    Order Specific Question:  Supervising Provider    Answer:  Deborra Medina L [[id number]]  . ondansetron (ZOFRAN-ODT) 4 MG disintegrating tablet    Sig: PLACE 1 TABLET ON TONGUE EVERY 8 HOURS AS NEEDED FOR NAUSEA OR VOMITING    Dispense:  20 tablet    Refill:  0    Order Specific Question:  Supervising Provider    Answer:  Deborra Medina L [[id number]]  . amoxicillin-clavulanate (AUGMENTIN) 875-125 MG tablet    Sig: Take 1 tablet by mouth 2 (two) times daily.    Dispense:  10 tablet    Refill:  0    Order Specific Question:  Supervising Provider    Answer:  Deborra Medina L [[id number]]  . fluticasone (FLONASE) 50 MCG/ACT nasal spray    Sig: Place 2 sprays into both nostrils daily.    Dispense:  16 g    Refill:  0    Order Specific Question:  Supervising Provider    Answer:  Crecencio Mc [[id number]]     Follow-up: Return in about 6 months (around 12/20/2015) for Follow up.

## 2015-06-26 NOTE — Assessment & Plan Note (Signed)
No ready to quit due to stress. Advised when she is ready, we will help her

## 2015-06-26 NOTE — Assessment & Plan Note (Signed)
Pt has not had PFT's in a long time. Will set this up. Pt went to ED for breathing treatment in past month.

## 2015-06-26 NOTE — Assessment & Plan Note (Signed)
Medication refills were discussed and if appropriate filled. Vicodin and Ativan decreasing usage was discussed. 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 She understands the potential for respiratory depression and possibly death when taken together or too close. Advised at least 6 hrs apart. Pt agreeable. [place] checked for compliance.

## 2015-06-26 NOTE — Assessment & Plan Note (Signed)
Stress is worsening symptoms.  Refills of medications given, asked her to incorporate exercise.

## 2015-06-26 NOTE — Assessment & Plan Note (Signed)
Current stressors at job and family are worsening her fibromyalgia. She does seem in better health today compared with last visit. Advised she needed to find healthy ways to deal with stress like healthy diet and exercise.

## 2015-07-17 ENCOUNTER — Telehealth: Payer: Self-pay | Admitting: Internal Medicine

## 2015-07-17 NOTE — Telephone Encounter (Signed)
Labs are in: 3x cortisol and 1 acth. She needs to present at 8 in the morning for this test. Results will take up to 3 days to return (ACTH takes the longest)

## 2015-07-17 NOTE — Telephone Encounter (Signed)
Please read message below and advise.  

## 2015-07-17 NOTE — Telephone Encounter (Signed)
Pt said she was supposed to be seeing Dr. Cruzita Lederer the first of the year but does not have her new insurance yet but she would like labs to be ordered for her and to be sent to [facility] if possible and for also someone to call and advise as to what labs she would be needing and how long it takes to get the results.

## 2015-07-20 ENCOUNTER — Telehealth: Payer: Self-pay | Admitting: Internal Medicine

## 2015-07-20 NOTE — Telephone Encounter (Signed)
Please read message below and advise.  

## 2015-07-20 NOTE — Telephone Encounter (Signed)
Pt is returning call, can we do the vitamin b level again too

## 2015-07-20 NOTE — Telephone Encounter (Signed)
Called pt and lvm explaining all of Dr Arman Filter message to patient. Also advised pt that she will need to call and schedule an 8 am appt for this test. Advised her if she had any further questions to call our office.

## 2015-07-21 ENCOUNTER — Other Ambulatory Visit: Payer: Self-pay | Admitting: *Deleted

## 2015-07-21 DIAGNOSIS — R5383 Other fatigue: Secondary | ICD-10-CM

## 2015-07-21 NOTE — Telephone Encounter (Signed)
Called pt and lvm advising her that Dr Cruzita Lederer said ok to add a B12 level.

## 2015-07-21 NOTE — Telephone Encounter (Signed)
OK to add a B12 level

## 2015-07-24 ENCOUNTER — Other Ambulatory Visit: Payer: Self-pay | Admitting: *Deleted

## 2015-07-24 ENCOUNTER — Other Ambulatory Visit (INDEPENDENT_AMBULATORY_CARE_PROVIDER_SITE_OTHER): Payer: 59

## 2015-07-24 DIAGNOSIS — E2740 Unspecified adrenocortical insufficiency: Secondary | ICD-10-CM

## 2015-07-24 DIAGNOSIS — R5383 Other fatigue: Secondary | ICD-10-CM

## 2015-07-24 DIAGNOSIS — R7989 Other specified abnormal findings of blood chemistry: Secondary | ICD-10-CM

## 2015-07-24 LAB — CORTISOL
CORTISOL PLASMA: 24.4 ug/dL
Cortisol, Plasma: 7.2 ug/dL
Cortisol, Plasma: 20.1 ug/dL

## 2015-07-24 LAB — VITAMIN B12: Vitamin B-12: 222 pg/mL (ref 211–911)

## 2015-07-24 LAB — VITAMIN D 25 HYDROXY (VIT D DEFICIENCY, FRACTURES): VITD: 27.18 ng/mL — AB (ref 30.00–100.00)

## 2015-07-24 MED ORDER — COSYNTROPIN 0.25 MG IJ SOLR
0.2500 mg | Freq: Once | INTRAMUSCULAR | Status: AC
  Administered 2015-07-24: 0.25 mg via INTRAMUSCULAR

## 2015-07-24 NOTE — Addendum Note (Signed)
Addended by: Kaylyn Lim I on: 07/24/2015 11:47 AM   Modules accepted: Orders

## 2015-07-24 NOTE — Addendum Note (Signed)
Addended by: Kaylyn Lim I on: 07/24/2015 09:33 AM   Modules accepted: Orders

## 2015-07-24 NOTE — Addendum Note (Signed)
Addended by: Kaylyn Lim I on: 07/24/2015 10:02 AM   Modules accepted: Orders

## 2015-07-27 LAB — ACTH: C206 ACTH: 6 pg/mL (ref 6–50)

## 2015-08-11 ENCOUNTER — Telehealth: Payer: Self-pay | Admitting: Internal Medicine

## 2015-08-11 NOTE — Telephone Encounter (Signed)
Error-pt calling to speak with Larene Beach on a personal level

## 2015-08-25 ENCOUNTER — Telehealth: Payer: Self-pay | Admitting: Nurse Practitioner

## 2015-08-25 NOTE — Telephone Encounter (Signed)
Paper work for disability placard put in [place] box.

## 2015-08-31 ENCOUNTER — Encounter: Payer: Self-pay | Admitting: Nurse Practitioner

## 2015-10-15 ENCOUNTER — Encounter: Payer: Self-pay | Admitting: Nurse Practitioner

## 2015-10-15 ENCOUNTER — Other Ambulatory Visit: Payer: Self-pay | Admitting: Nurse Practitioner

## 2015-10-15 MED ORDER — ZOLMITRIPTAN 5 MG PO TABS: 5.0000 mg | ORAL_TABLET | ORAL | Status: DC | PRN

## 2015-10-15 MED ORDER — TOPIRAMATE 100 MG PO TABS: 100.0000 mg | ORAL_TABLET | Freq: Two times a day (BID) | ORAL | Status: DC

## 2015-12-02 ENCOUNTER — Ambulatory Visit: Payer: Managed Care, Other (non HMO) | Admitting: Anesthesiology

## 2015-12-02 ENCOUNTER — Ambulatory Visit: Payer: 59 | Admitting: Nurse Practitioner

## 2015-12-02 ENCOUNTER — Ambulatory Visit
Admission: RE | Admit: 2015-12-02 | Discharge: 2015-12-02 | Disposition: A | Discharge status: Home / Self Care | Payer: Managed Care, Other (non HMO) | Source: Ambulatory Visit | Attending: Otolaryngology | Admitting: Otolaryngology

## 2015-12-02 ENCOUNTER — Encounter
Admission: RE | Disposition: A | Discharge status: Home / Self Care | Payer: Self-pay | Source: Ambulatory Visit | Attending: Otolaryngology

## 2015-12-02 DIAGNOSIS — J320 Chronic maxillary sinusitis: Secondary | ICD-10-CM | POA: Diagnosis not present

## 2015-12-02 DIAGNOSIS — F17200 Nicotine dependence, unspecified, uncomplicated: Secondary | ICD-10-CM | POA: Insufficient documentation

## 2015-12-02 DIAGNOSIS — H7293 Unspecified perforation of tympanic membrane, bilateral: Secondary | ICD-10-CM | POA: Insufficient documentation

## 2015-12-02 DIAGNOSIS — H6693 Otitis media, unspecified, bilateral: Secondary | ICD-10-CM | POA: Insufficient documentation

## 2015-12-02 HISTORY — PX: MAXILLARY ANTROSTOMY: SHX2003

## 2015-12-02 HISTORY — PX: REMOVAL OF EAR TUBE: SHX6057

## 2015-12-02 HISTORY — PX: FRONTAL SINUS EXPLORATION: SHX6591

## 2015-12-02 HISTORY — PX: IMAGE GUIDED SINUS SURGERY: SHX6570

## 2015-12-02 HISTORY — DX: Unspecified osteoarthritis, unspecified site: M19.90

## 2015-12-02 HISTORY — PX: ETHMOIDECTOMY: SHX5197

## 2015-12-02 SURGERY — SINUS SURGERY, WITH IMAGING GUIDANCE
Anesthesia: General

## 2015-12-02 MED ORDER — OXYCODONE HCL 5 MG/5ML PO SOLN: 10.0000 mg | ORAL | Status: DC | PRN

## 2015-12-02 MED ORDER — FENTANYL CITRATE (PF) 100 MCG/2ML IJ SOLN
25.0000 ug | INTRAMUSCULAR | Status: AC | PRN
  Administered 2015-12-02: 100 ug via INTRAVENOUS
  Administered 2015-12-02: 50 ug via INTRAVENOUS
  Administered 2015-12-02 (×2): 25 ug via INTRAVENOUS

## 2015-12-02 MED ORDER — PROPOFOL 10 MG/ML IV BOLUS
INTRAVENOUS | Status: DC | PRN
  Administered 2015-12-02: 200 mg via INTRAVENOUS

## 2015-12-02 MED ORDER — GLYCOPYRROLATE 0.2 MG/ML IJ SOLN
INTRAMUSCULAR | Status: DC | PRN
  Administered 2015-12-02: .1 mg via INTRAVENOUS

## 2015-12-02 MED ORDER — OXYMETAZOLINE HCL 0.05 % NA SOLN
NASAL | Status: DC | PRN
  Administered 2015-12-02: 1 via TOPICAL

## 2015-12-02 MED ORDER — ACETAMINOPHEN 10 MG/ML IV SOLN
1000.0000 mg | Freq: Once | INTRAVENOUS | Status: AC
  Administered 2015-12-02: 1000 mg via INTRAVENOUS

## 2015-12-02 MED ORDER — HYDROMORPHONE HCL 1 MG/ML IJ SOLN
0.5000 mg | INTRAMUSCULAR | Status: DC | PRN
  Administered 2015-12-02 (×2): 0.5 mg via INTRAVENOUS

## 2015-12-02 MED ORDER — ONDANSETRON HCL 4 MG/2ML IJ SOLN
4.0000 mg | Freq: Once | INTRAMUSCULAR | Status: AC | PRN
  Administered 2015-12-02: 4 mg via INTRAVENOUS

## 2015-12-02 MED ORDER — PROMETHAZINE HCL 12.5 MG PO TABS: 12.5000 mg | ORAL_TABLET | Freq: Four times a day (QID) | ORAL | Status: DC | PRN

## 2015-12-02 MED ORDER — AMOXICILLIN-POT CLAVULANATE 875-125 MG PO TABS: 1.0000 | ORAL_TABLET | Freq: Two times a day (BID) | ORAL | Status: DC

## 2015-12-02 MED ORDER — LIDOCAINE-EPINEPHRINE 1 %-1:100000 IJ SOLN
INTRAMUSCULAR | Status: DC | PRN
  Administered 2015-12-02: 7 mL

## 2015-12-02 MED ORDER — ROCURONIUM BROMIDE 100 MG/10ML IV SOLN
INTRAVENOUS | Status: DC | PRN
  Administered 2015-12-02: 30 mg via INTRAVENOUS

## 2015-12-02 MED ORDER — GELATIN ABSORBABLE EX FILM
ORAL_FILM | CUTANEOUS | Status: DC | PRN
  Administered 2015-12-02: 1 via TOPICAL

## 2015-12-02 MED ORDER — OXYCODONE HCL 5 MG PO TABS
5.0000 mg | ORAL_TABLET | Freq: Once | ORAL | Status: AC
  Administered 2015-12-02: 5 mg via ORAL

## 2015-12-02 MED ORDER — FENTANYL CITRATE (PF) 100 MCG/2ML IJ SOLN
25.0000 ug | INTRAMUSCULAR | Status: DC | PRN
  Administered 2015-12-02 (×4): 25 ug via INTRAVENOUS

## 2015-12-02 MED ORDER — SCOPOLAMINE 1 MG/3DAYS TD PT72
1.0000 | MEDICATED_PATCH | Freq: Once | TRANSDERMAL | Status: DC
  Administered 2015-12-02: 1.5 mg via TRANSDERMAL

## 2015-12-02 MED ORDER — DEXAMETHASONE SODIUM PHOSPHATE 4 MG/ML IJ SOLN
INTRAMUSCULAR | Status: DC | PRN
  Administered 2015-12-02: 10 mg via INTRAVENOUS

## 2015-12-02 MED ORDER — MIDAZOLAM HCL 5 MG/5ML IJ SOLN
INTRAMUSCULAR | Status: DC | PRN
  Administered 2015-12-02: 2 mg via INTRAVENOUS

## 2015-12-02 MED ORDER — LIDOCAINE HCL (CARDIAC) 20 MG/ML IV SOLN
INTRAVENOUS | Status: DC | PRN
  Administered 2015-12-02: 40 mg via INTRAVENOUS

## 2015-12-02 MED ORDER — LACTATED RINGERS IV SOLN
INTRAVENOUS | Status: DC
  Administered 2015-12-02: 09:00:00 via INTRAVENOUS

## 2015-12-02 SURGICAL SUPPLY — 33 items
BALLOON SINUPLASTY SYSTEM (BALLOONS) ×5 IMPLANT
BATTERY INSTRU NAVIGATION (MISCELLANEOUS) ×20 IMPLANT
BLADE IRRIGATOR 40D CVD (IRRIGATION / IRRIGATOR) IMPLANT
BLADE MYR LANCE NRW W/HDL (BLADE) ×5 IMPLANT
CANISTER SUCT 1200ML W/VALVE (MISCELLANEOUS) ×5 IMPLANT
COAG SUCT 10F 3.5MM HAND CTRL (MISCELLANEOUS) ×5 IMPLANT
COTTONBALL LRG STERILE PKG (GAUZE/BANDAGES/DRESSINGS) ×5 IMPLANT
DEVICE INFLATION SEID (MISCELLANEOUS) ×5 IMPLANT
DRAPE HEAD BAR (DRAPES) ×5 IMPLANT
DRESSING NASL FOAM PST OP SINU (MISCELLANEOUS) ×6 IMPLANT
DRSG NASAL 4CM NASOPORE (MISCELLANEOUS) IMPLANT
DRSG NASAL FOAM POST OP SINU (MISCELLANEOUS) ×10
GLOVE BIO SURGEON STRL SZ7.5 (GLOVE) ×10 IMPLANT
IRRIGATOR 4MM STR (IRRIGATION / IRRIGATOR) ×5 IMPLANT
IV NS 500ML (IV SOLUTION) ×2
IV NS 500ML BAXH (IV SOLUTION) ×3 IMPLANT
KIT ROOM TURNOVER OR (KITS) ×5 IMPLANT
NAVIGATION MASK REG  ST (MISCELLANEOUS) ×5 IMPLANT
NS IRRIG 500ML POUR BTL (IV SOLUTION) ×5 IMPLANT
PACK DRAPE NASAL/ENT (PACKS) ×5 IMPLANT
PACKING NASAL EPIS 4X2.4 XEROG (MISCELLANEOUS) ×10 IMPLANT
PAD GROUND ADULT SPLIT (MISCELLANEOUS) ×5 IMPLANT
PATTIES SURGICAL .5 X3 (DISPOSABLE) ×5 IMPLANT
SET HANDPIECE IRR DIEGO (MISCELLANEOUS) ×5 IMPLANT
SINUPLASTY SPHENOID GUIDE (MISCELLANEOUS) IMPLANT
SOL ANTI-FOG 6CC FOG-OUT (MISCELLANEOUS) ×3 IMPLANT
SOL FOG-OUT ANTI-FOG 6CC (MISCELLANEOUS) ×2
STRAP BODY AND KNEE 60X3 (MISCELLANEOUS) ×5 IMPLANT
SYRINGE 10CC LL (SYRINGE) ×5 IMPLANT
TOWEL OR 17X26 4PK STRL BLUE (TOWEL DISPOSABLE) ×5 IMPLANT
TUBING CONN 6MMX3.1M (TUBING) ×2
TUBING SUCTION CONN 0.25 STRL (TUBING) ×3 IMPLANT
WATER STERILE IRR 500ML POUR (IV SOLUTION) ×5 IMPLANT

## 2015-12-02 NOTE — H&P (Signed)
..  History and Physical paper copy reviewed and updated date of procedure and will be scanned into system.  

## 2015-12-02 NOTE — Addendum Note (Signed)
Addendum  created 12/02/15 1413 by Ronelle Nigh, MD   Modules edited: Orders

## 2015-12-02 NOTE — Anesthesia Postprocedure Evaluation (Signed)
Anesthesia Post Note  Patient: Brooke Rush  Procedure(s) Performed: Procedure(s) (LRB): IMAGE GUIDED SINUS SURGERY (N/A) FRONTAL SINUSOTOMY (Bilateral) LEFT TOTAL ETHMOIDECTOMY  RIGHT  REVISION TOTAL ETHMOIDECTOMY (Bilateral) MAXILLARY ANTROSTOMY (Left) EXAM UNDER ANESTHESIA (Bilateral) Bilateral Tube removal with Gel Film Myringoplasty (Bilateral)  Patient location during evaluation: PACU Anesthesia Type: General Level of consciousness: awake and alert and oriented Pain management: satisfactory to patient Vital Signs Assessment: post-procedure vital signs reviewed and stable Respiratory status: spontaneous breathing, nonlabored ventilation and respiratory function stable Cardiovascular status: blood pressure returned to baseline and stable Postop Assessment: Adequate PO intake and No signs of nausea or vomiting Anesthetic complications: no    Raliegh Ip

## 2015-12-02 NOTE — Anesthesia Preprocedure Evaluation (Signed)
Anesthesia Evaluation  Patient identified by MRN, date of birth, ID band  Reviewed: Allergy & Precautions, H&P , NPO status , Patient's Chart, lab work & pertinent test results  Airway Mallampati: III  TM Distance: >3 FB Neck ROM: full    Dental no notable dental hx.    Pulmonary Current Smoker,    Pulmonary exam normal        Cardiovascular  Rhythm:regular Rate:Normal     Neuro/Psych    GI/Hepatic   Endo/Other    Renal/GU      Musculoskeletal   Abdominal   Peds  Hematology   Anesthesia Other Findings   Reproductive/Obstetrics                             Anesthesia Physical Anesthesia Plan  ASA: II  Anesthesia Plan: General ETT   Post-op Pain Management:    Induction:   Airway Management Planned:   Additional Equipment:   Intra-op Plan:   Post-operative Plan:   Informed Consent: I have reviewed the patients History and Physical, chart, labs and discussed the procedure including the risks, benefits and alternatives for the proposed anesthesia with the patient or authorized representative who has indicated his/her understanding and acceptance.     Plan Discussed with: CRNA  Anesthesia Plan Comments:         Anesthesia Quick Evaluation

## 2015-12-02 NOTE — Op Note (Signed)
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  The patient was taken to the operating room and with the patient in a comfortable supine position,  general orotracheal anesthesia was induced without difficulty.  A proper time-out was performed.  The Stryker image guidance system was set up and calibrated in the normal fashion with an acceptable error of 0.39mm.       The patient next received preoperative Afrin spray for topical decongestion and vasoconstriction and 1% Xylocaine with 1:100,000 epinephrine, 5 cc's, was infiltrated into the inferior turbinates, septum, and anterior middle turbinates bilaterally.  Several minutes were allowed for this to take effect.  Cottoniod pledgets soaked in Afrin were placed into both nasal cavities and left while the patient was prepped and draped in the standard fashion.  The materials were removed from the nose and observed to be intact and correct in number.  The nose was next inspected with a zero degree endoscope and the middle turbinates were medialized and afrin soaked pledgets were placed lateral to the turbinates for approximately one minute.  At this time,  attention was directed to the patient's left front sinus.  The uncinate process was infractured with a maxillary ostia seeker.  The Acclarent balloon sinuplasty device was next placed behind the uncinate process and the guide wire was threaded into the frontal sinus with proper transillumination.  The sinus tract was then dilated 3X for a patent frontal sinus.  At this time a currette and Giraffe probe was used to remove the fractured ager nasi cells for a widely patient frontal sinus outflow tract.  This was repeated in an identical fashion on the patient's right side for a widely patent frontal sinus outflow tract bilaterally except that the patient had had a prior maxillary antrostomy so no uncinate was removed.  This was evaluated with 0 degree endoscope and fragements of residual ager nasi cells were removed with 45 degree up-biting forceps.  At this time attention was directed to the patient's maxillary sinuses.  On the left, a ball tipped probe was placed through the natural ostia and this was used to create a larger opening.  Using a Diego microdebrider, the maxillary antrostomy was enlarged for a widely patent maxillary antrostomy.  Hemostasis was performed with topical Afrin soaked pledgets.  Visualization with a zero degree endoscope was used to examine the bilateral maxillary antrostomies which were noted to be widely patent and in continuity with the natural os bilaterally.  Next attention was directed to the patient's ethmoid sinuses.  The left ethmoid bulla was entered with a straight image guidance suction.  The ethmoid cells were opened from a medial and inferior position superior and laterally until the vertical  lamella was encountered.  This was opened and the posterior ethmoid was opened in a similar fashion.  All fragments of bone and mucosa were removed with straight biting forceps.  The skull base was identified and trauma was avoided.  Image guidance was used throughout to ensure all  diseased air cells were opened.  This was repeated on the patient's right side in a similar fashion opening remaining anterior ethmoid air cells as well as the vertical lamella on the right.  This demonstrated significant osteitic bone.   At this time with all sinuses opened, the patient's nasal cavity was examinated and copiously irrigated with sterile saline.  Meticulous hemostasis was continued and all sinuses were examined and noted to be widely patent.  Stamberger sinufoam was placed into the patient's opened ethmoid cavities bilaterally.  Xerogel was placed lateral to the middle turbinate on the left and within the ethmoid cavity on the right as there is little middle turbinate remaining from previous surgery.  At this time attention was directed to the gel film myringoplasty.  The patient's right ear was examined under binocular otomicroscopy.  This demonstrated a partially medialized butterfly tube which was gently removed with alligator forceps.  The perforation was rimmed with a Constance Holster pick and the rim of tissue removed with cup forceps.  A fashioned piece of Gel-film was next placed over the perforation site.  This was repeated on the patient's left side as well.  Care of the patient at this time was transferred to anesthesia and was extubated and taken to PACU in good condition.  Dispo:   PACU to home  Plan: Ice, elevation, narcotic analgesia and prophylactic antibiotics.  We will reevaluate the patient in the office in 10 days.  Return to work in 10 days, strenuous activities in two weeks.   [name] 12/02/2015 12:50 PM

## 2015-12-02 NOTE — Transfer of Care (Signed)
Immediate Anesthesia Transfer of Care Note  Patient: Brooke Rush  Procedure(s) Performed: Procedure(s) with comments: IMAGE GUIDED SINUS SURGERY (N/A) - GAVE DISK TO CECE 6/22 DEE GEL FOAM PATCH STRYKER FRONTAL SINUS EXPLORATION (Bilateral) LEFT TOTAL ETHMOIDECTOMY  RIGHT  POST ETHMOIDECTOMY (Bilateral) MAXILLARY ANTROSTOMY (Left) EXAM UNDER ANESTHESIA (Bilateral) REMOVAL OF EAR TUBE (Bilateral)  Patient Location: PACU  Anesthesia Type: General ETT  Level of Consciousness: awake, alert  and patient cooperative  Airway and Oxygen Therapy: Patient Spontanous Breathing and Patient connected to supplemental oxygen  Post-op Assessment: Post-op Vital signs reviewed, Patient's Cardiovascular Status Stable, Respiratory Function Stable, Patent Airway and No signs of Nausea or vomiting  Post-op Vital Signs: Reviewed and stable  Complications: No apparent anesthesia complications

## 2015-12-02 NOTE — Anesthesia Procedure Notes (Signed)
Procedure Name: Intubation Date/Time: 12/02/2015 10:46 AM Performed by: Mayme Genta Pre-anesthesia Checklist: Patient identified, Emergency Drugs available, Suction available, Patient being monitored and Timeout performed Patient Re-evaluated:Patient Re-evaluated prior to inductionOxygen Delivery Method: Circle system utilized Preoxygenation: Pre-oxygenation with 100% oxygen Intubation Type: IV induction Ventilation: Mask ventilation without difficulty Laryngoscope Size: Miller and 2 Grade View: Grade I Tube type: Oral Rae Tube size: 7.0 mm Number of attempts: 1 Placement Confirmation: ETT inserted through vocal cords under direct vision,  positive ETCO2 and breath sounds checked- equal and bilateral Tube secured with: Tape Dental Injury: Teeth and Oropharynx as per pre-operative assessment

## 2015-12-02 NOTE — Discharge Instructions (Signed)
[facility] °[facility] °ENDOSCOPIC SINUS SURGERY °[facility] ° °What is Functional Endoscopic Sinus Surgery? ° The Surgery involves making the natural openings of the sinuses larger by removing the bony partitions that separate the sinuses from the nasal cavity.  The natural sinus lining is preserved as much as possible to allow the sinuses to resume normal function after the surgery.  In some patients nasal polyps (excessively swollen lining of the sinuses) may be removed to relieve obstruction of the sinus openings.  The surgery is performed through the nose using lighted scopes, which eliminates the need for incisions on the face.  A septoplasty is a different procedure which is sometimes performed with sinus surgery.  It involves straightening the boy partition that separates the two sides of your nose.  A crooked or deviated septum may need repair if is obstructing the sinuses or nasal airflow.  Turbinate reduction is also often performed during sinus surgery.  The turbinates are bony proturberances from the side walls of the nose which swell and can obstruct the nose in patients with sinus and allergy problems.  Their size can be surgically reduced to help relieve nasal obstruction. ° °What Can Sinus Surgery Do For Me? ° Sinus surgery can reduce the frequency of sinus infections requiring antibiotic treatment.  This can provide improvement in nasal congestion, post-nasal drainage, facial pressure and nasal obstruction.  Surgery will NOT prevent you from ever having an infection again, so it usually only for patients who get infections 4 or more times yearly requiring antibiotics, or for infections that do not clear with antibiotics.  It will not cure nasal allergies, so patients with allergies may still require medication to treat their allergies after surgery. Surgery may improve headaches related to sinusitis, however, some people will continue to  require medication to control sinus headaches related to allergies.  Surgery will do nothing for other forms of headache (migraine, tension or cluster). ° °What Are the Risks of Endoscopic Sinus Surgery? ° Current techniques allow surgery to be performed safely with little risk, however, there are rare complications that patients should be aware of.  Because the sinuses are located around the eyes, there is risk of eye injury, including blindness, though again, this would be quite rare. This is usually a result of bleeding behind the eye during surgery, which puts the vision oat risk, though there are treatments to protect the vision and prevent permanent disrupted by surgery causing a leak of the spinal fluid that surrounds the brain.  More serious complications would include bleeding inside the brain cavity or damage to the brain.  Again, all of these complications are uncommon, and spinal fluid leaks can be safely managed surgically if they occur.  The most common complication of sinus surgery is bleeding from the nose, which may require packing or cauterization of the nose.  Continued sinus have polyps may experience recurrence of the polyps requiring revision surgery.  Alterations of sense of smell or injury to the tear ducts are also rare complications.  ° °What is the Surgery Like, and what is the Recovery? ° The Surgery usually takes a couple of hours to perform, and is usually performed under a general anesthetic (completely asleep).  Patients are usually discharged home after a couple of hours.  Sometimes during surgery it is necessary to pack the nose to control bleeding, and the packing is left in place for 24 - 48 hours, and removed by your surgeon.    If a septoplasty was performed during the procedure, there is often a splint placed which must be removed after 5-7 days.   °Discomfort: Pain is usually mild to moderate, and can be controlled by prescription pain medication or acetaminophen (Tylenol).   Aspirin, Ibuprofen (Advil, Motrin), or Naprosyn (Aleve) should be avoided, as they can cause increased bleeding.  Most patients feel sinus pressure like they have a bad head cold for several days.  Sleeping with your head elevated can help reduce swelling and facial pressure, as can ice packs over the face.  A humidifier may be helpful to keep the mucous and blood from drying in the nose.  ° °Diet: There are no specific diet restrictions, however, you should generally start with clear liquids and a light diet of bland foods because the anesthetic can cause some nausea.  Advance your diet depending on how your stomach feels.  Taking your pain medication with food will often help reduce stomach upset which pain medications can cause. ° °Nasal Saline Irrigation: It is important to remove blood clots and dried mucous from the nose as it is healing.  This is done by having you irrigate the nose at least 3 - 4 times daily with a salt water solution.  We recommend using NeilMed Sinus Rinse (available at the drug store).  Fill the squeeze bottle with the solution, bend over a sink, and insert the tip of the squeeze bottle into the nose ½ of an inch.  Point the tip of the squeeze bottle towards the inside corner of the eye on the same side your irrigating.  Squeeze the bottle and gently irrigate the nose.  If you bend forward as you do this, most of the fluid will flow back out of the nose, instead of down your throat.   The solution should be warm, near body temperature, when you irrigate.   Each time you irrigate, you should use a full squeeze bottle.  ° °Note that if you are instructed to use Nasal Steroid Sprays at any time after your surgery, irrigate with saline BEFORE using the steroid spray, so you do not wash it all out of the nose. °Another product, Nasal Saline Gel (such as AYR Nasal Saline Gel) can be applied in each nostril 3 - 4 times daily to moisture the nose and reduce scabbing or crusting. ° °Bleeding:   Bloody drainage from the nose can be expected for several days, and patients are instructed to irrigate their nose frequently with salt water to help remove mucous and blood clots.  The drainage may be dark red or brown, though some fresh blood may be seen intermittently, especially after irrigation.  Do not blow you nose, as bleeding may occur. If you must sneeze, keep your mouth open to allow air to escape through your mouth. ° °If heavy bleeding occurs: Irrigate the nose with saline to rinse out clots, then spray the nose 3 - 4 times with Afrin Nasal Decongestant Spray.  The spray will constrict the blood vessels to slow bleeding.  Pinch the lower half of your nose shut to apply pressure, and lay down with your head elevated.  Ice packs over the nose may help as well. If bleeding persists despite these measures, you should notify your doctor.  Do not use the Afrin routinely to control nasal congestion after surgery, as it can result in worsening congestion and may affect healing.  ° ° ° °Activity: Return to work varies among patients. Most patients will be   out of work at least 5 - 7 days to recover.  Patient may return to work after they are off of narcotic pain medication, and feeling well enough to perform the functions of their job.  Patients must avoid heavy lifting (over 10 pounds) or strenuous physical for 2 weeks after surgery, so your employer may need to assign you to light duty, or keep you out of work longer if light duty is not possible.  NOTE: you should not drive, operate dangerous machinery, do any mentally demanding tasks or make any important legal or financial decisions while on narcotic pain medication and recovering from the general anesthetic.  °  °Call Your Doctor Immediately if You Have Any of the Following: °1. Bleeding that you cannot control with the above measures °2. Loss of vision, double vision, bulging of the eye or black eyes. °3. Fever over 101 degrees °4. Neck stiffness with  severe headache, fever, nausea and change in mental state. °You are always encourage to call anytime with concerns, however, please call with requests for pain medication refills during office hours. ° °Office Endoscopy: During follow-up visits your doctor will remove any packing or splints that may have been placed and evaluate and clean your sinuses endoscopically.  Topical anesthetic will be used to make this as comfortable as possible, though you may want to take your pain medication prior to the visit.  How often this will need to be done varies from patient to patient.  After complete recovery from the surgery, you may need follow-up endoscopy from time to time, particularly if there is concern of recurrent infection or nasal polyps. ° °General Anesthesia, Adult, Care After °Refer to this sheet in the next few weeks. These instructions provide you with information on caring for yourself after your procedure. Your health care provider may also give you more specific instructions. Your treatment has been planned according to current medical practices, but problems sometimes occur. Call your health care provider if you have any problems or questions after your procedure. °WHAT TO EXPECT AFTER THE PROCEDURE °After the procedure, it is typical to experience: °· Sleepiness. °· Nausea and vomiting. °HOME CARE INSTRUCTIONS °· For the first 24 hours after general anesthesia: °¨ Have a responsible person with you. °¨ Do not drive a car. If you are alone, do not take public transportation. °¨ Do not drink alcohol. °¨ Do not take medicine that has not been prescribed by your health care provider. °¨ Do not sign important papers or make important decisions. °¨ You may resume a normal diet and activities as directed by your health care provider. °· Change bandages (dressings) as directed. °· If you have questions or problems that seem related to general anesthesia, call the hospital and ask for the anesthetist or  anesthesiologist on call. °SEEK MEDICAL CARE IF: °· You have nausea and vomiting that continue the day after anesthesia. °· You develop a rash. °SEEK IMMEDIATE MEDICAL CARE IF:  °· You have difficulty breathing. °· You have chest pain. °· You have any allergic problems. °  °This information is not intended to replace advice given to you by your health care provider. Make sure you discuss any questions you have with your health care provider. °  °Document Released: 08/29/2000 Document Revised: 06/13/2014 Document Reviewed: 09/21/2011 °Elsevier Interactive Patient Education ©2016 [employer] Inc. ° °

## 2015-12-03 ENCOUNTER — Encounter: Payer: Self-pay | Admitting: Otolaryngology

## 2015-12-04 LAB — SURGICAL PATHOLOGY

## 2016-04-05 ENCOUNTER — Encounter: Payer: Self-pay | Admitting: Family

## 2016-04-05 ENCOUNTER — Telehealth: Payer: Self-pay | Admitting: Nurse Practitioner

## 2016-04-05 ENCOUNTER — Ambulatory Visit: Payer: 59 | Admitting: Family

## 2016-04-05 NOTE — Telephone Encounter (Signed)
FYI

## 2016-04-05 NOTE — Telephone Encounter (Signed)
Pt called and cancelled her 3pm appt for today. States that she has the flu.

## 2016-04-05 NOTE — Progress Notes (Deleted)
Subjective:    Patient ID: Brooke Rush, female    DOB: 1976/04/02, 39 y.o.   MRN: [id number]  CC: Brooke Rush is a 40 y.o. female who presents today for follow up.   HPI: Patient here for follow-up and for medication consult.  Headache-on Topamax, Zomig; Smoker   Anxiety- Ativan 1mg .     HISTORY:  Past Medical History:  Diagnosis Date  . Arthritis    in her neck  . Chronic bronchitis ([place])   . Constipation    2/2 pain medication  . ETD (eustachian tube dysfunction)   . Fibromyalgia    Diagnosed by Dr. Derrel Nip  . GERD (gastroesophageal reflux disease)   . Kidney stones   . Kidney stones    Hx of  . Migraines   . Otorrhea   . Plantar fasciitis    bilateral   Past Surgical History:  Procedure Laterality Date  . CARPAL TUNNEL RELEASE Bilateral 1998  . CHOLECYSTECTOMY  2011  . ETHMOIDECTOMY Bilateral 12/02/2015   Procedure: LEFT TOTAL ETHMOIDECTOMY  RIGHT  REVISION TOTAL ETHMOIDECTOMY;  Surgeon: Carloyn Manner, MD;  Location: [place];  Service: ENT;  Laterality: Bilateral;  . FRONTAL SINUS EXPLORATION Bilateral 12/02/2015   Procedure: FRONTAL SINUSOTOMY;  Surgeon: Carloyn Manner, MD;  Location: [place];  Service: ENT;  Laterality: Bilateral;  . HEMORROIDECTOMY     Dr. Tamala Julian  . IMAGE GUIDED SINUS SURGERY N/A 12/02/2015   Procedure: IMAGE GUIDED SINUS SURGERY;  Surgeon: Carloyn Manner, MD;  Location: [place];  Service: ENT;  Laterality: N/A;  GAVE DISK TO [facility] 6/22 DEE GEL [place]  . MAXILLARY ANTROSTOMY Left 12/02/2015   Procedure: MAXILLARY ANTROSTOMY;  Surgeon: Carloyn Manner, MD;  Location: [place];  Service: ENT;  Laterality: Left;  . NASAL SEPTUM SURGERY  2011  . NASAL SINUS SURGERY  2011   Dr. Pryor Ochoa  . REMOVAL OF EAR TUBE Bilateral 12/02/2015   Procedure: Bilateral Tube removal with Gel Film Myringoplasty;  Surgeon: Carloyn Manner, MD;  Location: [place];  Service:  ENT;  Laterality: Bilateral;  . TONSILLECTOMY AND ADENOIDECTOMY  2013   Family History  Problem Relation Age of Onset  . Arthritis Mother     Degenerative Disc Dz  . Obesity Mother   . Alcohol abuse Father   . Hyperlipidemia Father   . Heart disease Father     CAD - CABG age 21  . Kidney disease Maternal Grandmother   . Heart disease Maternal Grandmother   . Heart disease Maternal Grandfather   . Hyperlipidemia Maternal Grandfather   . Hypertension Maternal Grandfather     Allergies: Review of patient's allergies indicates no known allergies. Current Outpatient Prescriptions on File Prior to Visit  Medication Sig Dispense Refill  . acetaminophen (TYLENOL) 500 MG tablet Take by mouth.    Marland Kitchen amoxicillin-clavulanate (AUGMENTIN) 875-125 MG tablet Take 1 tablet by mouth 2 (two) times daily. 20 tablet 0  . b complex vitamins tablet Take 1 tablet by mouth daily. Reported on 11/30/2015    . cetirizine (ZYRTEC) 10 MG tablet Take 10 mg by mouth daily.    . cholecalciferol (VITAMIN D) 1000 UNITS tablet Take 2,000 Units by mouth daily. Reported on 11/30/2015    . cyclobenzaprine (FLEXERIL) 5 MG tablet Take 1 tablet (5 mg total) by mouth 3 (three) times daily as needed for muscle spasms. 90 tablet 0  . esomeprazole (NEXIUM) 40 MG capsule Take 1 capsule (40  mg total) by mouth 2 (two) times daily. 180 capsule 1  . fluticasone (FLONASE) 50 MCG/ACT nasal spray Place 2 sprays into both nostrils daily. 16 g 0  . LORazepam (ATIVAN) 1 MG tablet Take 1 tablet (1 mg total) by mouth at bedtime as needed for anxiety. 30 tablet 0  . oxyCODONE (ROXICODONE) 5 MG/5ML solution Take 10 mLs (10 mg total) by mouth every 4 (four) hours as needed for severe pain. 400 mL 0  . promethazine (PHENERGAN) 12.5 MG tablet Take 1 tablet (12.5 mg total) by mouth every 6 (six) hours as needed for nausea or vomiting. 30 tablet 0  . pseudoephedrine (SUDAFED) 60 MG tablet Take 60 mg by mouth every 4 (four) hours as needed for  congestion.    . topiramate (TOPAMAX) 100 MG tablet Take 1 tablet (100 mg total) by mouth 2 (two) times daily. (Patient not taking: Reported on 11/30/2015) 60 tablet 1  . triamcinolone ointment (KENALOG) 0.5 % Apply 1 application topically 2 (two) times daily. 30 g 0  . valACYclovir (VALTREX) 1000 MG tablet TAKE 1 TABLET TWICE A DAY 90 tablet 4  . zolmitriptan (ZOMIG) 5 MG tablet Take 1 tablet (5 mg total) by mouth as needed for migraine. 10 tablet 1   No current facility-administered medications on file prior to visit.     Social History  Substance Use Topics  . Smoking status: Current Every Day Smoker    Packs/day: 1.00    Years: 21.00  . Smokeless tobacco: Not on file     Comment: Vaping  . Alcohol use 0.0 oz/week     Comment: 1-2 drinks twice a week    Review of Systems    Objective:    There were no vitals taken for this visit. BP Readings from Last 3 Encounters:  12/02/15 130/89  06/22/15 98/82  05/21/15 97/69   Wt Readings from Last 3 Encounters:  12/02/15 207 lb (93.9 kg)  06/22/15 198 lb 12.8 oz (90.2 kg)  05/21/15 187 lb (84.8 kg)    Physical Exam     Assessment & Plan:   Problem List Items Addressed This Visit    None    Visit Diagnoses   None.      I am having Ms. Elray Buba maintain her b complex vitamins, esomeprazole, cholecalciferol, cetirizine, triamcinolone ointment, valACYclovir, acetaminophen, cyclobenzaprine, LORazepam, fluticasone, zolmitriptan, topiramate, pseudoephedrine, oxyCODONE, promethazine, and amoxicillin-clavulanate.   No orders of the defined types were placed in this encounter.   Return precautions given.   Risks, benefits, and alternatives of the medications and treatment plan prescribed today were discussed, and patient expressed understanding.   Education regarding symptom management and diagnosis given to patient on AVS.  Continue to follow with Brooke Battiest, NP for routine health maintenance.   [place] and I agreed with plan.   Brooke Paris, FNP

## 2016-04-06 NOTE — Telephone Encounter (Signed)
Left message for patient to return call back.  Patient will need an est care appointment with new PCP.

## 2016-04-06 NOTE — Telephone Encounter (Signed)
If you would call patient later today 11/1 and see how she is doing.  I would appreciate it. She also needs to make an appt to est care with one of the providers here

## 2016-04-07 NOTE — Telephone Encounter (Signed)
Patient stated that she's much better. Pt will establish with [place]

## 2016-06-03 ENCOUNTER — Other Ambulatory Visit: Payer: Self-pay | Admitting: Nurse Practitioner

## 2016-06-03 ENCOUNTER — Encounter: Payer: Self-pay | Admitting: Family Medicine

## 2016-06-03 ENCOUNTER — Ambulatory Visit (INDEPENDENT_AMBULATORY_CARE_PROVIDER_SITE_OTHER): Payer: BLUE CROSS/BLUE SHIELD | Admitting: Family Medicine

## 2016-06-03 DIAGNOSIS — G43709 Chronic migraine without aura, not intractable, without status migrainosus: Secondary | ICD-10-CM

## 2016-06-03 DIAGNOSIS — F419 Anxiety disorder, unspecified: Secondary | ICD-10-CM | POA: Insufficient documentation

## 2016-06-03 DIAGNOSIS — G540 Brachial plexus disorders: Secondary | ICD-10-CM

## 2016-06-03 DIAGNOSIS — Z720 Tobacco use: Secondary | ICD-10-CM

## 2016-06-03 DIAGNOSIS — G43909 Migraine, unspecified, not intractable, without status migrainosus: Secondary | ICD-10-CM | POA: Insufficient documentation

## 2016-06-03 DIAGNOSIS — J42 Unspecified chronic bronchitis: Secondary | ICD-10-CM | POA: Insufficient documentation

## 2016-06-03 MED ORDER — VARENICLINE TARTRATE 1 MG PO TABS: 1.0000 mg | ORAL_TABLET | Freq: Two times a day (BID) | ORAL | 0 refills | Status: DC

## 2016-06-03 MED ORDER — BUTALBITAL-APAP-CAFF-COD 50-325-40-30 MG PO CAPS: 1.0000 | ORAL_CAPSULE | Freq: Four times a day (QID) | ORAL | 1 refills | Status: DC | PRN

## 2016-06-03 MED ORDER — HYDROCODONE-ACETAMINOPHEN 5-325 MG PO TABS: 1.0000 | ORAL_TABLET | ORAL | 0 refills | Status: DC | PRN

## 2016-06-03 MED ORDER — LORAZEPAM 1 MG PO TABS: 1.0000 mg | ORAL_TABLET | Freq: Every evening | ORAL | 0 refills | Status: DC | PRN

## 2016-06-03 NOTE — Progress Notes (Signed)
Subjective:  Patient ID: Brooke Rush, female    DOB: 12/12/1975  Age: 40 y.o. MRN: [id number]  CC: Follow up, medication refills  HPI:  40 year old female with history of thoracic outlet syndrome, fibromyalgia, tobacco abuse, migraine, anxiety presents for follow up.  Thoracic outlet syndrome, fibromyalgia  Has been followed by our nurse practitioner previously.  Is on chronic narcotics. She takes Vicodin 5/325 as needed for pain.  She states that she seen numerous specialists and has had several treatments regarding her thoracic outlet syndrome without improvement.  History other medications in the past without improvement.  Requesting refill today.  Anxiety  Stable on PRN Ativan.  Needs refill.  Migraine  Stable on PRN Fioricet.  Needs refill.  Tobacco abuse  Has recent started Chantix.  Was only given a month's supply.  Is in need of the continuing dosing (for weeks 5-12).  Social Hx   Social History   Social History  . Marital status: Married    Spouse name: N/A  . Number of children: 3  . Years of education: 14   Occupational History  . CNA for [employer]   Social History Main Topics  . Smoking status: Current Every Day Smoker    Packs/day: 1.00    Years: 21.00  . Smokeless tobacco: Never Used     Comment: Vaping  . Alcohol use 0.0 oz/week     Comment: 1-2 drinks twice a week  . Drug use: No  . Sexual activity: Not Currently    Partners: Male     Comment: Boyfriend   Other Topics Concern  . None   Social History Narrative   [place] grew up in [place], [place]. She is living at home with her boyfriend and one of her children. She also has a 4 y/o daughter who lives with her aunt. [place]oke Rush also had a baby girl that passed away in [id number] (30 days old). 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 [place]  enjoys crafting on her spare time.    Review of Systems  Musculoskeletal: Positive for arthralgias and myalgias.  Neurological: Positive for headaches.  Psychiatric/Behavioral: The patient is nervous/anxious.    Objective:  BP 130/77   Pulse (!) 102   Temp 98.1 F (36.7 C) (Oral)   Resp 14   Wt 206 lb 3.2 oz (93.5 kg)   SpO2 98%   BMI 35.39 kg/m   BP/Weight 06/03/2016 12/02/2015 [id number]  Systolic BP [id number] 98  Diastolic BP 77 89 82  Wt. (Lbs) 206.2 207 198.8  BMI 35.39 35.51 34.11   Physical Exam  Constitutional: She is oriented to person, place, and time. She appears well-developed. No distress.  Cardiovascular: Normal rate and regular rhythm.   Pulmonary/Chest: Effort normal and breath sounds normal.  Neurological: She is alert and oriented to person, place, and time.  Psychiatric: She has a normal mood and affect.  Vitals reviewed.  Lab Results  Component Value Date   WBC 8.9 05/21/2015   HGB 14.2 05/21/2015   HCT 43.5 05/21/2015   PLT 268 05/21/2015   GLUCOSE 107 (H) 05/21/2015   CHOL 173 07/18/2014   TRIG 69.0 07/18/2014   HDL 48.60 07/18/2014   LDLCALC 111 (H) 07/18/2014   ALT 17 05/13/2015   AST 15 05/13/2015   NA 140  05/21/2015   K 3.9 05/21/2015   CL 107 05/21/2015   CREATININE 0.59 05/21/2015   BUN 9 05/21/2015   CO2 26 05/21/2015   TSH 1.30 07/18/2014   HGBA1C 5.6 09/18/2014    Assessment & Plan:   Problem List Items Addressed This Visit    Tobacco abuse    Chantix Rx sent today for continuing dosing.      Thoracic outlet syndrome    Stable. Vicodin was refilled today (#30). I informed her that this would be the last time that this is done. I do not prescribe chronic narcotics and she will need to see pain management for further refills. She understands.      Relevant Medications   LORazepam (ATIVAN) 1 MG tablet   varenicline (CHANTIX CONTINUING MONTH PAK) 1 MG tablet   Migraine    Stable. Fioricet refilled.       Relevant  Medications   HYDROcodone-acetaminophen (NORCO/VICODIN) 5-325 MG tablet   butalbital-acetaminophen-caffeine (FIORICET WITH CODEINE) 50-325-40-30 MG capsule   Anxiety    Stable on Ativan. Refilled today.       Relevant Medications   LORazepam (ATIVAN) 1 MG tablet      Meds ordered this encounter  Medications  . DISCONTD: HYDROcodone-acetaminophen (NORCO/VICODIN) 5-325 MG tablet    Sig: Take by mouth.  . DISCONTD: butalbital-acetaminophen-caffeine (FIORICET WITH CODEINE) 50-325-40-30 MG capsule    Sig: Take 1 capsule by mouth every 4 (four) hours as needed for headache.  Marland Kitchen HYDROcodone-acetaminophen (NORCO/VICODIN) 5-325 MG tablet    Sig: Take 1 tablet by mouth every 4 (four) hours as needed for moderate pain.    Dispense:  30 tablet    Refill:  0  . LORazepam (ATIVAN) 1 MG tablet    Sig: Take 1 tablet (1 mg total) by mouth at bedtime as needed for anxiety.    Dispense:  90 tablet    Refill:  0  . butalbital-acetaminophen-caffeine (FIORICET WITH CODEINE) 50-325-40-30 MG capsule    Sig: Take 1 capsule by mouth every 6 (six) hours as needed for headache.    Dispense:  30 capsule    Refill:  1  . varenicline (CHANTIX CONTINUING MONTH PAK) 1 MG tablet    Sig: Take 1 tablet (1 mg total) by mouth 2 (two) times daily.    Dispense:  60 tablet    Refill:  0    Follow-up: Losing insurance. Unsure of when she will be able to follow up.  [place]

## 2016-06-03 NOTE — Progress Notes (Signed)
Pre visit review using our clinic review tool, if applicable. No additional management support is needed unless otherwise documented below in the visit note. 

## 2016-06-03 NOTE — Assessment & Plan Note (Signed)
Stable. Vicodin was refilled today (#30). I informed her that this would be the last time that this is done. I do not prescribe chronic narcotics and she will need to see pain management for further refills. She understands.

## 2016-06-03 NOTE — Assessment & Plan Note (Signed)
Stable on Ativan. Refilled today.

## 2016-06-03 NOTE — Assessment & Plan Note (Signed)
Chantix Rx sent today for continuing dosing.

## 2016-06-03 NOTE — Patient Instructions (Signed)
I have refilled your medications.  I do not prescribe chronic narcotics. I have refilled today but for future refills you will have to see pain management.  Take care  Dr. Lacinda Axon

## 2016-06-03 NOTE — Assessment & Plan Note (Signed)
Stable. Fioricet refilled.

## 2016-06-22 ENCOUNTER — Ambulatory Visit: Payer: 59 | Admitting: Family

## 2016-07-05 ENCOUNTER — Telehealth: Payer: Self-pay

## 2016-07-05 ENCOUNTER — Telehealth: Payer: Self-pay | Admitting: Internal Medicine

## 2016-07-05 NOTE — Telephone Encounter (Signed)
Called and LVM for patient to return phone call to discuss questions she has. Gave call back number.

## 2016-07-05 NOTE — Telephone Encounter (Signed)
Called and LVM for patient to call back with questions.

## 2016-07-05 NOTE — Telephone Encounter (Signed)
Pt called in to speak to Dr. Arman Filter nurse, she requests call back.

## 2016-09-12 ENCOUNTER — Other Ambulatory Visit: Payer: Self-pay | Admitting: Family Medicine

## 2016-09-12 ENCOUNTER — Ambulatory Visit (INDEPENDENT_AMBULATORY_CARE_PROVIDER_SITE_OTHER): Payer: BC Managed Care – PPO

## 2016-09-12 ENCOUNTER — Ambulatory Visit (INDEPENDENT_AMBULATORY_CARE_PROVIDER_SITE_OTHER): Payer: BC Managed Care – PPO | Admitting: Family Medicine

## 2016-09-12 ENCOUNTER — Encounter: Payer: Self-pay | Admitting: Family Medicine

## 2016-09-12 VITALS — BP 126/84 | HR 105 | Temp 98.3°F | Wt 209.1 lb

## 2016-09-12 DIAGNOSIS — M545 Low back pain, unspecified: Secondary | ICD-10-CM

## 2016-09-12 DIAGNOSIS — G8929 Other chronic pain: Secondary | ICD-10-CM

## 2016-09-12 DIAGNOSIS — M25552 Pain in left hip: Secondary | ICD-10-CM

## 2016-09-12 DIAGNOSIS — M5441 Lumbago with sciatica, right side: Secondary | ICD-10-CM

## 2016-09-12 DIAGNOSIS — M25559 Pain in unspecified hip: Secondary | ICD-10-CM | POA: Insufficient documentation

## 2016-09-12 DIAGNOSIS — M5442 Lumbago with sciatica, left side: Secondary | ICD-10-CM

## 2016-09-12 DIAGNOSIS — M25551 Pain in right hip: Secondary | ICD-10-CM

## 2016-09-12 MED ORDER — PREDNISONE 10 MG PO TABS: ORAL_TABLET | ORAL | 0 refills | Status: DC

## 2016-09-12 NOTE — Progress Notes (Signed)
Pre visit review using our clinic review tool, if applicable. No additional management support is needed unless otherwise documented below in the visit note. 

## 2016-09-12 NOTE — Assessment & Plan Note (Signed)
New problem. Xray today. Sending to PT. Prednisone burst.

## 2016-09-12 NOTE — Assessment & Plan Note (Signed)
New problem. Xray today. Treating with prednisone. Sending to PT.

## 2016-09-12 NOTE — Patient Instructions (Signed)
Prednisone as prescribed.  We will call regarding xray results and PT referral.  Take care  Dr. Lacinda Axon

## 2016-09-12 NOTE — Progress Notes (Signed)
Subjective:  Patient ID: Brooke Rush, female    DOB: 11-02-1975  Age: 41 y.o. MRN: [id number]  CC: Back, Hip pain  HPI:  41 year old female with a past history of fibromyalgia presents with the above complaints.  Patient reports that she hip pain and low back pain for the past 6 months. Has been worse over the past 2 months. She reports lateral hip pain around the trochanters, bilateral groin pain, and pain in the low back. Worse with certain activities. Severe. She reports associated radiation down the legs. She has used ice, massage, Aleve, and Celebrex with no relief. She has previously been on chronic narcotics. No other associated symptoms. No other complaints at this time.   Social Hx   Social History   Social History  . Marital status: Married    Spouse name: N/A  . Number of children: 3  . Years of education: 14   Occupational History  . CNA for [employer]   Social History Main Topics  . Smoking status: Former Smoker    Packs/day: 1.00    Years: 21.00    Quit date: 07/05/2016  . Smokeless tobacco: Never Used     Comment: Vaping  . Alcohol use 0.0 oz/week     Comment: 1-2 drinks twice a week  . Drug use: No  . Sexual activity: Not Currently    Partners: Male     Comment: Boyfriend   Other Topics Concern  . None   Social History Narrative   Sharnese grew up in [place], [place]. She is living at home with her boyfriend and one of her children. She also has a 45 y/o daughter who lives with her aunt. Maurianna also had a baby girl that passed away in 8538 (59 days old). 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 Faithanne enjoys crafting on her spare time.    Review of Systems  Constitutional: Negative.   Musculoskeletal:       Hip pain, back pain.    Objective:  BP 126/84   Pulse (!) 105   Temp 98.3 F (36.8 C) (Oral)   Wt  209 lb 2 oz (94.9 kg)   SpO2 98%   BMI 35.90 kg/m   BP/Weight 09/12/2016 06/03/2016 4/96/7591  Systolic BP 638 466 599  Diastolic BP 84 77 89  Wt. (Lbs) 209.13 206.2 207  BMI 35.9 35.39 35.51    Physical Exam  Constitutional: She is oriented to person, place, and time. She appears well-developed. No distress.  Pulmonary/Chest: Effort normal.  Musculoskeletal:  Low back - nontender to palpation. Negative straight leg raise.   Hips - greater trochanter tenderness to palpation. Pain with FADIR bilaterally.  Neurological: She is alert and oriented to person, place, and time.  Psychiatric:  Flat affect.  Vitals reviewed.   Lab Results  Component Value Date   WBC 8.9 05/21/2015   HGB 14.2 05/21/2015   HCT 43.5 05/21/2015   PLT 268 05/21/2015   GLUCOSE 107 (H) 05/21/2015   CHOL 173 07/18/2014   TRIG 69.0 07/18/2014   HDL 48.60 07/18/2014   LDLCALC 111 (H) 07/18/2014   ALT 17 05/13/2015   AST 15 05/13/2015   NA 140 05/21/2015   K 3.9 05/21/2015   CL 107 05/21/2015   CREATININE 0.59 05/21/2015   BUN  9 05/21/2015   CO2 26 05/21/2015   TSH 1.30 07/18/2014   HGBA1C 5.6 09/18/2014    Assessment & Plan:   Problem List Items Addressed This Visit    Hip pain    New problem. Xray today. Sending to PT. Prednisone burst.      Chronic bilateral low back pain with bilateral sciatica - Primary    New problem. Xray today. Treating with prednisone. Sending to PT.      Relevant Medications   celecoxib (CELEBREX) 100 MG capsule   predniSONE (DELTASONE) 10 MG tablet   Other Relevant Orders   DG Lumbar Spine Complete      Meds ordered this encounter  Medications  . predniSONE (DELTASONE) 10 MG tablet    Sig: 4 tablets x 3 days, then 3 tablets x 3 days, then 2 tablets x 3 days, then 1 tablet x 3 days.    Dispense:  30 tablet    Refill:  0    Follow-up: PRN  Schneider

## 2016-09-15 ENCOUNTER — Encounter: Payer: Self-pay | Admitting: Family Medicine

## 2016-12-13 ENCOUNTER — Ambulatory Visit: Admission: RE | Admit: 2016-12-13 | Discharge: 2016-12-13 | Attending: Family Medicine | Admitting: Family Medicine

## 2016-12-13 DIAGNOSIS — M797 Fibromyalgia: Principal | ICD-10-CM

## 2016-12-13 DIAGNOSIS — K219 Gastro-esophageal reflux disease without esophagitis: Secondary | ICD-10-CM

## 2016-12-13 MED ORDER — PANTOPRAZOLE 20 MG TABLET,DELAYED RELEASE
ORAL_TABLET | Freq: Every day | ORAL | 3 refills | 0 days | Status: CP
Start: 2016-12-13 — End: 2017-12-13

## 2016-12-13 MED ORDER — VENLAFAXINE ER 75 MG CAPSULE,EXTENDED RELEASE 24 HR
ORAL_CAPSULE | Freq: Every day | ORAL | 2 refills | 0 days | Status: CP
Start: 2016-12-13 — End: 2017-03-14

## 2017-01-17 ENCOUNTER — Ambulatory Visit
Admission: RE | Admit: 2017-01-17 | Discharge: 2017-01-17 | Disposition: A | Discharge status: Home / Self Care | Attending: Rheumatology | Admitting: Rheumatology

## 2017-01-17 ENCOUNTER — Ambulatory Visit: Admission: RE | Admit: 2017-01-17 | Discharge: 2017-01-17 | Disposition: A | Discharge status: Home / Self Care

## 2017-01-17 DIAGNOSIS — M2550 Pain in unspecified joint: Principal | ICD-10-CM

## 2017-01-17 DIAGNOSIS — M160 Bilateral primary osteoarthritis of hip: Principal | ICD-10-CM

## 2017-01-30 ENCOUNTER — Ambulatory Visit: Admission: RE | Admit: 2017-01-30 | Discharge: 2017-01-30 | Attending: Internal Medicine

## 2017-01-30 DIAGNOSIS — G5701 Lesion of sciatic nerve, right lower limb: Secondary | ICD-10-CM

## 2017-01-30 DIAGNOSIS — M7061 Trochanteric bursitis, right hip: Principal | ICD-10-CM

## 2017-02-03 ENCOUNTER — Ambulatory Visit
Admission: RE | Admit: 2017-02-03 | Discharge: 2017-02-03 | Disposition: A | Discharge status: Home / Self Care | Attending: Physical Medicine & Rehabilitation

## 2017-02-03 DIAGNOSIS — M5416 Radiculopathy, lumbar region: Principal | ICD-10-CM

## 2017-02-03 IMAGING — CR DG CHEST 2V
2 series · 2 of 2 positions shown · non-contrast
Comparison: None.

CLINICAL DATA: Chest pain, shortness of breath.

EXAM:
CHEST  2 VIEW

[chest pa]
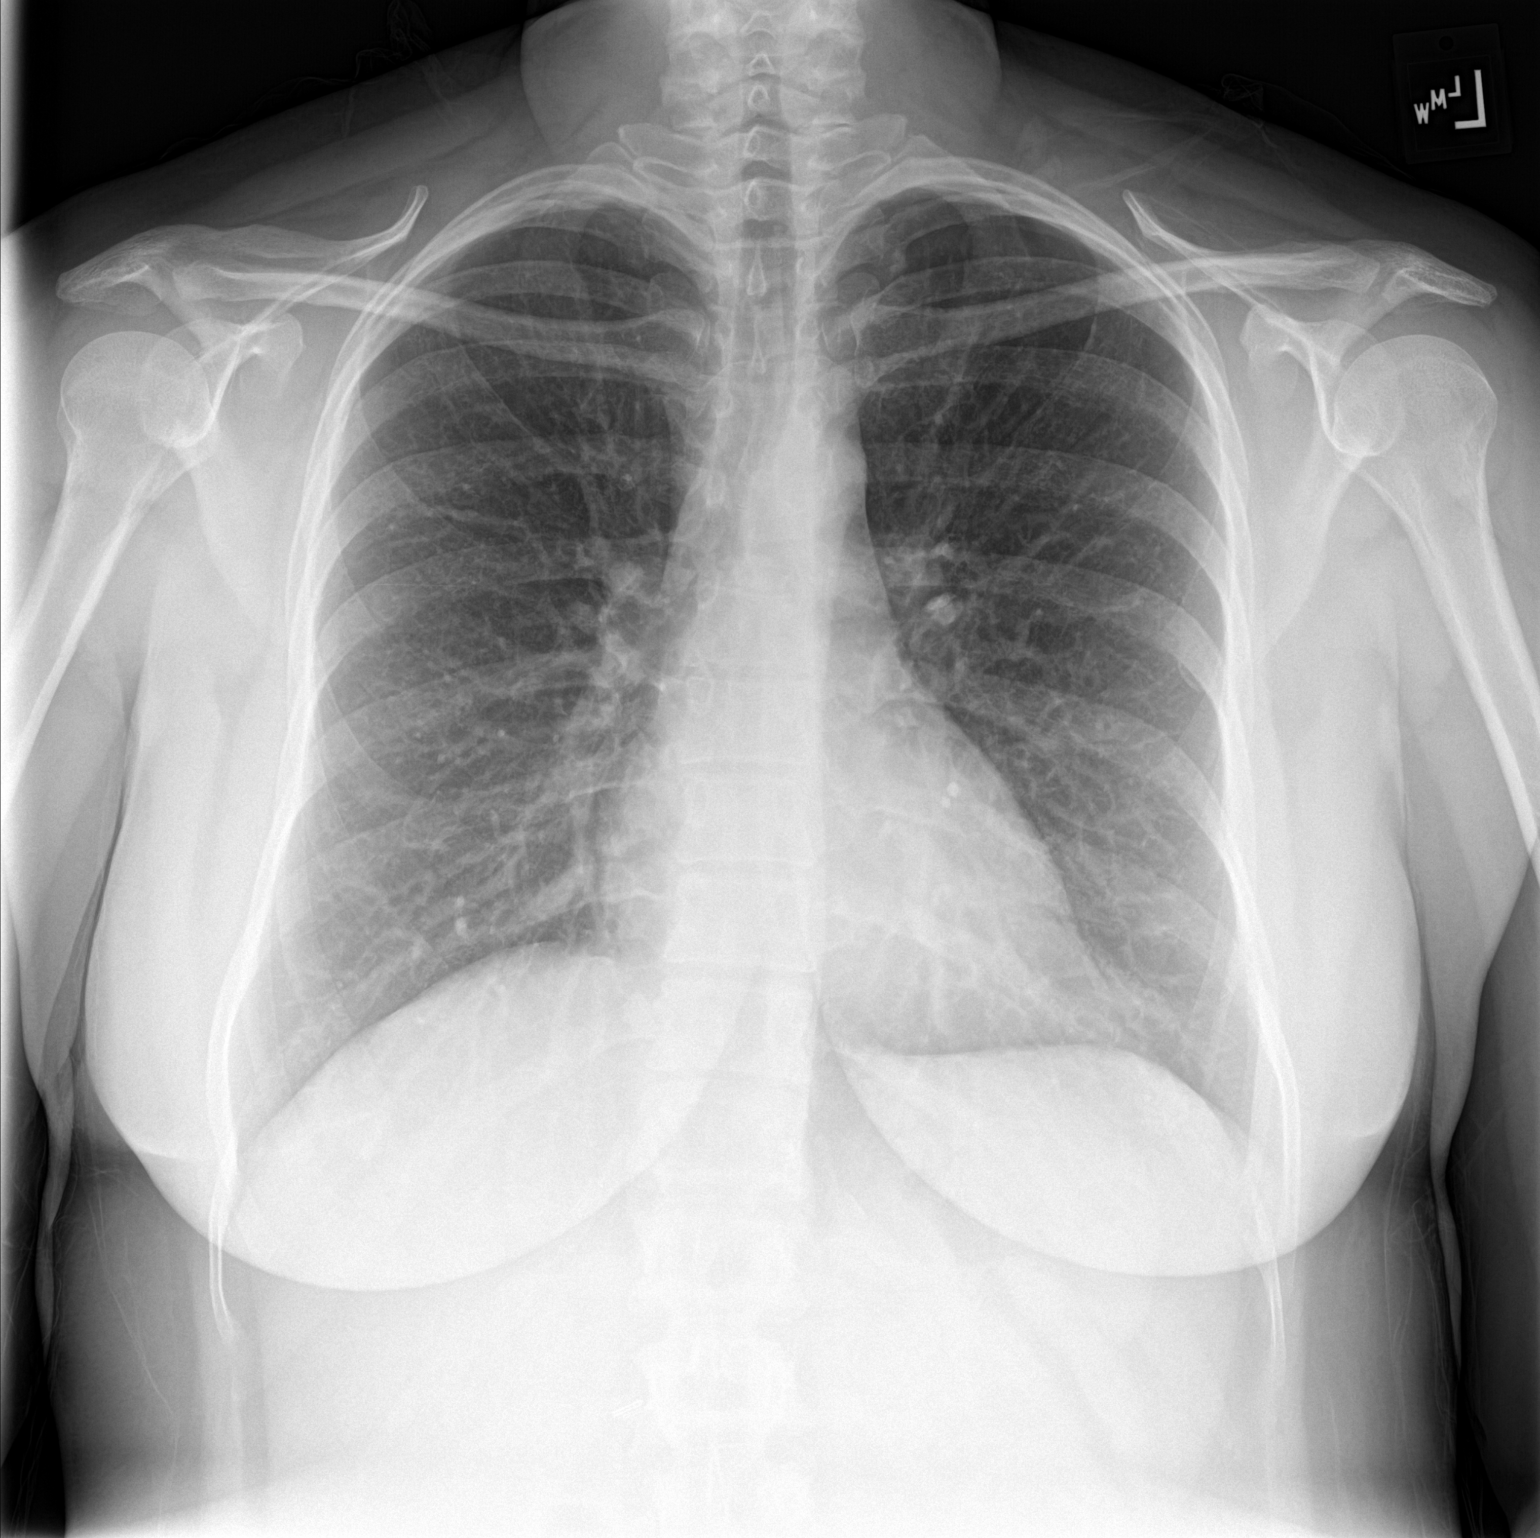

[chest lat]
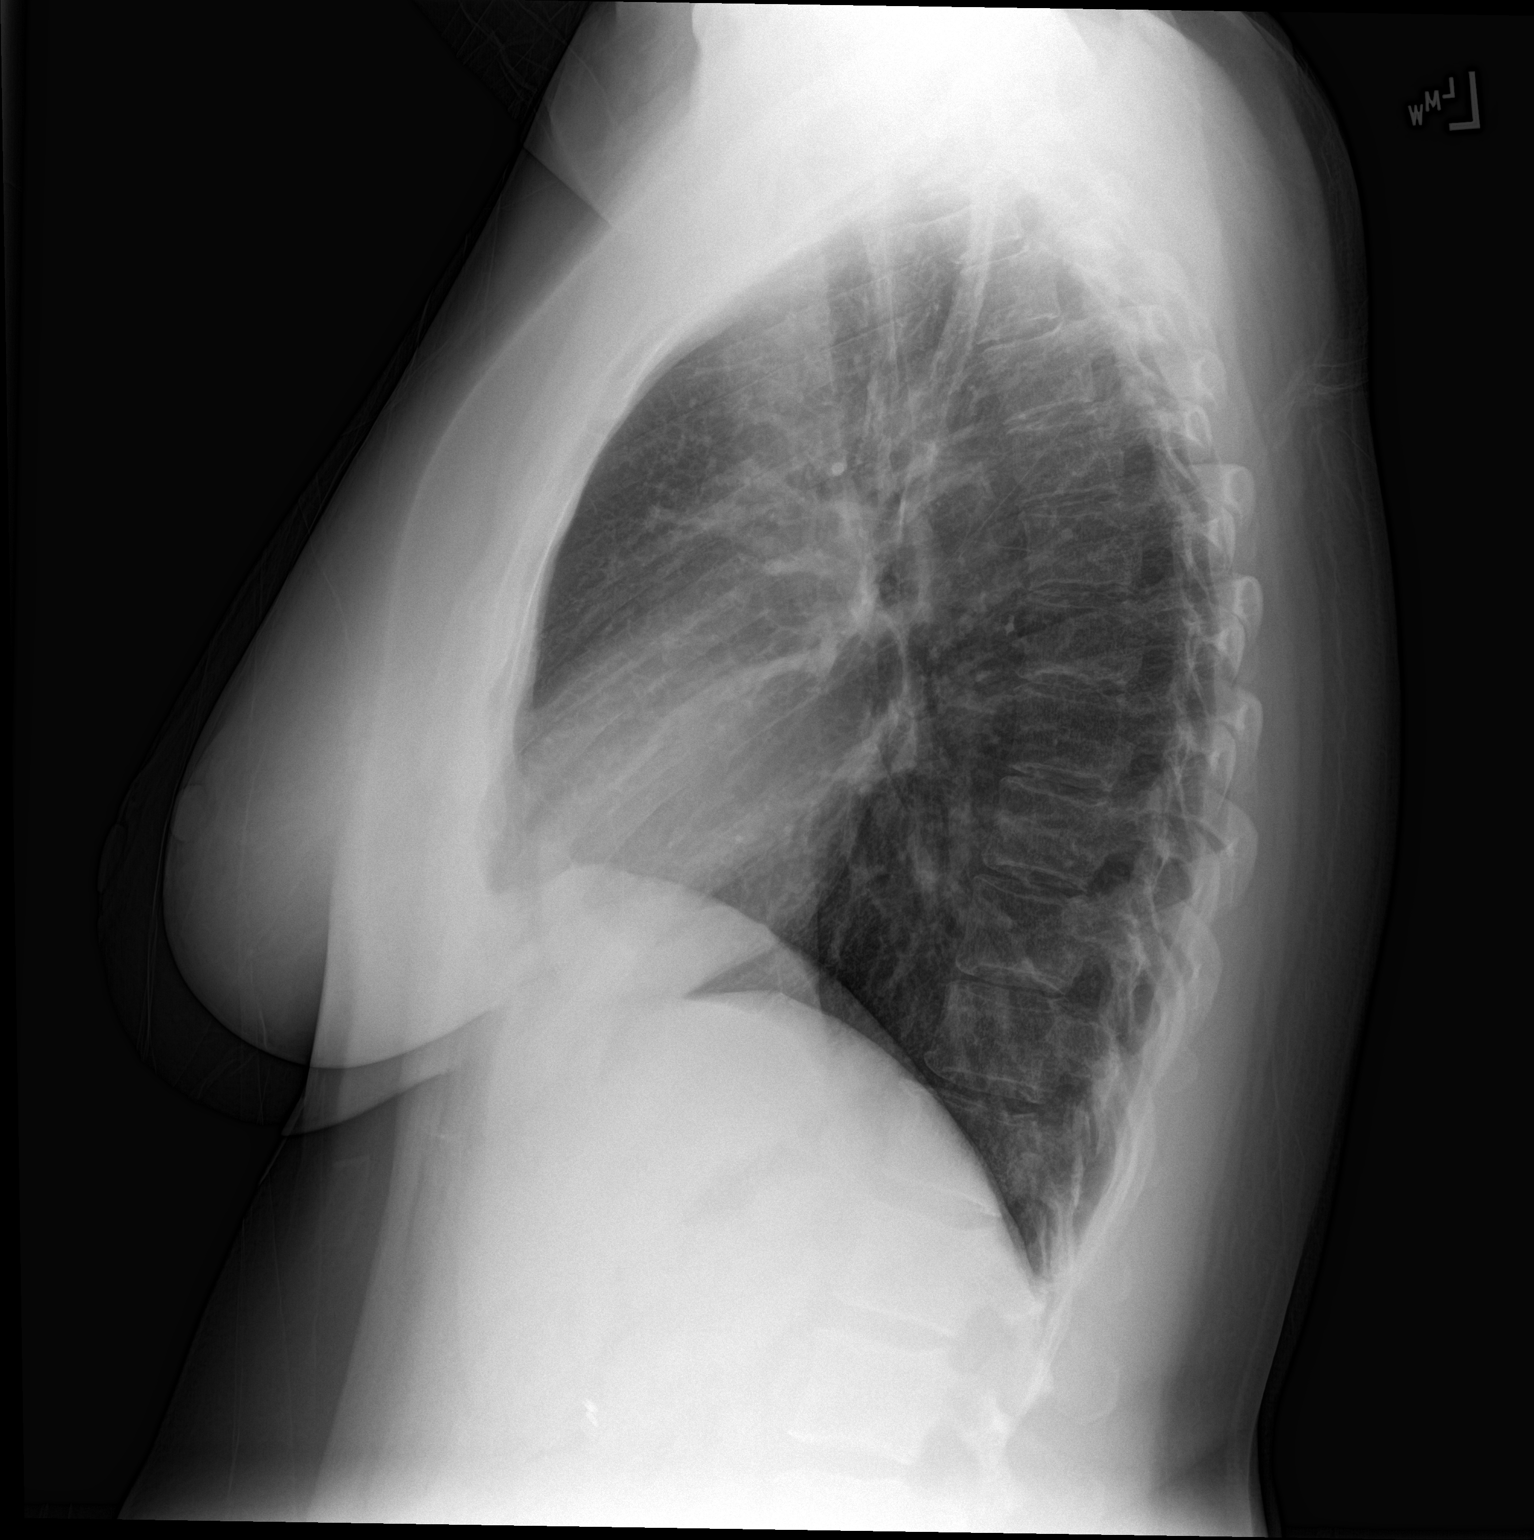

[2 of 2 positions shown; findings below may reference images not displayed]

FINDINGS: The heart size and mediastinal contours are within normal limits.
Both lungs are clear. No pneumothorax or pleural effusion is noted.
The visualized skeletal structures are unremarkable.
IMPRESSION: No active cardiopulmonary disease.

## 2017-03-14 MED ORDER — VENLAFAXINE ER 75 MG CAPSULE,EXTENDED RELEASE 24 HR
ORAL_CAPSULE | 5 refills | 0 days | Status: CP
Start: 2017-03-14 — End: 2018-01-11

## 2017-03-27 DIAGNOSIS — O0993 Supervision of high risk pregnancy, unspecified, third trimester: Secondary | ICD-10-CM | POA: Insufficient documentation

## 2017-03-28 ENCOUNTER — Ambulatory Visit: Admission: RE | Admit: 2017-03-28 | Discharge: 2017-03-28 | Disposition: A | Discharge status: Home / Self Care

## 2017-03-28 ENCOUNTER — Ambulatory Visit
Admission: RE | Admit: 2017-03-28 | Discharge: 2017-03-28 | Disposition: A | Discharge status: Home / Self Care | Admitting: Student in an Organized Health Care Education/Training Program

## 2017-03-28 DIAGNOSIS — O2341 Unspecified infection of urinary tract in pregnancy, first trimester: Principal | ICD-10-CM

## 2017-03-28 DIAGNOSIS — R109 Unspecified abdominal pain: Secondary | ICD-10-CM

## 2017-03-28 MED ORDER — AMOXICILLIN 875 MG-POTASSIUM CLAVULANATE 125 MG TABLET
ORAL_TABLET | Freq: Two times a day (BID) | ORAL | 0 refills | 0.00000 days | Status: CP
Start: 2017-03-28 — End: 2017-04-04

## 2017-03-29 ENCOUNTER — Other Ambulatory Visit: Payer: Self-pay | Admitting: Obstetrics and Gynecology

## 2017-03-29 DIAGNOSIS — Z369 Encounter for antenatal screening, unspecified: Secondary | ICD-10-CM

## 2017-03-29 LAB — OB RESULTS CONSOLE HEPATITIS B SURFACE ANTIGEN: Hepatitis B Surface Ag: NEGATIVE

## 2017-03-31 LAB — OB RESULTS CONSOLE RUBELLA ANTIBODY, IGM: Rubella: IMMUNE

## 2017-03-31 LAB — OB RESULTS CONSOLE VARICELLA ZOSTER ANTIBODY, IGG: Varicella: IMMUNE

## 2017-04-10 ENCOUNTER — Other Ambulatory Visit: Payer: Self-pay | Admitting: *Deleted

## 2017-04-10 DIAGNOSIS — Z3401 Encounter for supervision of normal first pregnancy, first trimester: Secondary | ICD-10-CM

## 2017-04-13 ENCOUNTER — Ambulatory Visit (HOSPITAL_BASED_OUTPATIENT_CLINIC_OR_DEPARTMENT_OTHER)
Admission: RE | Admit: 2017-04-13 | Discharge: 2017-04-13 | Disposition: A | Discharge status: Home / Self Care | Payer: BC Managed Care – PPO | Source: Ambulatory Visit | Attending: Maternal & Fetal Medicine | Admitting: Maternal & Fetal Medicine

## 2017-04-13 ENCOUNTER — Ambulatory Visit
Admission: RE | Admit: 2017-04-13 | Discharge: 2017-04-13 | Disposition: A | Discharge status: Home / Self Care | Payer: BC Managed Care – PPO | Source: Ambulatory Visit | Attending: Maternal & Fetal Medicine | Admitting: Maternal & Fetal Medicine

## 2017-04-13 ENCOUNTER — Encounter: Payer: Self-pay | Admitting: Urology

## 2017-04-13 ENCOUNTER — Ambulatory Visit: Payer: BC Managed Care – PPO | Admitting: Urology

## 2017-04-13 ENCOUNTER — Ambulatory Visit: Payer: BC Managed Care – PPO

## 2017-04-13 VITALS — BP 106/71 | HR 111 | Ht 63.0 in | Wt 211.0 lb

## 2017-04-13 VITALS — BP 122/70 | HR 102 | Temp 97.7°F | Resp 18 | Ht 64.8 in | Wt 211.8 lb

## 2017-04-13 DIAGNOSIS — N390 Urinary tract infection, site not specified: Secondary | ICD-10-CM

## 2017-04-13 DIAGNOSIS — Z369 Encounter for antenatal screening, unspecified: Secondary | ICD-10-CM

## 2017-04-13 DIAGNOSIS — Z87442 Personal history of urinary calculi: Secondary | ICD-10-CM | POA: Diagnosis not present

## 2017-04-13 DIAGNOSIS — O09521 Supervision of elderly multigravida, first trimester: Secondary | ICD-10-CM | POA: Insufficient documentation

## 2017-04-13 DIAGNOSIS — Z3A12 12 weeks gestation of pregnancy: Secondary | ICD-10-CM | POA: Diagnosis not present

## 2017-04-13 DIAGNOSIS — R350 Frequency of micturition: Secondary | ICD-10-CM

## 2017-04-13 DIAGNOSIS — Z8482 Family history of sudden infant death syndrome: Secondary | ICD-10-CM

## 2017-04-13 LAB — URINALYSIS, COMPLETE
BILIRUBIN UA: NEGATIVE
GLUCOSE, UA: NEGATIVE
KETONES UA: NEGATIVE
LEUKOCYTES UA: NEGATIVE
Nitrite, UA: NEGATIVE
PH UA: 6 (ref 5.0–7.5)
PROTEIN UA: NEGATIVE
SPEC GRAV UA: 1.015 (ref 1.005–1.030)
UUROB: 1 mg/dL (ref 0.2–1.0)

## 2017-04-13 LAB — MICROSCOPIC EXAMINATION
BACTERIA UA: NONE SEEN
RBC, UA: NONE SEEN /hpf (ref 0–?)
WBC UA: NONE SEEN /HPF (ref 0–?)

## 2017-04-13 MED ORDER — CEPHALEXIN 250 MG PO CAPS: ORAL_CAPSULE | ORAL | 1 refills | Status: DC

## 2017-04-13 NOTE — Progress Notes (Signed)
Patient seen by me, agree with assessment and plan as outlined in [facility] note.

## 2017-04-13 NOTE — Progress Notes (Signed)
Referring Provider:  [facility] Length of Consultation: 60 minutes  Ms. Brooke Rush was referred to [facility] for genetic counseling because of advanced maternal age.  The patient will be 41 years old at the time of delivery.  This note summarizes the information we discussed.    We explained that the chance of a chromosome abnormality increases with maternal age.  Chromosomes and examples of chromosome problems were reviewed.  Humans typically have 46 chromosomes in each cell, with half passed through each sperm and egg.  Any change in the number or structure of chromosomes can increase the risk of problems in the physical and mental development of a pregnancy.   Based upon age of the patient, the chance of any chromosome abnormality was 1 in 22. The chance of Down syndrome, the most common chromosome problem associated with maternal age, was 1 in 21.  The risk of chromosome problems is in addition to the 3% general population risk for birth defects and mental retardation.  The greatest chance, of course, is that the baby would be born in good health.  We discussed the following prenatal screening and testing options for this pregnancy:  First trimester screening, which includes nuchal translucency ultrasound screen and first trimester maternal serum marker screening.  The nuchal translucency has approximately an 80% detection rate for Down syndrome and can be positive for other chromosome abnormalities as well as heart defects.  When combined with a maternal serum marker screening, the detection rate is up to 90% for Down syndrome and up to 97% for trisomy 18.     The chorionic villus sampling procedure is available for first trimester chromosome analysis.  This involves the withdrawal of a small amount of chorionic villi (tissue from the developing placenta).  Risk of pregnancy loss is estimated to be approximately 1 in 200 to 1 in 100 (0.5 to 1%).  There  is approximately a 1% (1 in 100) chance that the CVS chromosome results will be unclear.  Chorionic villi cannot be tested for neural tube defects.     Maternal serum marker screening, a blood test that measures pregnancy proteins, can provide risk assessments for Down syndrome, trisomy 18, and open neural tube defects (spina bifida, anencephaly). Because it does not directly examine the fetus, it cannot positively diagnose or rule out these problems.  Targeted ultrasound uses high frequency sound waves to create an image of the developing fetus.  An ultrasound is often recommended as a routine means of evaluating the pregnancy.  It is also used to screen for fetal anatomy problems (for example, a heart defect) that might be suggestive of a chromosomal or other abnormality.   Amniocentesis involves the removal of a small amount of amniotic fluid from the sac surrounding the fetus with the use of a thin needle inserted through the maternal abdomen and uterus.  Ultrasound guidance is used throughout the procedure.  Fetal cells from amniotic fluid are directly evaluated and > 99.5% of chromosome problems and > 98% of open neural tube defects can be detected. This procedure is generally performed after the 15th week of pregnancy.  The main risks to this procedure include complications leading to miscarriage in less than 1 in 200 cases (0.5%).  We also reviewed the availability of cell free fetal DNA testing from maternal blood to determine whether or not the baby may have either Down syndrome, trisomy 11, or trisomy 78.  This test utilizes a maternal blood sample and  DNA sequencing technology to isolate circulating cell free fetal DNA from maternal plasma.  The fetal DNA can then be analyzed for DNA sequences that are derived from the three most common chromosomes involved in aneuploidy, chromosomes 13, 18, and 21.  If the overall amount of DNA is greater than the expected level for any of these chromosomes,  aneuploidy is suspected.  While we do not consider it a replacement for invasive testing and karyotype analysis, a negative result from this testing would be reassuring, though not a guarantee of a normal chromosome complement for the baby.  An abnormal result is certainly suggestive of an abnormal chromosome complement, though we would still recommend CVS or amniocentesis to confirm any findings from this testing.  Cystic Fibrosis and Spinal Muscular Atrophy (SMA) screening were also discussed with the patient. Both conditions are recessive, which means that both parents must be carriers in order to have a child with the disease.  Cystic fibrosis (CF) is one of the most common genetic conditions in persons of Caucasian ancestry.  This condition occurs in approximately 1 in 2,500 Caucasian persons and results in thickened secretions in the lungs, digestive, and reproductive systems.  For a baby to be at risk for having CF, both of the parents must be carriers for this condition.  Approximately 1 in 44 Caucasian persons is a carrier for CF.  Current carrier testing looks for the most common mutations in the gene for CF and can detect approximately 90% of carriers in the Caucasian population.  This means that the carrier screening can greatly reduce, but cannot eliminate, the chance for an individual to have a child with CF.  If an individual is found to be a carrier for CF, then carrier testing would be available for the partner. As part of [place] newborn screening profile, all babies born in the state of [place] will have a two-tier screening process.  Specimens are first tested to determine the concentration of immunoreactive trypsinogen (IRT).  The top 5% of specimens with the highest IRT values then undergo DNA testing using a panel of over 40 common CF mutations. SMA is a neurodegenerative disorder that leads to atrophy of skeletal muscle and overall weakness.  This condition is also more  prevalent in the Caucasian population, with 1 in 40-1 in 60 persons being a carrier and 1 in 6,000-1 in 10,000 children being affected.  There are multiple forms of the disease, with some causing death in infancy to other forms with survival into adulthood.  The genetics of SMA is complex, but carrier screening can detect up to 95% of carriers in the Caucasian population.  Similar to CF, a negative result can greatly reduce, but cannot eliminate, the chance to have a child with SMA.  We obtained a detailed family history and pregnancy history.  Ms. Brooke Rush has two children from a prior relationship, ages 9 and 37 years.  They are in good health.  Her 74 year old daughter has two sons, one of whom is a newborn.  The patient indicated that he has had some irregular breathing episodes, possibly related to maternal medication use during pregnancy.  We suggested that her daughter make sure the pediatrician is aware of the history of SIDS in the family so that he may be monitored appropriately.  The patient and her husband had one pregnancy prior to this, which resulted in the birth of a daughter, Brooke Rush.  Brooke Rush passed away at 10 days of life due  to SIDS.  The patient stated that autopsy results showed no abnormalities to indicate a specific cause (we are happy to review these records if the patient desires).  She believes that Brooke Rush got overheated which contributed to her passing, but there is no clear reason.  In addition, the patient's sister also had a daughter to die of SIDS at 57 months of age with a normal autopsy.  The family feels this may have been due to multiple people sleeping on an air mattress combined with substance use by the adults. There may be many causes for SIDS including undiagnosed medical conditions, environmental factors, structural anomalies and often unknown causes.  Numerous genetic conditions, including metabolic syndromes, have been associated with SIDS and some studies  speculate that a combination of genetic and environmental factors may be responsible.  However, without a clear diagnosis, it is difficult to offer testing or recurrence data for other family members. We encourage Ms. Brooke Rush to make her pediatrician aware of this history. There are also known cases of SIDS associated with conditions involving irregular heart beats.  She reported one paternal uncle who died suddenly in his 57s, but has no medical information about his condition. Also of note, the patient stated she has difficulty sweating and one paternal great uncle is on disability because he cannot sweat.  Ms. Brooke Rush also reports sensitive skin and a history of dental issues with at least one missing tooth.  If she has concerns about this combination of findings, which could be suspicious for a form of ectodermal dysplasia, she could consider meeting with a medical geneticist.  We were unable to find recent studies to connect ectodermal dysplasias with SIDS.       Brooke Rush, the father of the baby, has an adult brother who was diagnosed and treated for Diamond-Blackfan anemia, a condition that affects the bone marrow.  The condition most often presents in childhood, though there are more mild cases that can present later in life.  Approximately 55% of cases are the result of a new mutation, or change, in one of several genes known to be associated with this condition, while the other 45% are inherited from an affected parent.  If Brooke Rush is unaffected, then we would not expect this pregnancy to be at risk.  Lastly, Brooke Rush had a maternal aunt who passed away in infancy due to hydrocephaly.  No other details are known about her condition.  There may be various causes for hydrocephaly including obstructive processes, infections and genetic syndromes.  Without more information it is difficult to determine recurrence risks in the family. The remainder of the family history is unremarkable for birth defects,  developmental delays, recurrent pregnancy loss or known chromosome abnormalities.  Ms. Brooke Rush reported no complications in this pregnancy.  She reported no exposures that would be expected to increase the risk for birth defects.  She has been instructed to take zyrtec as needed for hives and a low dose baby aspirin daily.  After consideration of the options, Ms. Brooke Rush elected to proceed with cell free fetal DNA testing and a first trimester ultrasound today.  She declined CF and SMA carrier screening.  We recommend a detailed anatomy ultrasound after [redacted] weeks gestation and the option of a fetal echocardiogram after 22 weeks given the maternal age.  An ultrasound was performed at the time of the visit.  The gestational age was consistent with 12 weeks.  Fetal anatomy could not be assessed due to early gestational  age.  Please refer to the ultrasound report for details of that study.   Ms. Brooke Rush was encouraged to call with questions or concerns.  We can be contacted at [phone].   Wilburt Finlay, MS, CGC

## 2017-04-13 NOTE — Progress Notes (Signed)
04/13/2017 1:44 PM   Brooke Rush April 06, 1976 [id number]  Referring provider: Coral Spikes, DO [address] [place], [place] [address]  Chief Complaint  Patient presents with  . Nephrolithiasis    New Patient    HPI: Brooke Rush is a 41 year old female who presents today for evaluation of recurrent UTI and possible stones.  She is [redacted] weeks pregnant and approximately 3-4 weeks ago began to have urinary frequency, urgency and low back pain.  She on 10/13 passed a fairly good sized stone.  She has had persistent intermittent symptoms of frequency, urgency and mild back pain.  She was seen at [facility] on 10/23 and urine culture was positive for E. coli.  A renal ultrasound was performed which showed no hydronephrosis or calculi.  She has a history of recurrent UTIs with her first pregnancy.  She also has a history of stone disease and underwent shockwave lithotripsy in 2006.  She presently has minimal symptoms of back pain, frequency and urgency.  She has noted increased symptoms after intercourse.  PMH: Past Medical History:  Diagnosis Date  . Arthritis    in her neck  . Constipation    2/2 pain medication  . Fibromyalgia    Diagnosed by Dr. Derrel Nip  . GERD (gastroesophageal reflux disease)   . Kidney stones   . Migraines     Surgical History: Past Surgical History:  Procedure Laterality Date  . CARPAL TUNNEL RELEASE Bilateral 1998  . CHOLECYSTECTOMY  2011  . HEMORROIDECTOMY     Dr. Tamala Julian  . LEEP    . NASAL SEPTUM SURGERY  2011  . NASAL SINUS SURGERY  2011   Dr. Pryor Ochoa  . TONSILLECTOMY AND ADENOIDECTOMY  2013    Home Medications:  Allergies as of 04/13/2017   No Known Allergies     Medication List        Accurate as of 04/13/17  1:44 PM. Always use your most recent med list.          acetaminophen 500 MG tablet Commonly known as:  TYLENOL Take by mouth.   cephALEXin 250 MG capsule Commonly known as:  KEFLEX 1 po as directed     cetirizine 10 MG tablet Commonly known as:  ZYRTEC Take 10 mg by mouth daily.   cholecalciferol 1000 units tablet Commonly known as:  VITAMIN D Take 2,000 Units by mouth daily. Reported on 11/30/2015   docusate sodium 100 MG capsule Commonly known as:  COLACE Take 100 mg 2 (two) times daily by mouth.   fluticasone 50 MCG/ACT nasal spray Commonly known as:  FLONASE Place 2 sprays into both nostrils daily.   prenatal multivitamin Tabs tablet Take 1 tablet daily at 12 noon by mouth.   PROAIR HFA 108 (90 Base) MCG/ACT inhaler Generic drug:  albuterol   PROVENTIL HFA 108 (90 Base) MCG/ACT inhaler Generic drug:  albuterol Inhale into the lungs.   valACYclovir 1000 MG tablet Commonly known as:  VALTREX TAKE 1 TABLET TWICE A DAY       Allergies: No Known Allergies  Family History: Family History  Problem Relation Age of Onset  . Arthritis Mother        Degenerative Disc Dz  . Obesity Mother   . Alcohol abuse Father   . Hyperlipidemia Father   . Heart disease Father        CAD - CABG age 68  . Kidney disease Maternal Grandmother   . Heart disease Maternal Grandmother   .  Heart disease Maternal Grandfather   . Hyperlipidemia Maternal Grandfather   . Hypertension Maternal Grandfather     Social History:  reports that she quit smoking about 9 months ago. She has a 21.00 pack-year smoking history. she has never used smokeless tobacco. She reports that she drinks alcohol. She reports that she does not use drugs.  ROS: UROLOGY Frequent Urination?: Yes Hard to postpone urination?: Yes Burning/pain with urination?: Yes Get up at night to urinate?: No Leakage of urine?: Yes Urine stream starts and stops?: No Trouble starting stream?: No Do you have to strain to urinate?: No Blood in urine?: Yes Urinary tract infection?: Yes Sexually transmitted disease?: No Injury to kidneys or bladder?: No Painful intercourse?: Yes Weak stream?: No Currently pregnant?:  Yes Vaginal bleeding?: No Last menstrual period?: n  Gastrointestinal Nausea?: Yes Vomiting?: No Indigestion/heartburn?: No Diarrhea?: No Constipation?: No  Constitutional Fever: No Night sweats?: No Weight loss?: No Fatigue?: Yes  Skin Skin rash/lesions?: Yes Itching?: Yes  Eyes Blurred vision?: No Double vision?: No  Ears/Nose/Throat Sore throat?: No Sinus problems?: Yes  Hematologic/Lymphatic Swollen glands?: No Easy bruising?: Yes  Cardiovascular Leg swelling?: Yes Chest pain?: No  Respiratory Cough?: No Shortness of breath?: Yes  Endocrine Excessive thirst?: No  Musculoskeletal Back pain?: Yes Joint pain?: Yes  Neurological Headaches?: Yes Dizziness?: Yes  Psychologic Depression?: No Anxiety?: No  Physical Exam: BP 106/71   Pulse (!) 111   Ht 5\' 3"  (1.6 m)   Wt 211 lb (95.7 kg)   LMP 01/15/2017   BMI 37.38 kg/m   Constitutional:  Alert and oriented, No acute distress. HEENT: [place] AT, moist mucus membranes.  Trachea midline, no masses. Cardiovascular: No clubbing, cyanosis, or edema. Respiratory: Normal respiratory effort, no increased work of breathing. GI: Abdomen is soft, nontender, nondistended, no abdominal masses GU: No CVA tenderness. Skin: No rashes, bruises or suspicious lesions. Lymph: No cervical or inguinal adenopathy. Neurologic: Grossly intact, no focal deficits, moving all 4 extremities. Psychiatric: Normal mood and affect.  Laboratory Data: Lab Results  Component Value Date   WBC 8.9 05/21/2015   HGB 14.2 05/21/2015   HCT 43.5 05/21/2015   MCV 97.1 05/21/2015   PLT 268 05/21/2015    Lab Results  Component Value Date   CREATININE 0.59 05/21/2015     Lab Results  Component Value Date   HGBA1C 5.6 09/18/2014    Urinalysis Dipstick with trace blood/microscopy negative  Pertinent Imaging: Ultrasound images were not available for review  Assessment & Plan:    41 year old female in first trimester  pregnancy recently completing antibiotics for E. coli UTI and recently passed a small stone.  Renal ultrasound shows no hydronephrosis or nephrolithiasis.  She does relate to postcoital symptoms.  I recommended starting on postcoital prophylaxis with cephalexin 250 mg after intercourse.  She will call for increasing symptoms.  Discussed increasing her fluid intake.  - Urinalysis, Complete   Return if symptoms worsen or fail to improve.  Abbie Sons, [place] [address], [place], [place] [address] [phone]

## 2017-04-13 NOTE — Progress Notes (Signed)
Patient seen by me, agree with assessment and plan as outlined in [facility] note.

## 2017-04-20 LAB — MISC LABCORP TEST (SEND OUT): LABCORP TEST CODE: 451934

## 2017-04-24 ENCOUNTER — Telehealth: Payer: Self-pay | Admitting: Obstetrics and Gynecology

## 2017-04-24 NOTE — Telephone Encounter (Signed)
We left a message for Ms. Brooke Rush to call us regarding the results of her recent MaterniT21 testing which yielded NEGATIVE results.  The patient's specimen showed DNA consistent with two copies of chromosomes 21, 18 and 13.  The sensitivity for trisomy 65, trisomy 86 and trisomy 74 using this testing are reported as 99%, 99% and 91% respectively.  Thus, while the results of this testing are highly accurate, they are not considered diagnostic at this time.  Should more definitive information be desired, the patient may still consider amniocentesis.   As requested to know by the patient, sex chromosome analysis was included for this sample.  We are happy to disclose the gender when we speak. This is predicted with >97% accuracy.  A maternal serum AFP only should be considered if screening for neural tube defects is desired.  Wilburt Finlay, MS, CGC

## 2017-05-01 ENCOUNTER — Telehealth: Payer: Self-pay | Admitting: Obstetrics and Gynecology

## 2017-05-01 NOTE — Telephone Encounter (Signed)
The patient was informed of the results of her recent MaterniT21 testing which yielded NEGATIVE results.  The patient's specimen showed DNA consistent with two copies of chromosomes 21, 18 and 13.  The sensitivity for trisomy 27, trisomy 41 and trisomy 28 using this testing are reported as 99.1%, 99.9% and 91.7% respectively.  Thus, while the results of this testing are highly accurate, they are not considered diagnostic at this time.  Should more definitive information be desired, the patient may still consider amniocentesis.   As requested to know by the patient, sex chromosome analysis was included for this sample.  Results was consistent with a female fetus, as Y chromosome material was present. This is predicted with >97% accuracy.  A maternal serum AFP only should be considered if screening for neural tube defects is desired.   Wilburt Finlay, MS, CGC

## 2017-05-19 MED ORDER — SULFAMETHOXAZOLE 800 MG-TRIMETHOPRIM 160 MG TABLET
ORAL_TABLET | ORAL | 0 refills | 0 days
Start: 2017-05-19 — End: 2018-01-24

## 2017-05-19 MED FILL — SULFAMETHOXAZOLE/TRIMETHO/800-160/TABS: SULFAMETHOXAZOLE/TRIMETHO/800-160/TABS | 7 days supply | Qty: 14 | Fill #0

## 2017-06-06 NOTE — L&D Delivery Note (Signed)
Delivery Note At 10:58 PM a viable and female child was delivered via Vaginal, Spontaneous (PresentationVTX :LOA ;  ).  APGAR: 8, 9; weight  .un assigned    Placenta status:Intact  , .  Cord:3V   with the following complications: loose nuchal.   Delivery without difficulty . Marland Kitchen Good hemostasis Anesthesia:CLE   Episiotomy:  none Lacerations: second degree  Suture Repair: 2.0 3.0 vicryl Est. Blood Loss (mL):  200 Mom to postpartum.  Baby to Couplet care / Skin to Skin.  Brooke Rush Brooke Rush 10/16/2017, 11:18 PM

## 2017-06-08 ENCOUNTER — Other Ambulatory Visit: Payer: Self-pay | Admitting: Obstetrics and Gynecology

## 2017-06-08 DIAGNOSIS — Z3689 Encounter for other specified antenatal screening: Secondary | ICD-10-CM

## 2017-06-22 ENCOUNTER — Ambulatory Visit
Admission: RE | Admit: 2017-06-22 | Discharge: 2017-06-22 | Disposition: A | Discharge status: Home / Self Care | Payer: BC Managed Care – PPO | Source: Ambulatory Visit | Attending: Maternal & Fetal Medicine | Admitting: Maternal & Fetal Medicine

## 2017-06-22 ENCOUNTER — Telehealth: Payer: Self-pay | Admitting: *Deleted

## 2017-06-22 DIAGNOSIS — O280 Abnormal hematological finding on antenatal screening of mother: Secondary | ICD-10-CM | POA: Insufficient documentation

## 2017-06-22 DIAGNOSIS — O09522 Supervision of elderly multigravida, second trimester: Secondary | ICD-10-CM | POA: Diagnosis not present

## 2017-06-22 DIAGNOSIS — Z3A22 22 weeks gestation of pregnancy: Secondary | ICD-10-CM | POA: Diagnosis not present

## 2017-06-22 DIAGNOSIS — Z3689 Encounter for other specified antenatal screening: Secondary | ICD-10-CM

## 2017-06-22 NOTE — Telephone Encounter (Signed)
Appointment scheduled with [place] on July 06, 2017 at 9 am.  Called and left detailed message on Patients voicemail.

## 2017-07-13 ENCOUNTER — Encounter
Admit: 2017-07-13 | Discharge: 2017-07-14 | Payer: BLUE CROSS/BLUE SHIELD | Attending: Diagnostic Radiology | Primary: Diagnostic Radiology

## 2017-07-13 ENCOUNTER — Other Ambulatory Visit: Payer: Self-pay

## 2017-07-13 ENCOUNTER — Observation Stay
Admission: EM | Admit: 2017-07-13 | Discharge: 2017-07-13 | Disposition: A | Discharge status: Home / Self Care | Payer: BC Managed Care – PPO | Attending: Obstetrics & Gynecology | Admitting: Obstetrics & Gynecology

## 2017-07-13 DIAGNOSIS — O4700 False labor before 37 completed weeks of gestation, unspecified trimester: Principal | ICD-10-CM

## 2017-07-13 DIAGNOSIS — K219 Gastro-esophageal reflux disease without esophagitis: Secondary | ICD-10-CM | POA: Insufficient documentation

## 2017-07-13 DIAGNOSIS — M47812 Spondylosis without myelopathy or radiculopathy, cervical region: Secondary | ICD-10-CM | POA: Insufficient documentation

## 2017-07-13 DIAGNOSIS — E559 Vitamin D deficiency, unspecified: Secondary | ICD-10-CM | POA: Insufficient documentation

## 2017-07-13 DIAGNOSIS — Z79899 Other long term (current) drug therapy: Secondary | ICD-10-CM | POA: Diagnosis not present

## 2017-07-13 DIAGNOSIS — E669 Obesity, unspecified: Secondary | ICD-10-CM | POA: Diagnosis not present

## 2017-07-13 DIAGNOSIS — F419 Anxiety disorder, unspecified: Secondary | ICD-10-CM | POA: Diagnosis not present

## 2017-07-13 DIAGNOSIS — O99342 Other mental disorders complicating pregnancy, second trimester: Secondary | ICD-10-CM | POA: Diagnosis not present

## 2017-07-13 DIAGNOSIS — O99282 Endocrine, nutritional and metabolic diseases complicating pregnancy, second trimester: Secondary | ICD-10-CM | POA: Diagnosis not present

## 2017-07-13 DIAGNOSIS — O99212 Obesity complicating pregnancy, second trimester: Secondary | ICD-10-CM | POA: Diagnosis not present

## 2017-07-13 DIAGNOSIS — O9989 Other specified diseases and conditions complicating pregnancy, childbirth and the puerperium: Secondary | ICD-10-CM | POA: Diagnosis not present

## 2017-07-13 DIAGNOSIS — M797 Fibromyalgia: Secondary | ICD-10-CM | POA: Diagnosis not present

## 2017-07-13 DIAGNOSIS — O09522 Supervision of elderly multigravida, second trimester: Secondary | ICD-10-CM | POA: Diagnosis not present

## 2017-07-13 DIAGNOSIS — Z87891 Personal history of nicotine dependence: Secondary | ICD-10-CM | POA: Diagnosis not present

## 2017-07-13 DIAGNOSIS — Z3A25 25 weeks gestation of pregnancy: Secondary | ICD-10-CM | POA: Insufficient documentation

## 2017-07-13 DIAGNOSIS — O4702 False labor before 37 completed weeks of gestation, second trimester: Principal | ICD-10-CM | POA: Insufficient documentation

## 2017-07-13 DIAGNOSIS — F329 Major depressive disorder, single episode, unspecified: Secondary | ICD-10-CM | POA: Diagnosis not present

## 2017-07-13 DIAGNOSIS — O99612 Diseases of the digestive system complicating pregnancy, second trimester: Secondary | ICD-10-CM | POA: Insufficient documentation

## 2017-07-13 LAB — URINALYSIS, ROUTINE W REFLEX MICROSCOPIC
Bilirubin Urine: NEGATIVE
Glucose, UA: NEGATIVE mg/dL
Ketones, ur: 5 mg/dL — AB
Leukocytes, UA: NEGATIVE
NITRITE: NEGATIVE
Protein, ur: NEGATIVE mg/dL
Specific Gravity, Urine: 1.009 (ref 1.005–1.030)
pH: 5 (ref 5.0–8.0)

## 2017-07-13 NOTE — OB Triage Note (Signed)
Pt presents c/o ctx since 1030 this morning. She states she had 12-13 in an hour but was unable to tell me just how far apart they were. Pt rating ctx 4/10 for pain. Denies any LOF, or vaginal bleeding. Reports positive fetal movement.pt states she has had something to eat and plenty to drink today. No recent intercourse. Vitals WNL. Will continue to monitor.

## 2017-07-21 NOTE — Discharge Summary (Signed)
Brooke Rush is a 42 y.o. female. She is a [id number] at [redacted]w[redacted]d gestation. Patient's last menstrual period was 01/15/2017. Estimated Date of Delivery: 10/22/17  Prenatal care site: [facility] Chief complaint: contractions  Location: uterus Onset/timing: today, Duration: all day since 10:30 Quality: crampy Severity: mild Aggravating or alleviating conditions: nothing makes better or worse Associated signs/symptoms: no VB.no LOF,  Active fetal movement Context:  Tonilynn has a high risk pregnancy complicated by  Obesity kidney stones H/o SIDS loss Vit d deficiency Fibromyalgia Anxiety Depression AMA (76) H/o Macrosomic baby 11#5oz SVD And FOB with brother with Loree Fee anemia  She presents today with complaints of contractions happening 12x per hour.   Maternal Medical History:   Past Medical History:  Diagnosis Date  . Arthritis    in her neck  . Constipation    2/2 pain medication  . Fibromyalgia    Diagnosed by Dr. Derrel Nip  . GERD (gastroesophageal reflux disease)   . Kidney stones   . Migraines     Past Surgical History:  Procedure Laterality Date  . CARPAL TUNNEL RELEASE Bilateral 1998  . CHOLECYSTECTOMY  2011  . ETHMOIDECTOMY Bilateral 12/02/2015   Procedure: LEFT TOTAL ETHMOIDECTOMY  RIGHT  REVISION TOTAL ETHMOIDECTOMY;  Surgeon: Carloyn Manner, MD;  Location: [place];  Service: ENT;  Laterality: Bilateral;  . FRONTAL SINUS EXPLORATION Bilateral 12/02/2015   Procedure: FRONTAL SINUSOTOMY;  Surgeon: Carloyn Manner, MD;  Location: [place];  Service: ENT;  Laterality: Bilateral;  . HEMORROIDECTOMY     Dr. Tamala Julian  . IMAGE GUIDED SINUS SURGERY N/A 12/02/2015   Procedure: IMAGE GUIDED SINUS SURGERY;  Surgeon: Carloyn Manner, MD;  Location: [place];  Service: ENT;  Laterality: N/A;  GAVE DISK TO [facility] 6/22 DEE GEL [place]  . LEEP    . MAXILLARY ANTROSTOMY Left 12/02/2015   Procedure: MAXILLARY  ANTROSTOMY;  Surgeon: Carloyn Manner, MD;  Location: [place];  Service: ENT;  Laterality: Left;  . NASAL SEPTUM SURGERY  2011  . NASAL SINUS SURGERY  2011   Dr. Pryor Ochoa  . REMOVAL OF EAR TUBE Bilateral 12/02/2015   Procedure: Bilateral Tube removal with Gel Film Myringoplasty;  Surgeon: Carloyn Manner, MD;  Location: [place];  Service: ENT;  Laterality: Bilateral;  . TONSILLECTOMY AND ADENOIDECTOMY  2013    No Known Allergies  Prior to Admission medications   Medication Sig Start Date End Date Taking? Authorizing Provider  acetaminophen (TYLENOL) 500 MG tablet Take by mouth.   Yes [provider]  albuterol (PROVENTIL HFA) 108 (90 Base) MCG/ACT inhaler Inhale into the lungs. 03/10/17 03/10/18 Yes [provider]  cetirizine (ZYRTEC) 10 MG tablet Take 10 mg by mouth daily.   Yes [provider]  cholecalciferol (VITAMIN D) 1000 UNITS tablet Take 2,000 Units by mouth daily. Reported on 11/30/2015   Yes [provider]  cycloSPORINE (RESTASIS) 0.05 % ophthalmic emulsion 1 drop 2 (two) times daily.   Yes [provider]  docusate sodium (COLACE) 100 MG capsule Take 100 mg 2 (two) times daily by mouth.   Yes [provider]  fluticasone (FLONASE) 50 MCG/ACT nasal spray Place 2 sprays into both nostrils daily. 06/22/15  Yes Doss, Velora Heckler, RN  Prenatal Vit-Fe Fumarate-FA (PRENATAL MULTIVITAMIN) TABS tablet Take 1 tablet daily at 12 noon by mouth.   Yes [provider]  cephALEXin (KEFLEX) 250 MG capsule 1 po as directed Patient not taking: Reported on 06/22/2017 04/13/17  Abbie Sons, MD  PROAIR HFA 108 [phone] Base) MCG/ACT inhaler  03/10/17   [provider]  valACYclovir (VALTREX) 1000 MG tablet TAKE 1 TABLET TWICE A DAY Patient not taking: Reported on 06/22/2017 02/16/15   Rubbie Battiest, RN     Social History: She  reports that she quit smoking about 12 months ago. She has a 21.00 pack-year smoking  history. she has never used smokeless tobacco. She reports that she drinks alcohol. She reports that she does not use drugs.  Family History: family history includes Alcohol abuse in her father; Arthritis in her mother; Heart disease in her father, maternal grandfather, and maternal grandmother; Hyperlipidemia in her father and maternal grandfather; Hypertension in her maternal grandfather; Kidney disease in her maternal grandmother; Obesity in her mother.   Review of Systems: A full review of systems was performed and negative except as noted in the HPI.     O:  BP 130/62 (BP Location: Left Arm)   Pulse 100   Temp 98.1 F (36.7 C) (Oral)   Resp 20   Ht 5\' 4"  (1.626 m)   Wt 98.9 kg (218 lb)   LMP 01/15/2017   SpO2 97%   BMI 37.42 kg/m  No results found for this or any previous visit (from the past 48 hour(s)).   Constitutional: NAD, AAOx3  HE/ENT: extraocular movements grossly intact, moist mucous membranes CV: RRR PULM: nl respiratory effort, CTABL     Abd: gravid, non-tender, non-distended, soft      Ext: Non-tender, Nonedmeatous   Psych: mood appropriate, speech normal Pelvic deferred  FHT 130 Toco: NONE SVE: closed/long/high     A/P: 42 y.o. [redacted]w[redacted]d here for rule out preterm labor  Labor: not present.   Fetal Wellbeing: Reassuring Cat 1 tracing.  S/p PO hydration.    Reassurances given - no contractions and no labor.  D/c home stable, precautions reviewed, follow-up as scheduled.   ----- Larey Days, MD Attending Obstetrician and Gynecologist [facility], Department of [facility]

## 2017-08-23 ENCOUNTER — Encounter: Payer: Self-pay | Admitting: Dietician

## 2017-08-23 ENCOUNTER — Encounter: Payer: BC Managed Care – PPO | Attending: Obstetrics and Gynecology | Admitting: Dietician

## 2017-08-23 VITALS — BP 102/70 | Ht 64.0 in | Wt 225.4 lb

## 2017-08-23 DIAGNOSIS — O24410 Gestational diabetes mellitus in pregnancy, diet controlled: Secondary | ICD-10-CM | POA: Insufficient documentation

## 2017-08-23 DIAGNOSIS — O09523 Supervision of elderly multigravida, third trimester: Secondary | ICD-10-CM | POA: Insufficient documentation

## 2017-08-23 DIAGNOSIS — O09893 Supervision of other high risk pregnancies, third trimester: Secondary | ICD-10-CM | POA: Diagnosis not present

## 2017-08-23 DIAGNOSIS — Z713 Dietary counseling and surveillance: Secondary | ICD-10-CM | POA: Insufficient documentation

## 2017-08-23 MED FILL — HYDROCORTISONE CREAM 2.5%/2.5%/CRE: HYDROCORTISONE CREAM 2.5%/2.5%/CRE | 15 days supply | Qty: 1 | Fill #0

## 2017-08-23 NOTE — Patient Instructions (Signed)
   Continue working to control cafbohydrate portions to 3 servings (45g) or less with meals.   Continue to eat at regular intervals, every 3-5 hours during the day.

## 2017-08-23 NOTE — Progress Notes (Signed)
Diabetes Self-Management Education  Visit Type: First/Initial  Appt. Start Time: 1045 Appt. End Time: 0973  08/23/2017  Brooke Rush, identified by name and date of birth, is a 42 y.o. female with a diagnosis of Diabetes: Gestational Diabetes.   ASSESSMENT  Blood pressure 102/70, height 5\' 4"  (1.626 m), weight 225 lb 6.4 oz (102.2 kg), last menstrual period 01/15/2017. Body mass index is 38.69 kg/m.  Diabetes Self-Management Education - 08/23/17 1103      Visit Information   Visit Type  First/Initial      Initial Visit   Diabetes Type  Gestational Diabetes    Are you taking your medications as prescribed?  Yes    Date Diagnosed  07/2017      Health Coping   How would you rate your overall health?  Good      Psychosocial Assessment   Patient Belief/Attitude about Diabetes  Other (comment) stressed    Self-care barriers  None    Other persons present  Patient    Patient Concerns  Nutrition/Meal planning    Special Needs  None    Learning Readiness  Contemplating    How often do you need to have someone help you when you read instructions, pamphlets, or other written materials from your doctor or pharmacy?  1 - Never    What is the last grade level you completed in school?  college      Pre-Education Assessment   Patient understands the diabetes disease and treatment process.  Demonstrates understanding / competency    Patient understands incorporating nutritional management into lifestyle.  Needs Review    Patient undertands incorporating physical activity into lifestyle.  Needs Review    Patient understands using medications safely.  Demonstrates understanding / competency    Patient understands monitoring blood glucose, interpreting and using results  Demonstrates understanding / competency    Patient understands prevention, detection, and treatment of acute complications.  Needs Review    Patient understands prevention, detection, and treatment of chronic  complications.  Needs Review    Patient understands how to develop strategies to address psychosocial issues.  Needs Review    Patient understands how to develop strategies to promote health/change behavior.  Needs Review      Complications   How often do you check your blood sugar?  3-4 times/day    Fasting Blood glucose range (mg/dL)  70-129 88-149; sometimes eating during the night    Postprandial Blood glucose range (mg/dL)  130-179;70-129 105-179; sometimes less than 2 hours after eating    Number of hypoglycemic episodes per month  0 hypoglycemic symptoms once when BG 88; ate peanut butter crackers and milk    Have you had a dilated eye exam in the past 12 months?  Yes    Have you had a dental exam in the past 12 months?  Yes    Are you checking your feet?  Yes    How many days per week are you checking your feet?  7      Dietary Intake   Breakfast  at [employer]-- banana, yogurt and fruit parfait (Chick fila), or oatmeal    Snack (morning)  none    Lunch  often out-- chicken sandwich or tenders; steak every Wednesday, food choices vary; sometimes [employer] sandwich --saves chips for afternoon snack    Snack (afternoon)  carrots or potato chips (for energy due to fatigue) or crackers    Dinner  spaghetti, pizza, chicken pie, chicken sandwich, fish,  steak, green beans and shrimp with yeast rolls    Snack (evening)  none before going to sleep; usually wakes up during the night hungry -- eats peanut butter crackers and milk    Beverage(s)  water, 1c coffee in am with cream and splenda, occasional ginger ale or other soda if nauseated      Exercise   Exercise Type  Light (walking / raking leaves) yoga    How many days per week to you exercise?  3    How many minutes per day do you exercise?  60    Total minutes per week of exercise  180      Patient Education   Previous Diabetes Education  Yes (please comment) nursing school, job experience    Nutrition management   Role of diet in the  treatment of diabetes and the relationship between the three main macronutrients and blood glucose level;Meal timing in regards to the patients' current diabetes medication.;Meal options for control of blood glucose level and chronic complications.;Reviewed blood glucose goals for pre and post meals and how to evaluate the patients' food intake on their blood glucose level.;Other (comment) basic meal planning for 1900kcal; food safety    Physical activity and exercise   Role of exercise on diabetes management, blood pressure control and cardiac health.    Monitoring  Taught/discussed recording of test results and interpretation of SMBG.    Psychosocial adjustment  Role of stress on diabetes    Preconception care  Pregnancy and GDM  Role of pre-pregnancy blood glucose control on the development of the fetus;Role of family planning for patients with diabetes;Reviewed with patient blood glucose goals with pregnancy      Post-Education Assessment   Patient understands the diabetes disease and treatment process.  Demonstrates understanding / competency    Patient understands incorporating nutritional management into lifestyle.  Demonstrates understanding / competency    Patient undertands incorporating physical activity into lifestyle.  Needs Review    Patient understands using medications safely.  Demonstrates understanding / competency    Patient understands monitoring blood glucose, interpreting and using results  Demonstrates understanding / competency    Patient understands prevention, detection, and treatment of acute complications.  Needs Review    Patient understands prevention, detection, and treatment of chronic complications.  Needs Review    Patient understands how to develop strategies to address psychosocial issues.  Needs Review    Patient understands how to develop strategies to promote health/change behavior.  Needs Review      Outcomes   Expected Outcomes  Other (comment) patient  reports significant change will be difficult due to lack of time    Future DMSE  PRN    Program Status  Completed patient requested one visit due to prior knowledge base of diabetes       Individualized Plan for Diabetes Self-Management Training:   Learning Objective:  Patient will have a greater understanding of diabetes self-management. Patient education plan is to attend individual and/or group sessions per assessed needs and concerns.  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 She is testing BGs several times daily, and although fasting BGs are above goal, she states that many are not true fasting readings due to snacking during the night, and restless sleep. She has sometimes eaten a snack prior to 2hr post-meal BG test, which  could be resulting in higher readings after meals. Discussed potential need for medication to improve BG control.    Plan:   Patient Instructions   Continue working to control cafbohydrate portions to 3 servings (45g) or less with meals.   Continue to eat at regular intervals, every 3-5 hours during the day.    Expected Outcomes:  Other (comment)(patient reports significant change will be difficult due to lack of time)  Education material provided: Planning a Balanced Meal; Food Safety; Diabetes Prevention  If problems or questions, patient to contact team via:  Phone and Email  Future DSME appointment: PRN

## 2017-08-29 MED ORDER — HYDROCORTISONE ACETATE 25 MG RECTAL SUPPOSITORY
2 refills | 0 days
Start: 2017-08-29 — End: 2018-03-19

## 2017-08-29 MED ORDER — CYCLOBENZAPRINE 5 MG TABLET
ORAL_TABLET | ORAL | 2 refills | 0 days
Start: 2017-08-29 — End: 2018-01-24

## 2017-08-29 MED FILL — HYDROCORTISONE AC/25MG/SUPP: HYDROCORTISONE AC/25MG/SUPP | 10 days supply | Qty: 20 | Fill #0

## 2017-08-29 MED FILL — CYCLOBENZAPRINE HCL/5MG/TABS: CYCLOBENZAPRINE HCL/5MG/TABS | 10 days supply | Qty: 30 | Fill #0

## 2017-09-14 ENCOUNTER — Observation Stay
Admission: EM | Admit: 2017-09-14 | Discharge: 2017-09-14 | Disposition: A | Discharge status: Home / Self Care | Payer: BC Managed Care – PPO | Attending: Obstetrics and Gynecology | Admitting: Obstetrics and Gynecology

## 2017-09-14 DIAGNOSIS — O09523 Supervision of elderly multigravida, third trimester: Secondary | ICD-10-CM | POA: Diagnosis not present

## 2017-09-14 DIAGNOSIS — Z7982 Long term (current) use of aspirin: Secondary | ICD-10-CM | POA: Insufficient documentation

## 2017-09-14 DIAGNOSIS — K219 Gastro-esophageal reflux disease without esophagitis: Secondary | ICD-10-CM | POA: Insufficient documentation

## 2017-09-14 DIAGNOSIS — Z79899 Other long term (current) drug therapy: Secondary | ICD-10-CM | POA: Diagnosis not present

## 2017-09-14 DIAGNOSIS — M47812 Spondylosis without myelopathy or radiculopathy, cervical region: Secondary | ICD-10-CM | POA: Diagnosis not present

## 2017-09-14 DIAGNOSIS — Z87891 Personal history of nicotine dependence: Secondary | ICD-10-CM | POA: Insufficient documentation

## 2017-09-14 DIAGNOSIS — Z3A34 34 weeks gestation of pregnancy: Secondary | ICD-10-CM | POA: Diagnosis not present

## 2017-09-14 DIAGNOSIS — Z87442 Personal history of urinary calculi: Secondary | ICD-10-CM | POA: Insufficient documentation

## 2017-09-14 DIAGNOSIS — O9989 Other specified diseases and conditions complicating pregnancy, childbirth and the puerperium: Secondary | ICD-10-CM | POA: Insufficient documentation

## 2017-09-14 DIAGNOSIS — O99613 Diseases of the digestive system complicating pregnancy, third trimester: Secondary | ICD-10-CM | POA: Insufficient documentation

## 2017-09-14 DIAGNOSIS — Z0371 Encounter for suspected problem with amniotic cavity and membrane ruled out: Secondary | ICD-10-CM

## 2017-09-14 DIAGNOSIS — O24419 Gestational diabetes mellitus in pregnancy, unspecified control: Secondary | ICD-10-CM | POA: Diagnosis not present

## 2017-09-14 DIAGNOSIS — O42919 Preterm premature rupture of membranes, unspecified as to length of time between rupture and onset of labor, unspecified trimester: Secondary | ICD-10-CM | POA: Diagnosis present

## 2017-09-14 DIAGNOSIS — M797 Fibromyalgia: Secondary | ICD-10-CM | POA: Insufficient documentation

## 2017-09-14 LAB — HEMOGLOBIN A1C
Hgb A1c MFr Bld: 5.5 % (ref 4.8–5.6)
MEAN PLASMA GLUCOSE: 111.15 mg/dL

## 2017-09-14 LAB — GLUCOSE, CAPILLARY: GLUCOSE-CAPILLARY: 87 mg/dL (ref 65–99)

## 2017-09-14 LAB — TYPE AND SCREEN
ABO/RH(D): A POS
Antibody Screen: NEGATIVE

## 2017-09-14 LAB — ROM PLUS (ARMC ONLY): Rom Plus: NEGATIVE

## 2017-09-14 LAB — GLUCOSE, RANDOM: GLUCOSE: 95 mg/dL (ref 65–99)

## 2017-09-14 MED ORDER — OXYCODONE-ACETAMINOPHEN 5-325 MG PO TABS
1.0000 | ORAL_TABLET | ORAL | Status: DC | PRN
  Administered 2017-09-14: 1 via ORAL
  Filled 2017-09-14: qty 1

## 2017-09-14 MED ORDER — LACTATED RINGERS IV SOLN: 500.0000 mL | INTRAVENOUS | Status: DC | PRN

## 2017-09-14 MED ORDER — BETAMETHASONE SOD PHOS & ACET 6 (3-3) MG/ML IJ SUSP
INTRAMUSCULAR | Status: AC
  Filled 2017-09-14: qty 1

## 2017-09-14 MED ORDER — ACETAMINOPHEN 325 MG PO TABS: 650.0000 mg | ORAL_TABLET | ORAL | Status: DC | PRN

## 2017-09-14 NOTE — Discharge Summary (Signed)
Obstetric Discharge Summary   Patient ID: Brooke Rush MRN: [id number] DOB/AGE: [birth year] 42 y.o.   Date of Admission: 09/14/2017  Date of Discharge: 09/14/17  Admitting Diagnosis: IUP  at [redacted]w[redacted]d  Secondary Diagnosis:LOF prior to arrival with neg Ferning, neg Nitrazine, Neg Rom PLus Mode of Delivery: N/A     Discharge Diagnosis: IUP at 34 4/7 weeks, A2GDM (has not started Insulin as she is waiting for [facility] to call her)   Intrapartum Procedures: N/A   Post partum procedures: N/A  Complications: Pt presented with LOF and no amniotic fluid noted in the vaginal area, no fluid with cough, neg Nitrazine, Neg Ferning and Neg [place] Terry is a [id number] who had a gush of fluid this pm and thought her membranes had ruptured. No PPROM was identified.    Labs: CBC Latest Ref Rng & Units 05/21/2015 05/13/2015 07/18/2014  WBC 3.6 - 11.0 K/uL 8.9 8.8 8.3  Hemoglobin 12.0 - 16.0 g/dL 14.2 15.1(H) 15.2(H)  Hematocrit 35.0 - 47.0 % 43.5 46.2(H) 45.5  Platelets 150 - 440 K/uL 268 292.0 331.0   PENDING  Physical exam:  Blood pressure 124/68, pulse (!) 106, temperature 98.5 F (36.9 C), temperature source Axillary, resp. rate 18, last menstrual period 01/15/2017. General: alert and no distress Heart:S1S2, RRR, No M/R/G Lungs: cTA bilat, no W/R/R Abdomen: Gravid Extremities: No evidence of DVT seen on physical exam. No lower extremity edema.  Discharge Instructions: Per After Visit Summary. Activity: Advance as tolerated.  Diet: Regular Medications:PNV, Start on Insulin as soon as you get the [facility] class  Outpatient follow up: FU next week at [facility] and also at [facility] Postpartum contraception: unsure   Discharged Condition: Stable   Discharged: [place], MSN, CNM, [place] Certified Nurse Midwife [facility] [place], [place] 09/14/2017

## 2017-09-14 NOTE — OB Triage Note (Signed)
Patient came in from home after reporting gross amount gush of fluid around 1800 tonight. Patient states that leakage continued after initial gush of fluids. Reported that "pants were soaked front and back". Denies vaginal bleeding. Reports positive FM. Reports to occasional contractions that "take my breath away" since 09/06/2017, however, no increase in intensity since possible rupture. RN nitrozen patient, and nitrozen appeared negative. Will continue to assess.

## 2017-09-14 NOTE — Discharge Instructions (Signed)
Premature Rupture and Preterm Premature Rupture of Membranes A sac made up of membranes surrounds your baby in the womb (uterus). Rupture of membranes is when this sac breaks open. This is also known as your "water breaking." When this sac breaks before labor starts, it is called premature rupture of membranes (PROM). If this happens before 37 weeks of being pregnant, it is called preterm premature rupture of membranes (PPROM). PPROM is serious. It needs medical care right away. What increases the risk of PPROM? PPROM is more likely to happen in women who:  Have an infection.  Have had PPROM before.  Have a cervix that is short.  Have bleeding during the second or third trimester.  Have a low BMI. This is a measure of body fat.  Smoke.  Use drugs.  Have a low socioeconomic status.  What problems can be caused by PROM and PPROM? This condition creates health dangers for the mother and the baby. These include:  Giving birth to the baby too early (prematurely).  Getting a serious infection of the placenta (chorioamnionitis).  Having the placenta detach from the uterus early (placental abruption).  Squeezing of the umbilical cord.  Getting a serious infection after delivery.  What are the signs of PROM and PPROM?  A sudden gush of fluid from the vagina.  A slow leak of fluid from the vagina.  Your underwear is wet. What should I do if I think my water broke? Call your doctor right away. You will need to go to the hospital to get checked right away. What happens if I am told that I have PROM or PPROM? You will have tests done at the hospital.  If you have PROM, you may be given medicine to start labor (be induced). This may be done if you are not having contractions during the 24 hours after your water broke.  If you have PPROM and are not having contractions, you may be given medicine to start labor. It will depend on how far along you are in your pregnancy.  If you have  PPROM:  You and your baby will be watched closely to see if you have infections or other problems.  You may be given: ? An antibiotic medicine. This can stop an infection from starting. ? A steroid medicine. This can help your baby's lungs develop faster. ? A medicine to help prevent cerebral palsy in your baby. ? A medicine to stop early labor (preterm labor).  You may be told to stay in bed except to use the bathroom (bed rest).  You may be given medicine to start labor. This may be done if there are problems with you or the baby.  Your treatment will depend on many factors. Contact a doctor if:  Your water breaks and you are not having contractions. Get help right away if:  Your water breaks before you are [redacted] weeks pregnant. Summary  When your water breaks before labor starts, it is called premature rupture of membranes (PROM).  When PROM happens before 37 weeks of pregnancy, it is called preterm premature rupture of membranes (PPROM).  If you are not having contractions, your labor may be started for you. This information is not intended to replace advice given to you by your health care provider. Make sure you discuss any questions you have with your health care provider. Document Released: 08/19/2008 Document Revised: 02/11/2016 Document Reviewed: 02/11/2016 Elsevier Interactive Patient Education  2017 [employer].

## 2017-09-14 NOTE — Progress Notes (Addendum)
Brooke Rush is a 42 y.o. female. She is at [redacted]w[redacted]d gestation. Patient's last menstrual period was 01/15/2017. Estimated Date of Delivery: 10/22/17  Prenatal care site: [facility]    Chief complaint: Leaking fluid down my legs today with a puddle on the ground and on top of my flipflops. Since then, continued leaking and my pants are soaked.   Location: Vaginal  Onset/timing:1 hour agoDuration: Quality: Large amt of clear fluid Severity:no UC's, No VB or decreased FM. Aggravating or alleviating conditions: Associated signs/symptoms: Context: unsure if this could have been her bladder S: Resting comfortably. no CTX, no VB.no LOF,  Active fetal movement.   Maternal Medical History:   Past Medical History:  Diagnosis Date  . Arthritis    in her neck  . Constipation    2/2 pain medication  . Fibromyalgia    Diagnosed by Dr. Derrel Nip  . GERD (gastroesophageal reflux disease)   . Kidney stones   . Migraines   A2GDM but, has not started on Insulin due to waiting for Lifestyles class. Pt also had 1 week of glucose with many elevated sugars.   Past Surgical History:  Procedure Laterality Date  . CARPAL TUNNEL RELEASE Bilateral 1998  . CHOLECYSTECTOMY  2011  . ETHMOIDECTOMY Bilateral 12/02/2015   Procedure: LEFT TOTAL ETHMOIDECTOMY  RIGHT  REVISION TOTAL ETHMOIDECTOMY;  Surgeon: Carloyn Manner, MD;  Location: [place];  Service: ENT;  Laterality: Bilateral;  . FRONTAL SINUS EXPLORATION Bilateral 12/02/2015   Procedure: FRONTAL SINUSOTOMY;  Surgeon: Carloyn Manner, MD;  Location: [place];  Service: ENT;  Laterality: Bilateral;  . HEMORROIDECTOMY     Dr. Tamala Julian  . IMAGE GUIDED SINUS SURGERY N/A 12/02/2015   Procedure: IMAGE GUIDED SINUS SURGERY;  Surgeon: Carloyn Manner, MD;  Location: [place];  Service: ENT;  Laterality: N/A;  GAVE DISK TO CECE 6/22 DEE GEL [place]  . LEEP    . MAXILLARY ANTROSTOMY Left 12/02/2015    Procedure: MAXILLARY ANTROSTOMY;  Surgeon: Carloyn Manner, MD;  Location: [place];  Service: ENT;  Laterality: Left;  . NASAL SEPTUM SURGERY  2011  . NASAL SINUS SURGERY  2011   Dr. Pryor Ochoa  . REMOVAL OF EAR TUBE Bilateral 12/02/2015   Procedure: Bilateral Tube removal with Gel Film Myringoplasty;  Surgeon: Carloyn Manner, MD;  Location: [place];  Service: ENT;  Laterality: Bilateral;  . TONSILLECTOMY AND ADENOIDECTOMY  2013    No Known Allergies  Prior to Admission medications   Medication Sig Start Date End Date Taking? Authorizing Provider  acetaminophen (TYLENOL) 500 MG tablet Take by mouth.    [provider]  albuterol (PROVENTIL HFA) 108 (90 Base) MCG/ACT inhaler Inhale into the lungs. 03/10/17 03/10/18  [provider]  aspirin EC 81 MG tablet Take by mouth.    [provider]  cetirizine (ZYRTEC) 10 MG tablet Take 10 mg by mouth daily.    [provider]  cholecalciferol (VITAMIN D) 1000 UNITS tablet Take 2,000 Units by mouth daily. Reported on 11/30/2015    [provider]  [place] suspension  07/26/17   [provider]  cycloSPORINE (RESTASIS) 0.05 % ophthalmic emulsion 1 drop 2 (two) times daily.    [provider]  docusate sodium (COLACE) 100 MG capsule Take 100 mg 2 (two) times daily by mouth.    [provider]  fluticasone (FLONASE) 50 MCG/ACT nasal spray Place 2 sprays into both nostrils daily. 06/22/15   Doss,  Velora Heckler, RN  ondansetron [facility]) 8 MG tablet Take by mouth. 04/21/17   [provider]  Prenatal Vit-Fe Fumarate-FA (PRENATAL MULTIVITAMIN) TABS tablet Take 1 tablet daily at 12 noon by mouth.    [provider]  PROAIR HFA 108 [phone] Base) MCG/ACT inhaler  03/10/17   [provider]  Pseudoephedrine HCl (SUDAFED PO) Take by mouth.    [provider]  ranitidine (ZANTAC) 150 MG capsule Take by mouth.    [provider]   valACYclovir (VALTREX) 1000 MG tablet TAKE 1 TABLET TWICE A DAY Patient not taking: Reported on 06/22/2017 02/16/15   Rubbie Battiest, RN     Social History: She  reports that she quit smoking about 14 months ago. She has a 21.00 pack-year smoking history. She has never used smokeless tobacco. She reports that she does not drink alcohol or use drugs.  Family History: family history includes Alcohol abuse in her father; Arthritis in her mother; Heart disease in her father, maternal grandfather, and maternal grandmother; Hyperlipidemia in her father and maternal grandfather; Hypertension in her maternal grandfather; Kidney disease in her maternal grandmother; Obesity in her mother.  Review of Systems: A full review of systems was performed and negative except as noted in the HPI.     O:  LMP 01/15/2017  No results found for this or any previous visit (from the past 48 hour(s)).   Constitutional: NAD, AAOx3  HE/ENT: extraocular movements grossly intact, moist mucous membranes CV: RRR PULM: nl respiratory effort, CTABL     Abd: gravid, non-tender, non-distended, soft      Ext: Non-tender, Nonedmeatous   Psych: mood appropriate, speech normal Pelvic:FT/long/vtxballotable   NST: reactive  Baseline: 150 Variability: moderate Accelerations present x >2 Decelerations absent Time 23mins  Nitrazine neg per nursing. Spec exam done in lithotomy position. No fluid seen on perineum. Spec inserted and no fluid noted. White vag dc present. No fluid with cough from cx. Neg Nitrazine, Neg Ferning and ROM Plus sent.   A/P: 42 y.o. [redacted]w[redacted]d here for antenatal surveillance and triage for PPROM  Labor: not present.   Fetal Wellbeing: Reassuring Cat 1 tracing.  Reactive NST   ROM Plus sent  Will get HgbA1C & glucose  HgbA1C pending x 24 hours and glucose 83  -----  Danford Bad, MSN, CNM, [place] Certified Nurse Midwife [facility]

## 2017-09-28 LAB — OB RESULTS CONSOLE GC/CHLAMYDIA
Chlamydia: NEGATIVE
GC PROBE AMP, GENITAL: NEGATIVE

## 2017-09-28 LAB — OB RESULTS CONSOLE GBS: STREP GROUP B AG: NEGATIVE

## 2017-09-30 ENCOUNTER — Observation Stay
Admission: EM | Admit: 2017-09-30 | Discharge: 2017-09-30 | Disposition: A | Discharge status: Home / Self Care | Payer: BC Managed Care – PPO | Attending: Obstetrics and Gynecology | Admitting: Obstetrics and Gynecology

## 2017-09-30 ENCOUNTER — Encounter: Payer: Self-pay | Admitting: Obstetrics and Gynecology

## 2017-09-30 DIAGNOSIS — O26893 Other specified pregnancy related conditions, third trimester: Secondary | ICD-10-CM | POA: Insufficient documentation

## 2017-09-30 DIAGNOSIS — Z3A36 36 weeks gestation of pregnancy: Secondary | ICD-10-CM | POA: Diagnosis not present

## 2017-09-30 DIAGNOSIS — Z3490 Encounter for supervision of normal pregnancy, unspecified, unspecified trimester: Secondary | ICD-10-CM

## 2017-09-30 DIAGNOSIS — Z87891 Personal history of nicotine dependence: Secondary | ICD-10-CM | POA: Insufficient documentation

## 2017-09-30 DIAGNOSIS — Z7982 Long term (current) use of aspirin: Secondary | ICD-10-CM | POA: Diagnosis not present

## 2017-09-30 DIAGNOSIS — K219 Gastro-esophageal reflux disease without esophagitis: Secondary | ICD-10-CM | POA: Diagnosis not present

## 2017-09-30 DIAGNOSIS — M797 Fibromyalgia: Secondary | ICD-10-CM | POA: Insufficient documentation

## 2017-09-30 DIAGNOSIS — Z794 Long term (current) use of insulin: Secondary | ICD-10-CM | POA: Diagnosis not present

## 2017-09-30 DIAGNOSIS — Z87442 Personal history of urinary calculi: Secondary | ICD-10-CM | POA: Diagnosis not present

## 2017-09-30 DIAGNOSIS — O99613 Diseases of the digestive system complicating pregnancy, third trimester: Secondary | ICD-10-CM | POA: Diagnosis not present

## 2017-09-30 DIAGNOSIS — O24414 Gestational diabetes mellitus in pregnancy, insulin controlled: Secondary | ICD-10-CM | POA: Insufficient documentation

## 2017-09-30 DIAGNOSIS — Z79899 Other long term (current) drug therapy: Secondary | ICD-10-CM | POA: Diagnosis not present

## 2017-09-30 LAB — URINALYSIS, ROUTINE W REFLEX MICROSCOPIC
Bilirubin Urine: NEGATIVE
Glucose, UA: NEGATIVE mg/dL
HGB URINE DIPSTICK: NEGATIVE
KETONES UR: NEGATIVE mg/dL
LEUKOCYTES UA: NEGATIVE
Nitrite: NEGATIVE
Protein, ur: NEGATIVE mg/dL
Specific Gravity, Urine: 1.002 — ABNORMAL LOW (ref 1.005–1.030)
pH: 6 (ref 5.0–8.0)

## 2017-09-30 LAB — GLUCOSE, CAPILLARY: GLUCOSE-CAPILLARY: 133 mg/dL — AB (ref 65–99)

## 2017-09-30 MED ORDER — PROMETHAZINE HCL 25 MG PO TABS
25.0000 mg | ORAL_TABLET | Freq: Once | ORAL | Status: AC
  Administered 2017-09-30: 25 mg via ORAL
  Filled 2017-09-30: qty 1

## 2017-09-30 MED ORDER — TERBUTALINE SULFATE 1 MG/ML IJ SOLN
INTRAMUSCULAR | Status: AC
  Filled 2017-09-30: qty 1

## 2017-09-30 MED ORDER — HYDROCODONE-ACETAMINOPHEN 5-325 MG PO TABS
1.0000 | ORAL_TABLET | Freq: Once | ORAL | Status: AC
  Administered 2017-09-30: 1 via ORAL
  Filled 2017-09-30: qty 1

## 2017-09-30 MED ORDER — TERBUTALINE SULFATE 1 MG/ML IJ SOLN
0.2500 mg | INTRAMUSCULAR | Status: DC | PRN
  Administered 2017-09-30: 0.25 mg via SUBCUTANEOUS

## 2017-09-30 NOTE — Discharge Summary (Signed)
Brooke Rush is a 42 y.o. female. She is at [redacted]w[redacted]d gestation. Patient's last menstrual period was 01/15/2017. Estimated Date of Delivery: 10/22/17  Prenatal care site: [facility]   Chief complaint: Contractions  Location: uterus Onset/timing: started around 1300 09/29/17 Duration: intermittent, every 3-5 minutes Quality: contractions Severity: mild to moderate Aggravating or alleviating conditions: none Associated signs/symptoms: none Context: Brooke Rush reports onset of contractions at lunchtime on 09/29/17 that were every 3-5 minutes upon initial arrival to L&D triage.  S: Resting comfortably.   She reports:  -active fetal movement -no leakage of fluid  -no vaginal bleeding   Maternal Medical History:   Past Medical History:  Diagnosis Date  . Arthritis    in her neck  . Constipation    2/2 pain medication  . Fibromyalgia    Diagnosed by Dr. Derrel Nip  . GERD (gastroesophageal reflux disease)   . Kidney stones   . Migraines     Past Surgical History:  Procedure Laterality Date  . CARPAL TUNNEL RELEASE Bilateral 1998  . CHOLECYSTECTOMY  2011  . ETHMOIDECTOMY Bilateral 12/02/2015   Procedure: LEFT TOTAL ETHMOIDECTOMY  RIGHT  REVISION TOTAL ETHMOIDECTOMY;  Surgeon: Carloyn Manner, MD;  Location: [place];  Service: ENT;  Laterality: Bilateral;  . FRONTAL SINUS EXPLORATION Bilateral 12/02/2015   Procedure: FRONTAL SINUSOTOMY;  Surgeon: Carloyn Manner, MD;  Location: [place];  Service: ENT;  Laterality: Bilateral;  . HEMORROIDECTOMY     Dr. Tamala Julian  . IMAGE GUIDED SINUS SURGERY N/A 12/02/2015   Procedure: IMAGE GUIDED SINUS SURGERY;  Surgeon: Carloyn Manner, MD;  Location: [place];  Service: ENT;  Laterality: N/A;  GAVE DISK TO CE[facility] 6/22 DEE GEL [place]  . LEEP    . MAXILLARY ANTROSTOMY Left 12/02/2015   Procedure: MAXILLARY ANTROSTOMY;  Surgeon: Carloyn Manner, MD;  Location: [place];  Service:  ENT;  Laterality: Left;  . NASAL SEPTUM SURGERY  2011  . NASAL SINUS SURGERY  2011   Dr. Pryor Ochoa  . REMOVAL OF EAR TUBE Bilateral 12/02/2015   Procedure: Bilateral Tube removal with Gel Film Myringoplasty;  Surgeon: Carloyn Manner, MD;  Location: [place];  Service: ENT;  Laterality: Bilateral;  . TONSILLECTOMY AND ADENOIDECTOMY  2013    No Known Allergies  Prior to Admission medications   Medication Sig Start Date End Date Taking? Authorizing Provider  aspirin EC 81 MG tablet Take by mouth.   Yes [provider]  cetirizine (ZYRTEC) 10 MG tablet Take 10 mg by mouth daily.   Yes [provider]  insulin NPH Human (HUMULIN N,NOVOLIN N) 100 UNIT/ML injection Inject into the skin.   Yes [provider]  insulin regular (NOVOLIN R,HUMULIN R) 100 units/mL injection Inject into the skin 3 (three) times daily before meals.   Yes [provider]  valACYclovir (VALTREX) 1000 MG tablet TAKE 1 TABLET TWICE A DAY 02/16/15  Yes Doss, Velora Heckler, RN  acetaminophen (TYLENOL) 500 MG tablet Take by mouth.    [provider]  albuterol (PROVENTIL HFA) 108 (90 Base) MCG/ACT inhaler Inhale into the lungs. 03/10/17 03/10/18  [provider]  cholecalciferol (VITAMIN D) 1000 UNITS tablet Take 2,000 Units by mouth daily. Reported on 11/30/2015    [provider]  [place] suspension  07/26/17   [provider]  cycloSPORINE (RESTASIS) 0.05 % ophthalmic emulsion 1 drop 2 (two) times daily.    [provider]  docusate sodium (COLACE) 100 MG capsule  Take 100 mg 2 (two) times daily by mouth.    [provider]  fluticasone (FLONASE) 50 MCG/ACT nasal spray Place 2 sprays into both nostrils daily. 06/22/15   Rubbie Battiest, RN  ondansetron (ZOFRAN) 8 MG tablet Take by mouth. 04/21/17   [provider]  Prenatal Vit-Fe Fumarate-FA (PRENATAL MULTIVITAMIN) TABS tablet Take 1 tablet daily at 12 noon by mouth.     [provider]  PROAIR HFA 108 [phone] Base) MCG/ACT inhaler  03/10/17   [provider]  Pseudoephedrine HCl (SUDAFED PO) Take by mouth.    [provider]  ranitidine (ZANTAC) 150 MG capsule Take by mouth.    [provider]     Social History: She  reports that she quit smoking about 14 months ago. She has a 21.00 pack-year smoking history. She has never used smokeless tobacco. She reports that she does not drink alcohol or use drugs.  Family History: family history includes Alcohol abuse in her father; Arthritis in her mother; Heart disease in her father, maternal grandfather, and maternal grandmother; Hyperlipidemia in her father and maternal grandfather; Hypertension in her maternal grandfather; Kidney disease in her maternal grandmother; Obesity in her mother.   Review of Systems: A full review of systems was performed and negative except as noted in the HPI.     O:  BP (!) 114/56   Pulse 97   Temp 97.9 F (36.6 C) (Oral)   Resp 16   Ht 5\' 4"  (1.626 m)   Wt 106.6 kg (235 lb)   LMP 01/15/2017   BMI 40.34 kg/m  Results for orders placed or performed during the hospital encounter of 09/30/17 (from the past 48 hour(s))  Glucose, capillary   Collection Time: 09/30/17 12:52 AM  Result Value Ref Range   Glucose-Capillary 133 (H) 65 - 99 mg/dL  Urinalysis, Routine w reflex microscopic   Collection Time: 09/30/17  1:55 AM  Result Value Ref Range   Color, Urine STRAW (A) YELLOW   APPearance CLEAR (A) CLEAR   Specific Gravity, Urine 1.002 (L) 1.005 - 1.030   pH 6.0 5.0 - 8.0   Glucose, UA NEGATIVE NEGATIVE mg/dL   Hgb urine dipstick NEGATIVE NEGATIVE   Bilirubin Urine NEGATIVE NEGATIVE   Ketones, ur NEGATIVE NEGATIVE mg/dL   Protein, ur NEGATIVE NEGATIVE mg/dL   Nitrite NEGATIVE NEGATIVE   Leukocytes, UA NEGATIVE NEGATIVE     Constitutional: NAD, AAOx3  HE/ENT: extraocular movements grossly intact, moist mucous membranes CV: RRR PULM: nl  respiratory effort, CTABL     Abd: gravid, non-tender, non-distended, soft      Ext: Non-tender, Nonedmeatous   Psych: mood appropriate, speech normal Pelvic deferred  NST/monitoring:  Baseline: 130bpm Variability: moderate Accelerations: present x >2 Decelerations: absent Toco: occasional contraction Time: 8 hours   A/P: 42 y.o. [redacted]w[redacted]d here for antenatal surveillance for contractions.   Contractions: stopped with one dose of 0.25mg  terbutaline given per overnight provider  Labor: currently not present  Fetal Wellbeing: Reassuring Cat 1 tracing  D/c home stable, precautions reviewed, follow-up as scheduled at 0930 10/02/17 with CNM Hassan Buckler.    Lisette Grinder 09/30/2017 8:35 AM  ----- Lisette Grinder, CNM Certified Nurse Midwife [facility], Department of [facility]

## 2017-09-30 NOTE — Progress Notes (Signed)
Pt transferred from OBS4 to OBS3.

## 2017-09-30 NOTE — OB Triage Note (Signed)
Pt present to L&D with c/o contractions, 10/10 pain, every 3-5 mins. Pt denies leaking of fluid, vaginal bleeding, or decreased fetal movement. Toco and EFM applied and explained. Will monitor closely.

## 2017-09-30 NOTE — Discharge Instructions (Signed)
Instructions for Patients & Families  Term Labor   What is Labor?   Labor is when you have contractions and your cervix opening to the baby) opens up or dilates.   Labor pains or contractions feel like tightening or pain of your uterus or belly.   True labor pains will get stronger and closer together.   When should I come to the hospital?   When the labor pains are every 5 minutes or closer for 1-2 hour it is time to come to the hospital.  Count contractions from when you first feel a contraction to the start of the next contraction.   If your baby is feet or butt down (breech) or you have had a C-Section before, come to the hospital when contractions are 10 minutes apart or less for 1 hour.   When your bag of water breaks (even if you are not having contractions)   How do I know if my water bag is broken?   Most women have a gush of fluid. If you are leaking water from the vagina all the time it can also mean the water bag is broken.   If you think your bag of water is broken, but you are not sure, you need to come to the hospital. Simple tests can be done by your provider to determine if the bag of water is broken.    If you have any further questions, please contact your provider (Doctor, nurse practitioner or nurse midwife).   Call your provider or come back to the hospital if you have any of the following:   4 or more contractions in one hour before 37 weeks of pregnancy   Leakage of fluid or think your water breaks   Bright red bleeding from your vagina   Fever greater than 100.4   Decrease in your babys normal movements   If you feel a decrease in your babys normal movements and are greater than 28 weeks you should do kick counts. You should count your babys movements at least once a day for 2 hours while lying down on your side. You should feel at least 10 movements in this time period.

## 2017-10-02 ENCOUNTER — Observation Stay
Admission: EM | Admit: 2017-10-02 | Discharge: 2017-10-02 | Disposition: A | Discharge status: Home / Self Care | Payer: BC Managed Care – PPO | Attending: Obstetrics & Gynecology | Admitting: Obstetrics & Gynecology

## 2017-10-02 ENCOUNTER — Other Ambulatory Visit: Payer: Self-pay

## 2017-10-02 ENCOUNTER — Other Ambulatory Visit: Payer: Self-pay | Admitting: Obstetrics and Gynecology

## 2017-10-02 DIAGNOSIS — M1990 Unspecified osteoarthritis, unspecified site: Secondary | ICD-10-CM | POA: Insufficient documentation

## 2017-10-02 DIAGNOSIS — O99213 Obesity complicating pregnancy, third trimester: Secondary | ICD-10-CM | POA: Diagnosis not present

## 2017-10-02 DIAGNOSIS — Z79899 Other long term (current) drug therapy: Secondary | ICD-10-CM | POA: Diagnosis not present

## 2017-10-02 DIAGNOSIS — Z3A37 37 weeks gestation of pregnancy: Secondary | ICD-10-CM | POA: Insufficient documentation

## 2017-10-02 DIAGNOSIS — M797 Fibromyalgia: Secondary | ICD-10-CM | POA: Insufficient documentation

## 2017-10-02 DIAGNOSIS — O09523 Supervision of elderly multigravida, third trimester: Secondary | ICD-10-CM | POA: Insufficient documentation

## 2017-10-02 DIAGNOSIS — O24414 Gestational diabetes mellitus in pregnancy, insulin controlled: Secondary | ICD-10-CM | POA: Insufficient documentation

## 2017-10-02 DIAGNOSIS — Z3689 Encounter for other specified antenatal screening: Secondary | ICD-10-CM

## 2017-10-02 DIAGNOSIS — O351XX0 Maternal care for (suspected) chromosomal abnormality in fetus, not applicable or unspecified: Principal | ICD-10-CM | POA: Insufficient documentation

## 2017-10-02 DIAGNOSIS — K219 Gastro-esophageal reflux disease without esophagitis: Secondary | ICD-10-CM | POA: Diagnosis not present

## 2017-10-02 DIAGNOSIS — O368330 Maternal care for abnormalities of the fetal heart rate or rhythm, third trimester, not applicable or unspecified: Secondary | ICD-10-CM | POA: Diagnosis present

## 2017-10-02 DIAGNOSIS — Z87891 Personal history of nicotine dependence: Secondary | ICD-10-CM | POA: Insufficient documentation

## 2017-10-02 DIAGNOSIS — Z7982 Long term (current) use of aspirin: Secondary | ICD-10-CM | POA: Insufficient documentation

## 2017-10-02 LAB — OB RESULTS CONSOLE HIV ANTIBODY (ROUTINE TESTING): HIV: NONREACTIVE

## 2017-10-02 LAB — OB RESULTS CONSOLE RPR: RPR: NONREACTIVE

## 2017-10-02 NOTE — Discharge Instructions (Signed)
Induction for Oct 16, 2017 at 08:00am

## 2017-10-02 NOTE — OB Triage Note (Signed)
Pt sent over from doctors office for NST

## 2017-10-02 NOTE — Discharge Summary (Signed)
Brooke Rush is a 42 y.o. female. She is at [redacted]w[redacted]d gestation. Patient's last menstrual period was 01/15/2017. Estimated Date of Delivery: 10/22/17   Prenatal care site: [facility]   Chief Complaint: high risk pregnancy, need for antepartum surveillance 1. GDMA2 2. Advanced maternal age 42. Obesity, BMI 40.3 4. 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 Occasional UCs noted, no VB.no LOF,  Active fetal movement.    Maternal Medical History:   Past Medical History:  Diagnosis Date  . Arthritis    in her neck  . Constipation    2/2 pain medication  . Fibromyalgia    Diagnosed by Dr. Derrel Nip  . GERD (gastroesophageal reflux disease)   . Kidney stones   . Migraines     Past Surgical History:  Procedure Laterality Date  . CARPAL TUNNEL RELEASE Bilateral 1998  . CHOLECYSTECTOMY  2011  . ETHMOIDECTOMY Bilateral 12/02/2015   Procedure: LEFT TOTAL ETHMOIDECTOMY  RIGHT  REVISION TOTAL ETHMOIDECTOMY;  Surgeon: Carloyn Manner, MD;  Location: [place];  Service: ENT;  Laterality: Bilateral;  . FRONTAL SINUS EXPLORATION Bilateral 12/02/2015   Procedure: FRONTAL SINUSOTOMY;  Surgeon: Carloyn Manner, MD;  Location: [place];  Service: ENT;  Laterality: Bilateral;  . HEMORROIDECTOMY     Dr. Tamala Julian  . IMAGE GUIDED SINUS SURGERY N/A 12/02/2015   Procedure: IMAGE GUIDED SINUS SURGERY;  Surgeon: Carloyn Manner, MD;  Location: [place];  Service: ENT;  Laterality: N/A;  GAVE DISK TO CECE 6/22 DEE GEL [place]  . LEEP    . MAXILLARY ANTROSTOMY Left 12/02/2015   Procedure: MAXILLARY ANTROSTOMY;  Surgeon: Carloyn Manner, MD;  Location: [place];  Service: ENT;  Laterality: Left;  . NASAL SEPTUM SURGERY  2011  . NASAL SINUS SURGERY  2011   Dr. Pryor Ochoa  . REMOVAL OF EAR TUBE Bilateral 12/02/2015   Procedure: Bilateral Tube removal with Gel Film Myringoplasty;  Surgeon: Carloyn Manner, MD;  Location: [place];  Service: ENT;  Laterality: Bilateral;  . TONSILLECTOMY AND ADENOIDECTOMY  2013    No Known Allergies  Prior to Admission medications   Medication Sig Start Date End Date Taking? Authorizing Provider  acetaminophen (TYLENOL) 500 MG tablet Take by mouth.    [provider]  albuterol (PROVENTIL HFA) 108 (90 Base) MCG/ACT inhaler Inhale into the lungs. 03/10/17 03/10/18  [provider]  aspirin EC 81 MG tablet Take by mouth.    [provider]  cetirizine (ZYRTEC) 10 MG tablet Take 10 mg by mouth daily.    [provider]  cholecalciferol (VITAMIN D) 1000 UNITS tablet Take 2,000 Units by mouth daily. Reported on 11/30/2015    [provider]  [place] suspension  07/26/17   [provider]  cycloSPORINE (RESTASIS) 0.05 % ophthalmic emulsion 1 drop 2 (two) times daily.    [provider]  docusate sodium (COLACE) 100 MG capsule Take 100 mg 2 (two) times daily by mouth.    [provider]  fluticasone (FLONASE) 50 MCG/ACT nasal spray Place 2 sprays into both nostrils daily. 06/22/15   Rubbie Battiest, RN  insulin NPH Human (HUMULIN N,NOVOLIN N) 100 UNIT/ML injection Inject into the skin.    [provider]  insulin regular (NOVOLIN R,HUMULIN R) 100 units/mL injection Inject into the skin 3 (three) times daily before meals.    [provider]  ondansetron (ZOFRAN) 8 MG tablet Take by mouth. 04/21/17   [provider]  Prenatal Vit-Fe Fumarate-FA (PRENATAL MULTIVITAMIN) TABS tablet Take 1 tablet daily at 12 noon by mouth.    [provider]  PROAIR HFA 108 [phone] Base) MCG/ACT inhaler  03/10/17   [provider]  Pseudoephedrine HCl (SUDAFED PO) Take by mouth.    [provider]  ranitidine (ZANTAC) 150 MG capsule Take by mouth.    [provider]  valACYclovir (VALTREX) 1000 MG tablet TAKE 1 TABLET TWICE A DAY 02/16/15   Rubbie Battiest, RN     Social History:  She  reports that she quit smoking about 14 months ago. She has a 21.00 pack-year smoking history. She has never used smokeless tobacco. She reports that she does not drink alcohol or use drugs.  Family History: family history includes Alcohol abuse in her father; Arthritis in her mother; Heart disease in her father, maternal grandfather, and maternal grandmother; Hyperlipidemia in her father and maternal grandfather; Hypertension in her maternal grandfather; Kidney disease in her maternal grandmother; Obesity in her mother.   Review of Systems: A full review of systems was performed and negative except as noted in the HPI.     O:  BP 117/63 (BP Location: Right Arm)   Pulse (!) 104   Temp 98.4 F (36.9 C) (Oral)   Resp 16   LMP 01/15/2017  No results found for this or any previous visit (from the past 48 hour(s)).   Constitutional: NAD, AAOx3  HE/ENT: extraocular movements grossly intact, moist mucous membranes CV: RRR PULM: nl respiratory effort, CTABL     Abd: gravid, non-tender, non-distended, soft      Ext: Non-tender, Nonedmeatous   Psych: mood appropriate, speech normal Pelvic: SVE 1/50/-2, soft/mid  Baseline: 130 Variability: moderate Accelerations present x >2 Decelerations absent Toco: occasional UC Time 53mins    A/P: 42 y.o. [redacted]w[redacted]d with high risk pregnancy and antepartum surveillance.   Labor: not present.   Fetal Wellbeing: Reassuring Cat 1 tracing.  Reactive NST   D/c home stable, precautions reviewed, follow-up as scheduled.   39wk IOL scheduled on 10/16/17 at 0800  ----- May Manrique, [place] A, CNM 10/02/2017  12:33 PM

## 2017-10-05 ENCOUNTER — Observation Stay
Admission: AD | Admit: 2017-10-05 | Discharge: 2017-10-05 | Disposition: A | Discharge status: Home / Self Care | Payer: BC Managed Care – PPO | Source: Ambulatory Visit | Attending: Obstetrics & Gynecology | Admitting: Obstetrics & Gynecology

## 2017-10-05 DIAGNOSIS — O9989 Other specified diseases and conditions complicating pregnancy, childbirth and the puerperium: Secondary | ICD-10-CM | POA: Insufficient documentation

## 2017-10-05 DIAGNOSIS — Z79899 Other long term (current) drug therapy: Secondary | ICD-10-CM | POA: Diagnosis not present

## 2017-10-05 DIAGNOSIS — Z8249 Family history of ischemic heart disease and other diseases of the circulatory system: Secondary | ICD-10-CM | POA: Insufficient documentation

## 2017-10-05 DIAGNOSIS — O09523 Supervision of elderly multigravida, third trimester: Secondary | ICD-10-CM | POA: Diagnosis not present

## 2017-10-05 DIAGNOSIS — Z87891 Personal history of nicotine dependence: Secondary | ICD-10-CM | POA: Insufficient documentation

## 2017-10-05 DIAGNOSIS — O24414 Gestational diabetes mellitus in pregnancy, insulin controlled: Principal | ICD-10-CM | POA: Insufficient documentation

## 2017-10-05 DIAGNOSIS — Z794 Long term (current) use of insulin: Secondary | ICD-10-CM | POA: Insufficient documentation

## 2017-10-05 DIAGNOSIS — M797 Fibromyalgia: Secondary | ICD-10-CM | POA: Insufficient documentation

## 2017-10-05 DIAGNOSIS — Z7982 Long term (current) use of aspirin: Secondary | ICD-10-CM | POA: Diagnosis not present

## 2017-10-05 DIAGNOSIS — Z3A37 37 weeks gestation of pregnancy: Secondary | ICD-10-CM | POA: Diagnosis not present

## 2017-10-05 DIAGNOSIS — Z87442 Personal history of urinary calculi: Secondary | ICD-10-CM | POA: Insufficient documentation

## 2017-10-05 NOTE — OB Triage Note (Addendum)
Pt sent over from [facility] office for NST. Pt denies VB, Headache, LOF, and states decreased FM. FHT 145 on monitor.

## 2017-10-05 NOTE — Discharge Summary (Signed)
Brooke Rush is a 42 y.o. female. She is at [redacted]w[redacted]d gestation. Patient's last menstrual period was 01/15/2017. Estimated Date of Delivery: 10/22/17   Prenatal care site: [facility]   Chief Complaint: high risk pregnancy, need for antepartum surveillance  S: Resting comfortably. no CTX, no VB.no LOF,  Active fetal movement.  Had non-reactive strip in office.   Gestational diabetes on insulin No complaints   Maternal Medical History:   Past Medical History:  Diagnosis Date  . Arthritis    in her neck  . Constipation    2/2 pain medication  . Fibromyalgia    Diagnosed by Dr. Derrel Nip  . GERD (gastroesophageal reflux disease)   . Kidney stones   . Migraines     Past Surgical History:  Procedure Laterality Date  . CARPAL TUNNEL RELEASE Bilateral 1998  . CHOLECYSTECTOMY  2011  . ETHMOIDECTOMY Bilateral 12/02/2015   Procedure: LEFT TOTAL ETHMOIDECTOMY  RIGHT  REVISION TOTAL ETHMOIDECTOMY;  Surgeon: Carloyn Manner, MD;  Location: [place];  Service: ENT;  Laterality: Bilateral;  . FRONTAL SINUS EXPLORATION Bilateral 12/02/2015   Procedure: FRONTAL SINUSOTOMY;  Surgeon: Carloyn Manner, MD;  Location: [place];  Service: ENT;  Laterality: Bilateral;  . HEMORROIDECTOMY     Dr. Tamala Julian  . IMAGE GUIDED SINUS SURGERY N/A 12/02/2015   Procedure: IMAGE GUIDED SINUS SURGERY;  Surgeon: Carloyn Manner, MD;  Location: [place];  Service: ENT;  Laterality: N/A;  GAVE DISK TO CECE 6/22 DEE GEL [place]  . LEEP    . MAXILLARY ANTROSTOMY Left 12/02/2015   Procedure: MAXILLARY ANTROSTOMY;  Surgeon: Carloyn Manner, MD;  Location: [place];  Service: ENT;  Laterality: Left;  . NASAL SEPTUM SURGERY  2011  . NASAL SINUS SURGERY  2011   Dr. Pryor Ochoa  . REMOVAL OF EAR TUBE Bilateral 12/02/2015   Procedure: Bilateral Tube removal with Gel Film Myringoplasty;  Surgeon: Carloyn Manner, MD;  Location: [place];   Service: ENT;  Laterality: Bilateral;  . TONSILLECTOMY AND ADENOIDECTOMY  2013    No Known Allergies  Prior to Admission medications   Medication Sig Start Date End Date Taking? Authorizing Provider  acetaminophen (TYLENOL) 500 MG tablet Take by mouth.   Yes [provider]  albuterol (PROVENTIL HFA) 108 (90 Base) MCG/ACT inhaler Inhale into the lungs. 03/10/17 03/10/18 Yes [provider]  aspirin EC 81 MG tablet Take by mouth.   Yes [provider]  cetirizine (ZYRTEC) 10 MG tablet Take 10 mg by mouth daily.   Yes [provider]  cholecalciferol (VITAMIN D) 1000 UNITS tablet Take 2,000 Units by mouth daily. Reported on 11/30/2015   Yes [provider]  cyclobenzaprine (FLEXERIL) 5 MG tablet Take 5 mg by mouth 3 (three) times daily as needed for muscle spasms.   Yes [provider]  cycloSPORINE (RESTASIS) 0.05 % ophthalmic emulsion 1 drop 2 (two) times daily.   Yes [provider]  docusate sodium (COLACE) 100 MG capsule Take 100 mg 2 (two) times daily by mouth.   Yes [provider]  fluticasone (FLONASE) 50 MCG/ACT nasal spray Place 2 sprays into both nostrils daily. 06/22/15  Yes Doss, Velora Heckler, RN  insulin NPH Human (HUMULIN N,NOVOLIN N) 100 UNIT/ML injection Inject into the skin.   Yes [provider]  insulin regular (NOVOLIN R,HUMULIN R) 100 units/mL injection Inject into the skin 3 (three) times daily before meals.   Yes [provider]  magnesium  oxide (MAG-OX) 400 MG tablet Take 400 mg by mouth daily.   Yes [provider]  ondansetron (ZOFRAN) 8 MG tablet Take by mouth. 04/21/17  Yes [provider]  Prenatal Vit-Fe Fumarate-FA (PRENATAL MULTIVITAMIN) TABS tablet Take 1 tablet daily at 12 noon by mouth.   Yes [provider]  PROAIR HFA 108 [phone] Base) MCG/ACT inhaler  03/10/17  Yes [provider]  ranitidine (ZANTAC) 150 MG capsule Take by mouth.   Yes  [provider]  valACYclovir (VALTREX) 1000 MG tablet TAKE 1 TABLET TWICE A DAY 02/16/15  Yes Doss, Velora Heckler, RN     Social History: She  reports that she quit smoking about 16 months ago. She has a 21.00 pack-year smoking history. She has never used smokeless tobacco. She reports that she does not drink alcohol or use drugs.  Family History: family history includes Alcohol abuse in her father; Arthritis in her mother; Heart disease in her father, maternal grandfather, and maternal grandmother; Hyperlipidemia in her father and maternal grandfather; Hypertension in her maternal grandfather; Kidney disease in her maternal grandmother; Obesity in her mother.   Review of Systems: A full review of systems was performed and negative except as noted in the HPI.     O:  BP 121/77 (BP Location: Left Arm)   Pulse (!) 101   Temp 98.1 F (36.7 C) (Oral)   Resp 16   Ht 5\' 4"  (1.626 m)   Wt 102.6 kg (226 lb 3.2 oz)   LMP 01/15/2017   BMI 38.83 kg/m  No results found for this or any previous visit (from the past 48 hour(s)).   Constitutional: NAD, AAOx3  HE/ENT: extraocular movements grossly intact, moist mucous membranes CV: RRR PULM: nl respiratory effort, CTABL     Abd: gravid, non-tender, non-distended, soft      Ext: Non-tender, Nonedmeatous   Psych: mood appropriate, speech normal Pelvic:deferred  Baseline: 130 Variability: moderate Accelerations present x >2 Decelerations absent Time 53mins    A/P: 42 y.o. [redacted]w[redacted]d with high risk pregnancy and antepartum surveillance.   Labor: not present.   Fetal Wellbeing: Reassuring Cat 1 tracing.  Reactive NST   D/c home stable, precautions reviewed, follow-up as scheduled.   ----- Larey Days, MD Attending Obstetrician and Gynecologist [facility], Department of [facility]

## 2017-10-10 ENCOUNTER — Encounter: Payer: Self-pay | Admitting: *Deleted

## 2017-10-10 ENCOUNTER — Observation Stay
Admission: EM | Admit: 2017-10-10 | Discharge: 2017-10-10 | Disposition: A | Discharge status: Home / Self Care | Payer: BC Managed Care – PPO | Attending: Certified Nurse Midwife | Admitting: Certified Nurse Midwife

## 2017-10-10 DIAGNOSIS — O09523 Supervision of elderly multigravida, third trimester: Secondary | ICD-10-CM | POA: Diagnosis not present

## 2017-10-10 DIAGNOSIS — O24414 Gestational diabetes mellitus in pregnancy, insulin controlled: Principal | ICD-10-CM | POA: Insufficient documentation

## 2017-10-10 DIAGNOSIS — O24419 Gestational diabetes mellitus in pregnancy, unspecified control: Secondary | ICD-10-CM | POA: Diagnosis present

## 2017-10-10 DIAGNOSIS — Z794 Long term (current) use of insulin: Secondary | ICD-10-CM | POA: Insufficient documentation

## 2017-10-10 DIAGNOSIS — Z3A38 38 weeks gestation of pregnancy: Secondary | ICD-10-CM | POA: Insufficient documentation

## 2017-10-10 NOTE — Discharge Summary (Signed)
Triage visit for NST   Brooke Rush is a 42 y.o. [id number]. She is at [redacted]w[redacted]d gestation. She presents for an NST after having a non-reactive NST in the office.  Indication: GDMA2  S: Resting comfortably. Feeling occasional contractions, no vaginal bleeding, and no leakage of fluid. Active fetal movement. No concerns.   O:  Resp 18   Ht 5\' 4"  (1.626 m)   Wt 103 kg (227 lb)   LMP 01/15/2017   BMI 38.96 kg/m  No results found for this or any previous visit (from the past 48 hour(s)).   Gen: NAD, AAOx3      Abd: FNTTP      Ext: Non-tender, Nonedmeatous Cervix: 1cm/60%/-3 vertex per TJS MD in the office    NST Baseline: 135bpm Variability: moderate Accelerations: 15x15 present x2 Decelerations: absent Time: 40mins  Interpretation: reactive NST, category 1 tracing  Toco: contractions q2-4 minutes, patient reports not feeling anything  A/P:  42 y.o. Q7Y1948 [redacted]w[redacted]d with GDMA2.    Reactive NST, with moderate variability and accelerations, no decels  Fetal Wellbeing: Reassuring  D/c home stable, precautions reviewed, follow-up as scheduled in the office for AFI and blood glucose review/insulin managment. Plan for NST 10/13/17 her on L&D after appointments in the office.   Induction scheduled for 10/16/17.    Lisette Grinder 10/10/2017 12:48 PM  ----- Lisette Grinder, CNM, WHNP-BC [facility], Department of [facility]

## 2017-10-13 ENCOUNTER — Observation Stay
Admission: EM | Admit: 2017-10-13 | Discharge: 2017-10-13 | Disposition: A | Discharge status: Home / Self Care | Payer: BC Managed Care – PPO | Attending: Obstetrics & Gynecology | Admitting: Obstetrics & Gynecology

## 2017-10-13 DIAGNOSIS — Z7982 Long term (current) use of aspirin: Secondary | ICD-10-CM | POA: Diagnosis not present

## 2017-10-13 DIAGNOSIS — Z7951 Long term (current) use of inhaled steroids: Secondary | ICD-10-CM | POA: Diagnosis not present

## 2017-10-13 DIAGNOSIS — Z79899 Other long term (current) drug therapy: Secondary | ICD-10-CM | POA: Insufficient documentation

## 2017-10-13 DIAGNOSIS — Z3A38 38 weeks gestation of pregnancy: Secondary | ICD-10-CM | POA: Insufficient documentation

## 2017-10-13 DIAGNOSIS — O9989 Other specified diseases and conditions complicating pregnancy, childbirth and the puerperium: Secondary | ICD-10-CM | POA: Insufficient documentation

## 2017-10-13 DIAGNOSIS — M797 Fibromyalgia: Secondary | ICD-10-CM | POA: Insufficient documentation

## 2017-10-13 DIAGNOSIS — Z87891 Personal history of nicotine dependence: Secondary | ICD-10-CM | POA: Insufficient documentation

## 2017-10-13 DIAGNOSIS — O99613 Diseases of the digestive system complicating pregnancy, third trimester: Secondary | ICD-10-CM | POA: Diagnosis not present

## 2017-10-13 DIAGNOSIS — O24414 Gestational diabetes mellitus in pregnancy, insulin controlled: Secondary | ICD-10-CM | POA: Diagnosis not present

## 2017-10-13 DIAGNOSIS — K219 Gastro-esophageal reflux disease without esophagitis: Secondary | ICD-10-CM | POA: Insufficient documentation

## 2017-10-13 NOTE — Discharge Planning (Deleted)
Brooke Rush is a 42 y.o. female. She is at [redacted]w[redacted]d gestation. Patient's last menstrual period was 01/15/2017. Estimated Date of Delivery: 10/22/17   Prenatal care site: [facility]   Chief Complaint: high risk pregnancy, need for antepartum surveillance  Gestational diabetes, on insulin  S: Resting comfortably. no CTX, no VB.no LOF,  Active fetal movement.    Maternal Medical History:   Past Medical History:  Diagnosis Date  . Arthritis    in her neck  . Constipation    2/2 pain medication  . Fibromyalgia    Diagnosed by Dr. Derrel Nip  . GERD (gastroesophageal reflux disease)   . Kidney stones   . Migraines     Past Surgical History:  Procedure Laterality Date  . CARPAL TUNNEL RELEASE Bilateral 1998  . CHOLECYSTECTOMY  2011  . ETHMOIDECTOMY Bilateral 12/02/2015   Procedure: LEFT TOTAL ETHMOIDECTOMY  RIGHT  REVISION TOTAL ETHMOIDECTOMY;  Surgeon: Carloyn Manner, MD;  Location: [place];  Service: ENT;  Laterality: Bilateral;  . FRONTAL SINUS EXPLORATION Bilateral 12/02/2015   Procedure: FRONTAL SINUSOTOMY;  Surgeon: Carloyn Manner, MD;  Location: [place];  Service: ENT;  Laterality: Bilateral;  . HEMORROIDECTOMY     Dr. Tamala Julian  . IMAGE GUIDED SINUS SURGERY N/A 12/02/2015   Procedure: IMAGE GUIDED SINUS SURGERY;  Surgeon: Carloyn Manner, MD;  Location: [place];  Service: ENT;  Laterality: N/A;  GAVE DISK TO CECE 6/22 DEE GEL [place]  . LEEP    . MAXILLARY ANTROSTOMY Left 12/02/2015   Procedure: MAXILLARY ANTROSTOMY;  Surgeon: Carloyn Manner, MD;  Location: [place];  Service: ENT;  Laterality: Left;  . NASAL SEPTUM SURGERY  2011  . NASAL SINUS SURGERY  2011   Dr. Pryor Ochoa  . REMOVAL OF EAR TUBE Bilateral 12/02/2015   Procedure: Bilateral Tube removal with Gel Film Myringoplasty;  Surgeon: Carloyn Manner, MD;  Location: [place];  Service: ENT;  Laterality: Bilateral;  . TONSILLECTOMY  AND ADENOIDECTOMY  2013    No Known Allergies  Prior to Admission medications   Medication Sig Start Date End Date Taking? Authorizing Provider  acetaminophen (TYLENOL) 500 MG tablet Take by mouth.    [provider]  albuterol (PROVENTIL HFA) 108 (90 Base) MCG/ACT inhaler Inhale into the lungs. 03/10/17 03/10/18  [provider]  aspirin EC 81 MG tablet Take by mouth.    [provider]  cetirizine (ZYRTEC) 10 MG tablet Take 10 mg by mouth daily.    [provider]  cholecalciferol (VITAMIN D) 1000 UNITS tablet Take 2,000 Units by mouth daily. Reported on 11/30/2015    [provider]  cyclobenzaprine (FLEXERIL) 5 MG tablet Take 5 mg by mouth 3 (three) times daily as needed for muscle spasms.    [provider]  cycloSPORINE (RESTASIS) 0.05 % ophthalmic emulsion 1 drop 2 (two) times daily.    [provider]  docusate sodium (COLACE) 100 MG capsule Take 100 mg 2 (two) times daily by mouth.    [provider]  fluticasone (FLONASE) 50 MCG/ACT nasal spray Place 2 sprays into both nostrils daily. 06/22/15   Rubbie Battiest, RN  insulin NPH Human (HUMULIN N,NOVOLIN N) 100 UNIT/ML injection Inject into the skin.    [provider]  insulin regular (NOVOLIN R,HUMULIN R) 100 units/mL injection Inject into the skin 3 (three) times daily before meals.    [provider]  magnesium oxide (MAG-OX) 400 MG tablet Take 400 mg  by mouth daily.    [provider]  ondansetron (ZOFRAN) 8 MG tablet Take by mouth. 04/21/17   [provider]  Prenatal Vit-Fe Fumarate-FA (PRENATAL MULTIVITAMIN) TABS tablet Take 1 tablet daily at 12 noon by mouth.    [provider]  PROAIR HFA 108 [phone] Base) MCG/ACT inhaler  03/10/17   [provider]  ranitidine (ZANTAC) 150 MG capsule Take by mouth.    [provider]  valACYclovir (VALTREX) 1000 MG tablet TAKE 1 TABLET TWICE A DAY 02/16/15   Rubbie Battiest, RN     Social History: She  reports that she quit smoking about 16 months ago. She has a 21.00 pack-year smoking history. She has never used smokeless tobacco. She reports that she does not drink alcohol or use drugs.  Family History: family history includes Alcohol abuse in her father; Arthritis in her mother; Heart disease in her father, maternal grandfather, and maternal grandmother; Hyperlipidemia in her father and maternal grandfather; Hypertension in her maternal grandfather; Kidney disease in her maternal grandmother; Obesity in her mother.    Review of Systems: A full review of systems was performed and negative except as noted in the HPI.     O:  Temp 98.2 F (36.8 C) (Oral)   Resp 18   LMP 01/15/2017  No results found for this or any previous visit (from the past 48 hour(s)).   Constitutional: NAD, AAOx3  HE/ENT: extraocular movements grossly intact, moist mucous membranes CV: RRR PULM: nl respiratory effort, CTABL     Abd: gravid, non-tender, non-distended, soft      Ext: Non-tender, Nonedmeatous   Psych: mood appropriate, speech normal Pelvic:  Baseline:  135 Variability: moderate Accelerations present x >2 Decelerations absent Time 49mins    A/P: 42 y.o. [redacted]w[redacted]d with high risk pregnancy and antepartum surveillance.   Labor: not present.   Fetal Wellbeing: Reassuring Cat 1 tracing.  Reactive NST   D/c home stable, precautions reviewed, follow-up as scheduled.   Has IOL scheduled next Monday  ----- Larey Days, MD Attending Obstetrician and Gynecologist [facility], Department of [facility]

## 2017-10-13 NOTE — Discharge Instructions (Signed)
Pt to return Monday for Induction of labor at 8 am. Pt agrees with plan of care. Continue to monitor contractions and if continue or increase in pain notify MD or Birthplace staff. If water breaks call immediately for care plan.  Pt agrees. Labor precautions discussed at length. C Kenyon Eshleman RNCF

## 2017-10-13 NOTE — Plan of Care (Signed)
Report to Dr. Leonides Schanz. She has reviewed tracing and states that  Pt is scheduled for IOL Monday.  Lenore Cordia RNC

## 2017-10-13 NOTE — Discharge Summary (Signed)
Brooke Rush is a 42 y.o. female. She is at [redacted]w[redacted]d gestation. Patient's last menstrual period was 01/15/2017. Estimated Date of Delivery: 10/22/17   Prenatal care site: [facility]   Chief Complaint: high risk pregnancy, need for antepartum surveillance  Gestational diabetes, on insulin  S: Resting comfortably. no CTX, no VB.no LOF,  Active fetal movement.    Maternal Medical History:   Past Medical History:  Diagnosis Date  . Arthritis    in her neck  . Constipation    2/2 pain medication  . Fibromyalgia    Diagnosed by Dr. Derrel Nip  . GERD (gastroesophageal reflux disease)   . Kidney stones   . Migraines     Past Surgical History:  Procedure Laterality Date  . CARPAL TUNNEL RELEASE Bilateral 1998  . CHOLECYSTECTOMY  2011  . ETHMOIDECTOMY Bilateral 12/02/2015   Procedure: LEFT TOTAL ETHMOIDECTOMY  RIGHT  REVISION TOTAL ETHMOIDECTOMY;  Surgeon: Carloyn Manner, MD;  Location: [place];  Service: ENT;  Laterality: Bilateral;  . FRONTAL SINUS EXPLORATION Bilateral 12/02/2015   Procedure: FRONTAL SINUSOTOMY;  Surgeon: Carloyn Manner, MD;  Location: [place];  Service: ENT;  Laterality: Bilateral;  . HEMORROIDECTOMY     Dr. Tamala Julian  . IMAGE GUIDED SINUS SURGERY N/A 12/02/2015   Procedure: IMAGE GUIDED SINUS SURGERY;  Surgeon: Carloyn Manner, MD;  Location: [place];  Service: ENT;  Laterality: N/A;  GAVE DISK TO CECE 6/22 DEE GEL [place]  . LEEP    . MAXILLARY ANTROSTOMY Left 12/02/2015   Procedure: MAXILLARY ANTROSTOMY;  Surgeon: Carloyn Manner, MD;  Location: [place];  Service: ENT;  Laterality: Left;  . NASAL SEPTUM SURGERY  2011  . NASAL SINUS SURGERY  2011   Dr. Pryor Ochoa  . REMOVAL OF EAR TUBE Bilateral 12/02/2015   Procedure: Bilateral Tube removal with Gel Film Myringoplasty;  Surgeon: Carloyn Manner, MD;  Location: [place];  Service: ENT;  Laterality: Bilateral;  . TONSILLECTOMY  AND ADENOIDECTOMY  2013    No Known Allergies  Prior to Admission medications   Medication Sig Start Date End Date Taking? Authorizing Provider  acetaminophen (TYLENOL) 500 MG tablet Take by mouth.    [provider]  albuterol (PROVENTIL HFA) 108 (90 Base) MCG/ACT inhaler Inhale into the lungs. 03/10/17 03/10/18  [provider]  aspirin EC 81 MG tablet Take by mouth.    [provider]  cetirizine (ZYRTEC) 10 MG tablet Take 10 mg by mouth daily.    [provider]  cholecalciferol (VITAMIN D) 1000 UNITS tablet Take 2,000 Units by mouth daily. Reported on 11/30/2015    [provider]  cyclobenzaprine (FLEXERIL) 5 MG tablet Take 5 mg by mouth 3 (three) times daily as needed for muscle spasms.    [provider]  cycloSPORINE (RESTASIS) 0.05 % ophthalmic emulsion 1 drop 2 (two) times daily.    [provider]  docusate sodium (COLACE) 100 MG capsule Take 100 mg 2 (two) times daily by mouth.    [provider]  fluticasone (FLONASE) 50 MCG/ACT nasal spray Place 2 sprays into both nostrils daily. 06/22/15   Rubbie Battiest, RN  insulin NPH Human (HUMULIN N,NOVOLIN N) 100 UNIT/ML injection Inject into the skin.    [provider]  insulin regular (NOVOLIN R,HUMULIN R) 100 units/mL injection Inject into the skin 3 (three) times daily before meals.    [provider]  magnesium oxide (MAG-OX) 400 MG tablet Take 400 mg  by mouth daily.    [provider]  ondansetron (ZOFRAN) 8 MG tablet Take by mouth. 04/21/17   [provider]  Prenatal Vit-Fe Fumarate-FA (PRENATAL MULTIVITAMIN) TABS tablet Take 1 tablet daily at 12 noon by mouth.    [provider]  PROAIR HFA 108 [phone] Base) MCG/ACT inhaler  03/10/17   [provider]  ranitidine (ZANTAC) 150 MG capsule Take by mouth.    [provider]  valACYclovir (VALTREX) 1000 MG tablet TAKE 1 TABLET TWICE A DAY 02/16/15   Rubbie Battiest, RN     Social History: She  reports that she quit smoking about 16 months ago. She has a 21.00 pack-year smoking history. She has never used smokeless tobacco. She reports that she does not drink alcohol or use drugs.  Family History: family history includes Alcohol abuse in her father; Arthritis in her mother; Heart disease in her father, maternal grandfather, and maternal grandmother; Hyperlipidemia in her father and maternal grandfather; Hypertension in her maternal grandfather; Kidney disease in her maternal grandmother; Obesity in her mother.    Review of Systems: A full review of systems was performed and negative except as noted in the HPI.     O:  Temp 98.2 F (36.8 C) (Oral)   Resp 18   LMP 01/15/2017  No results found for this or any previous visit (from the past 48 hour(s)).   Constitutional: NAD, AAOx3  HE/ENT: extraocular movements grossly intact, moist mucous membranes CV: RRR PULM: nl respiratory effort, CTABL     Abd: gravid, non-tender, non-distended, soft      Ext: Non-tender, Nonedmeatous   Psych: mood appropriate, speech normal Pelvic:  Baseline:  135 Variability: moderate Accelerations present x >2 Decelerations absent Time 13mins    A/P: 42 y.o. [redacted]w[redacted]d with high risk pregnancy and antepartum surveillance.   Labor: not present.   Fetal Wellbeing: Reassuring Cat 1 tracing.  Reactive NST   D/c home stable, precautions reviewed, follow-up as scheduled.   Has IOL scheduled next Monday  ----- Larey Days, MD Attending Obstetrician and Gynecologist [facility], Department of [facility]

## 2017-10-13 NOTE — Plan of Care (Signed)
Discharge instructions, both oral and written given to pt. She verbalized understanding of plan of care and will return at 8am Monday for IOL>  Labor precautions discussed at length. Pt agrees with plan of care.  Leaving dept in stable condition ambulatory. Lenore Cordia RNC

## 2017-10-16 ENCOUNTER — Inpatient Hospital Stay
Admission: AD | Admit: 2017-10-16 | Discharge: 2017-10-18 | DRG: 807 | Disposition: A | Discharge status: Home / Self Care | Payer: BC Managed Care – PPO | Source: Ambulatory Visit | Attending: Obstetrics and Gynecology | Admitting: Obstetrics and Gynecology

## 2017-10-16 ENCOUNTER — Other Ambulatory Visit: Payer: Self-pay

## 2017-10-16 ENCOUNTER — Inpatient Hospital Stay: Payer: BC Managed Care – PPO | Admitting: Anesthesiology

## 2017-10-16 ENCOUNTER — Encounter: Payer: Self-pay | Admitting: *Deleted

## 2017-10-16 DIAGNOSIS — O99344 Other mental disorders complicating childbirth: Secondary | ICD-10-CM | POA: Diagnosis present

## 2017-10-16 DIAGNOSIS — F419 Anxiety disorder, unspecified: Secondary | ICD-10-CM | POA: Diagnosis present

## 2017-10-16 DIAGNOSIS — Z3A39 39 weeks gestation of pregnancy: Secondary | ICD-10-CM

## 2017-10-16 DIAGNOSIS — Z87891 Personal history of nicotine dependence: Secondary | ICD-10-CM | POA: Diagnosis not present

## 2017-10-16 DIAGNOSIS — O1204 Gestational edema, complicating childbirth: Secondary | ICD-10-CM | POA: Diagnosis present

## 2017-10-16 DIAGNOSIS — O24424 Gestational diabetes mellitus in childbirth, insulin controlled: Principal | ICD-10-CM | POA: Diagnosis present

## 2017-10-16 DIAGNOSIS — F329 Major depressive disorder, single episode, unspecified: Secondary | ICD-10-CM | POA: Diagnosis present

## 2017-10-16 DIAGNOSIS — O9989 Other specified diseases and conditions complicating pregnancy, childbirth and the puerperium: Secondary | ICD-10-CM | POA: Diagnosis present

## 2017-10-16 DIAGNOSIS — O24414 Gestational diabetes mellitus in pregnancy, insulin controlled: Secondary | ICD-10-CM | POA: Diagnosis present

## 2017-10-16 DIAGNOSIS — M797 Fibromyalgia: Secondary | ICD-10-CM | POA: Diagnosis present

## 2017-10-16 LAB — COMPREHENSIVE METABOLIC PANEL
ALK PHOS: 325 U/L — AB (ref 38–126)
ALT: 10 U/L — AB (ref 14–54)
ANION GAP: 10 (ref 5–15)
AST: 28 U/L (ref 15–41)
Albumin: 3 g/dL — ABNORMAL LOW (ref 3.5–5.0)
BUN: 6 mg/dL (ref 6–20)
CALCIUM: 9 mg/dL (ref 8.9–10.3)
CO2: 19 mmol/L — ABNORMAL LOW (ref 22–32)
CREATININE: 0.47 mg/dL (ref 0.44–1.00)
Chloride: 104 mmol/L (ref 101–111)
Glucose, Bld: 132 mg/dL — ABNORMAL HIGH (ref 65–99)
Potassium: 3.4 mmol/L — ABNORMAL LOW (ref 3.5–5.1)
Sodium: 133 mmol/L — ABNORMAL LOW (ref 135–145)
Total Bilirubin: 0.6 mg/dL (ref 0.3–1.2)
Total Protein: 6.6 g/dL (ref 6.5–8.1)

## 2017-10-16 LAB — CBC
HCT: 35.9 % (ref 35.0–47.0)
Hemoglobin: 12.4 g/dL (ref 12.0–16.0)
MCH: 30.8 pg (ref 26.0–34.0)
MCHC: 34.5 g/dL (ref 32.0–36.0)
MCV: 89.2 fL (ref 80.0–100.0)
PLATELETS: 278 10*3/uL (ref 150–440)
RBC: 4.03 MIL/uL (ref 3.80–5.20)
RDW: 14.4 % (ref 11.5–14.5)
WBC: 8.5 10*3/uL (ref 3.6–11.0)

## 2017-10-16 LAB — TYPE AND SCREEN
ABO/RH(D): A POS
ANTIBODY SCREEN: NEGATIVE

## 2017-10-16 LAB — GLUCOSE, CAPILLARY
GLUCOSE-CAPILLARY: 127 mg/dL — AB (ref 65–99)
GLUCOSE-CAPILLARY: 109 mg/dL — AB (ref 65–99)
Glucose-Capillary: 93 mg/dL (ref 65–99)
Glucose-Capillary: 86 mg/dL (ref 65–99)

## 2017-10-16 MED ORDER — PHENYLEPHRINE 40 MCG/ML (10ML) SYRINGE FOR IV PUSH (FOR BLOOD PRESSURE SUPPORT)
80.0000 ug | PREFILLED_SYRINGE | INTRAVENOUS | Status: DC | PRN
  Filled 2017-10-16: qty 5

## 2017-10-16 MED ORDER — DIPHENHYDRAMINE HCL 50 MG/ML IJ SOLN
12.5000 mg | INTRAMUSCULAR | Status: DC | PRN
  Administered 2017-10-16: 12.5 mg via INTRAVENOUS
  Filled 2017-10-16: qty 1

## 2017-10-16 MED ORDER — TERBUTALINE SULFATE 1 MG/ML IJ SOLN: 0.2500 mg | Freq: Once | INTRAMUSCULAR | Status: DC | PRN

## 2017-10-16 MED ORDER — ONDANSETRON HCL 4 MG/2ML IJ SOLN: 4.0000 mg | Freq: Four times a day (QID) | INTRAMUSCULAR | Status: DC | PRN

## 2017-10-16 MED ORDER — BUPIVACAINE HCL (PF) 0.25 % IJ SOLN
INTRAMUSCULAR | Status: DC | PRN
  Administered 2017-10-16 (×2): 4 mL via EPIDURAL

## 2017-10-16 MED ORDER — OXYTOCIN 40 UNITS IN LACTATED RINGERS INFUSION - SIMPLE MED
2.5000 [IU]/h | INTRAVENOUS | Status: DC
  Administered 2017-10-17: 2.5 [IU]/h via INTRAVENOUS
  Filled 2017-10-16 (×2): qty 1000

## 2017-10-16 MED ORDER — EPHEDRINE 5 MG/ML INJ
10.0000 mg | INTRAVENOUS | Status: DC | PRN
  Filled 2017-10-16: qty 2

## 2017-10-16 MED ORDER — ACETAMINOPHEN 325 MG PO TABS: 650.0000 mg | ORAL_TABLET | ORAL | Status: DC | PRN

## 2017-10-16 MED ORDER — LIDOCAINE HCL (PF) 1 % IJ SOLN
30.0000 mL | INTRAMUSCULAR | Status: AC | PRN
  Administered 2017-10-16: 3 mL via SUBCUTANEOUS
  Filled 2017-10-16: qty 30

## 2017-10-16 MED ORDER — SOD CITRATE-CITRIC ACID 500-334 MG/5ML PO SOLN: 30.0000 mL | ORAL | Status: DC | PRN

## 2017-10-16 MED ORDER — LACTATED RINGERS IV SOLN
INTRAVENOUS | Status: DC
  Administered 2017-10-16: 1000 mL via INTRAVENOUS
  Administered 2017-10-16: 09:00:00 via INTRAVENOUS

## 2017-10-16 MED ORDER — AMMONIA AROMATIC IN INHA
RESPIRATORY_TRACT | Status: AC
  Filled 2017-10-16: qty 10

## 2017-10-16 MED ORDER — FENTANYL 2.5 MCG/ML W/ROPIVACAINE 0.15% IN NS 100 ML EPIDURAL (ARMC)
12.0000 mL/h | EPIDURAL | Status: DC
  Administered 2017-10-16: 12 mL/h via EPIDURAL

## 2017-10-16 MED ORDER — MISOPROSTOL 200 MCG PO TABS
ORAL_TABLET | ORAL | Status: AC
  Filled 2017-10-16: qty 4

## 2017-10-16 MED ORDER — FENTANYL 2.5 MCG/ML W/ROPIVACAINE 0.15% IN NS 100 ML EPIDURAL (ARMC)
EPIDURAL | Status: AC
  Filled 2017-10-16: qty 100

## 2017-10-16 MED ORDER — BUTORPHANOL TARTRATE 1 MG/ML IJ SOLN
1.0000 mg | INTRAMUSCULAR | Status: DC | PRN
  Administered 2017-10-16: 1 mg via INTRAVENOUS
  Filled 2017-10-16: qty 1

## 2017-10-16 MED ORDER — LACTATED RINGERS IV SOLN: 500.0000 mL | INTRAVENOUS | Status: DC | PRN

## 2017-10-16 MED ORDER — OXYTOCIN BOLUS FROM INFUSION
500.0000 mL | Freq: Once | INTRAVENOUS | Status: AC
  Administered 2017-10-16: 500 mL via INTRAVENOUS

## 2017-10-16 MED ORDER — OXYTOCIN 40 UNITS IN LACTATED RINGERS INFUSION - SIMPLE MED
1.0000 m[IU]/min | INTRAVENOUS | Status: DC
  Administered 2017-10-16: 2 m[IU]/min via INTRAVENOUS

## 2017-10-16 MED ORDER — LACTATED RINGERS IV SOLN: 500.0000 mL | Freq: Once | INTRAVENOUS | Status: DC

## 2017-10-16 MED ORDER — LIDOCAINE-EPINEPHRINE (PF) 1.5 %-1:200000 IJ SOLN
INTRAMUSCULAR | Status: DC | PRN
  Administered 2017-10-16: 4 mL via EPIDURAL

## 2017-10-16 MED ORDER — INSULIN ASPART 100 UNIT/ML ~~LOC~~ SOLN
0.0000 [IU] | SUBCUTANEOUS | Status: DC
  Administered 2017-10-17 (×5): 2 [IU] via SUBCUTANEOUS
  Filled 2017-10-16 (×6): qty 1

## 2017-10-16 NOTE — H&P (Signed)
Brooke Rush is a 42 y.o. female presenting for induction of labor for ID GDM . Current insulin is #*+16 , 16+16 in pm . She only took half today with meal .  OB History    Gravida  8   Para  3   Term  3   Preterm      AB  4   Living  2     SAB  4   TAB      Ectopic      Multiple      Live Births  3          Past Medical History:  Diagnosis Date  . Arthritis    in her neck  . Constipation    2/2 pain medication  . Fibromyalgia    Diagnosed by Dr. Derrel Nip  . GERD (gastroesophageal reflux disease)   . Kidney stones   . Migraines    Past Surgical History:  Procedure Laterality Date  . CARPAL TUNNEL RELEASE Bilateral 1998  . CHOLECYSTECTOMY  2011  . ETHMOIDECTOMY Bilateral 12/02/2015   Procedure: LEFT TOTAL ETHMOIDECTOMY  RIGHT  REVISION TOTAL ETHMOIDECTOMY;  Surgeon: Carloyn Manner, MD;  Location: [place];  Service: ENT;  Laterality: Bilateral;  . FRONTAL SINUS EXPLORATION Bilateral 12/02/2015   Procedure: FRONTAL SINUSOTOMY;  Surgeon: Carloyn Manner, MD;  Location: [place];  Service: ENT;  Laterality: Bilateral;  . HEMORROIDECTOMY     Dr. Tamala Julian  . IMAGE GUIDED SINUS SURGERY N/A 12/02/2015   Procedure: IMAGE GUIDED SINUS SURGERY;  Surgeon: Carloyn Manner, MD;  Location: [place];  Service: ENT;  Laterality: N/A;  GAVE DISK TO CECE 6/22 DEE GEL [place]  . LEEP    . MAXILLARY ANTROSTOMY Left 12/02/2015   Procedure: MAXILLARY ANTROSTOMY;  Surgeon: Carloyn Manner, MD;  Location: [place];  Service: ENT;  Laterality: Left;  . NASAL SEPTUM SURGERY  2011  . NASAL SINUS SURGERY  2011   Dr. Pryor Ochoa  . REMOVAL OF EAR TUBE Bilateral 12/02/2015   Procedure: Bilateral Tube removal with Gel Film Myringoplasty;  Surgeon: Carloyn Manner, MD;  Location: [place];  Service: ENT;  Laterality: Bilateral;  . TONSILLECTOMY AND ADENOIDECTOMY  2013   Family History: family history includes Alcohol  abuse in her father; Arthritis in her mother; Heart disease in her father, maternal grandfather, and maternal grandmother; Hyperlipidemia in her father and maternal grandfather; Hypertension in her maternal grandfather; Kidney disease in her maternal grandmother; Obesity in her mother. Social History:  reports that she quit smoking about 16 months ago. She has a 21.00 pack-year smoking history. She has never used smokeless tobacco. She reports that she does not drink alcohol or use drugs.     Maternal Diabetes: Yes:  Diabetes Type:  Insulin/Medication controlled Genetic Screening: Normal Maternal Ultrasounds/Referrals: Normal Fetal Ultrasounds or other Referrals:  None, nl fetal echo  Maternal Substance Abuse:  No Significant Maternal Medications:  None, insulin  Significant Maternal Lab Results:  None Other Comments:  None  ROS History Dilation: 2 Effacement (%): 80 Station: -3 Exam by:: Dr.Shanah Guimaraes Blood pressure 121/67, pulse 99, temperature 98.6 F (37 C), temperature source Oral, resp. rate 19, height 5\' 4"  (1.626 m), weight 103 kg (227 lb), last menstrual period 01/15/2017. Exam Physical Exam  Lungs CTA  CV RRR abd : gravid  Reassuring fetal monitoring  Prenatal labs: ABO, Rh: --/--/A POS (05/13 8413) Antibody: NEG (05/13 0929) Rubella: Immune (10/26 0000) varicella neg  RPR: Nonreactive (04/29 0000)  HBsAg: Negative (10/24 0000)  HIV: Non-reactive (04/29 0000)  GBS: Negative (04/25 0000)   Assessment/Plan: Induction  AROM  + pitocin  Glucose check q 4 hrs    Brooke Rush 10/16/2017, 12:12 PM

## 2017-10-16 NOTE — Discharge Summary (Signed)
Obstetric Discharge Summary   Patient ID: Patient Name: Brooke Rush DOB: 1976/02/17 MRN: [id number]  Date of Admission: 10/16/2017 Date of Delivery: 10/17/2017 at 2258 (manually enter date) Delivered by: Schermerhorn MD Date of Discharge: 10/18/17 Primary OB: [place] WUJ:[id number] last menstrual period was 01/15/2017. EDC Estimated Date of Delivery: 10/22/17 Gestational Age at Delivery: [redacted]w[redacted]d   Antepartum complications:ID GDM, depression/Anxiety Admitting Diagnosis:induction for ID GDM  Secondary Diagnoses: Patient Active Problem List   Diagnosis Date Noted  . Insulin controlled gestational diabetes mellitus (GDM) in third trimester 10/16/2017  . Gestational diabetes 10/10/2017  . NST (non-stress test) reactive 10/02/2017  . Pregnancy 09/30/2017  . Preterm premature rupture of membranes, antepartum 09/14/2017  . Labor and delivery indication for care or intervention 07/13/2017  . Advanced maternal age in multigravida, first trimester   . Family history of SIDS (sudden infant death syndrome)   . Supervision of high risk pregnancy in third trimester 03/27/2017  . Chronic bilateral low back pain with bilateral sciatica 09/12/2016  . Hip pain 09/12/2016  . Thoracic outlet syndrome 06/03/2016  . Anxiety 06/03/2016  . Migraine 06/03/2016  . Fibromyalgia 08/05/2013  . Hypertriglyceridemia 08/05/2013  . Fatigue 08/05/2013  . Tobacco abuse 08/05/2013    Augmentation: Pitocin Complications: None Intrapartum complications/course: IOL at 39+1 Delivery Type: spontaneous vaginal delivery Anesthesia: epidural Placenta: spontaneous Laceration: 2nd deg perineal with repair Episiotomy: none  Newborn Data: Live born child  Birth Weight:   APGAR: 72, 9  Newborn Delivery   Birth date/time:  10/16/2017 22:58:00 Delivery type:  Vaginal, Spontaneous         Postpartum Course  Patient had an uncomplicated postpartum course.  By time of discharge on PPD#2,  her pain was controlled on oral pain medications; she had appropriate lochia and was ambulating, voiding without difficulty and tolerating regular diet.  She was deemed stable for discharge to home.       Labs: CBC Latest Ref Rng & Units 10/17/2017 10/16/2017 05/21/2015  WBC 3.6 - 11.0 K/uL 9.6 8.5 8.9  Hemoglobin 12.0 - 16.0 g/dL 12.5 12.4 14.2  Hematocrit 35.0 - 47.0 % 36.2 35.9 43.5  Platelets 150 - 440 K/uL 239 278 268   A POS  Physical exam:  BP 128/78 (BP Location: Right Arm)   Pulse 93   Temp 98.2 F (36.8 C) (Oral)   Resp 20   Ht 5\' 4"  (1.626 m)   Wt 227 lb (103 kg)   LMP 01/15/2017   SpO2 98%   Breastfeeding? Unknown   BMI 38.96 kg/m  General: alert and no distress Pulm: normal respiratory effort Lochia: appropriate Abdomen: soft, NT Uterine Fundus: Fundus firm, below umbilicus Incision: c/d/i, healing well, no significant drainage, no dehiscence, no significant erythema Extremities: No evidence of DVT seen on physical exam. No lower extremity edema. Voiding well Audible BS Neg Homans sign. FS glucose>120 over 50% of time requiring sliding scale insulin  Disposition: stable, discharge to home Baby Feeding: breastmilk Baby Disposition: home with mom  Contraception: husband vasectomy  Prenatal Labs:  ABO, Rh: --/--/A POS (05/13 2956) Antibody: NEG (05/13 2130) Rubella: Immune (10/26 0000) varicella neg  RPR: Nonreactive (04/29 0000)  HBsAg: Negative (10/24 0000)  HIV: Non-reactive (04/29 0000)  GBS: Negative (04/25 0000)       Rh Immune globulin given: n/a  Plan:  [name] was discharged to home in good condition. Follow-up appointment at [facility] with delivery provider in 6 weeks Pt to go to  lab for 75 gms load of glucola prior to the 6 week visit fasting  Discharge Instructions: Per After Visit Summary. Activity: Advance as tolerated. Pelvic rest for 6 weeks.   Diet: Regular Discharge Medications: PNV, Fe, Pt stopped  Effexor, Stop insulin and will do a 75 grams load of glucose at 6 weeks so that the results are in by the actual visit. I carried over the 75 gm load and handed to pt the [facility] lab order per Dr Schermerhorn's direction Outpatient follow up:  Follow-up Information    Schermerhorn, Gwen Her, MD Follow up in 6 week(s).   Specialty:  Obstetrics and Gynecology Contact information: [address] [place] [address] [phone]          Signed:  Danford Bad, MSN, CNM, [place] Certified Nurse Midwife [facility]

## 2017-10-16 NOTE — Anesthesia Procedure Notes (Signed)
Epidural Patient location during procedure: OB  Staffing Performed: anesthesiologist   Preanesthetic Checklist Completed: patient identified, site marked, surgical consent, pre-op evaluation, timeout performed, IV checked, risks and benefits discussed and monitors and equipment checked  Epidural Patient position: sitting Prep: Betadine Patient monitoring: heart rate, continuous pulse ox and blood pressure Approach: midline Location: L4-L5 Injection technique: LOR saline  Needle:  Needle type: Tuohy  Needle gauge: 17 G Needle length: 9 cm and 9 Needle insertion depth: 5 cm Catheter type: closed end flexible Catheter size: 19 Gauge Catheter at skin depth: 12 cm Test dose: negative and 1.5% lidocaine with Epi 1:200 K  Assessment Sensory level: T10 Events: blood not aspirated, injection not painful, no injection resistance, negative IV test and no paresthesia  Additional Notes   Patient tolerated the insertion well without complications.-SATD -IVTD. No paresthesia. Refer to [place] for VS and dosingReason for block:procedure for pain

## 2017-10-16 NOTE — Anesthesia Preprocedure Evaluation (Signed)
Anesthesia Evaluation  Patient identified by MRN, date of birth, ID band Patient awake    Reviewed: Allergy & Precautions, H&P , NPO status , Patient's Chart, lab work & pertinent test results, reviewed documented beta blocker date and time   Airway Mallampati: II  TM Distance: >3 FB Neck ROM: full    Dental no notable dental hx. (+) Teeth Intact   Pulmonary neg pulmonary ROS, Current Smoker, former smoker,    Pulmonary exam normal breath sounds clear to auscultation       Cardiovascular Exercise Tolerance: Good negative cardio ROS   Rhythm:regular Rate:Normal     Neuro/Psych negative neurological ROS  negative psych ROS   GI/Hepatic negative GI ROS, Neg liver ROS,   Endo/Other  negative endocrine ROSdiabetes  Renal/GU      Musculoskeletal   Abdominal   Peds  Hematology negative hematology ROS (+)   Anesthesia Other Findings   Reproductive/Obstetrics (+) Pregnancy                             Anesthesia Physical Anesthesia Plan  ASA: II  Anesthesia Plan: Epidural   Post-op Pain Management:    Induction:   PONV Risk Score and Plan:   Airway Management Planned:   Additional Equipment:   Intra-op Plan:   Post-operative Plan:   Informed Consent: I have reviewed the patients History and Physical, chart, labs and discussed the procedure including the risks, benefits and alternatives for the proposed anesthesia with the patient or authorized representative who has indicated his/her understanding and acceptance.       Plan Discussed with:   Anesthesia Plan Comments:         Anesthesia Quick Evaluation  

## 2017-10-16 NOTE — Progress Notes (Signed)
Brooke Rush is a 42 y.o. [phone] at [redacted]w[redacted]d induction today  She took half of her normal insulin today  Subjective:   Objective: BP 121/67 (BP Location: Left Arm)   Pulse 99   Temp 98.6 F (37 C) (Oral)   Resp 19   Ht 5\' 4"  (1.626 m)   Wt 103 kg (227 lb)   LMP 01/15/2017   BMI 38.96 kg/m  No intake/output data recorded. No intake/output data recorded.  FHT:  FHR: 130 bpm, variability: moderate,  accelerations:  Present,  decelerations:  Absent UC:   irregular, every 5 minutes SVE:   AROM  ++ clear fluid  2 cm / 80 / -3   Glucose 127   Labs: Lab Results  Component Value Date   WBC 8.5 10/16/2017   HGB 12.4 10/16/2017   HCT 35.9 10/16/2017   MCV 89.2 10/16/2017   PLT 278 10/16/2017    Assessment / Plan:ID GDM  Induction  Reassuring fetal monitoring  Start Pitocin augmentation   Brooke Rush 10/16/2017, 12:04 PM

## 2017-10-16 NOTE — Progress Notes (Signed)
Brooke Rush is a 42 y.o. [id number] at [redacted]w[redacted]d  Pitocin at 16 mu/ min  CTX more painful  Subjective:   Objective: BP (!) 123/59   Pulse (!) 119   Temp 98.2 F (36.8 C) (Oral)   Resp 18   Ht 5\' 4"  (1.626 m)   Wt 103 kg (227 lb)   LMP 01/15/2017   SpO2 96%   BMI 38.96 kg/m  I/O last 3 completed shifts: In: 1289 [I.V.:1289] Out: -  No intake/output data recorded.  FHT:  FHR: 140 bpm, variability: minimal ,  accelerations:  Present,  decelerations:  Absent UC:   regular, every 2-3 minutes SVE:   Dilation: 3 Effacement (%): 100 Station: -2 Exam by:: schermerhorn  Labs: Lab Results  Component Value Date   WBC 8.5 10/16/2017   HGB 12.4 10/16/2017   HCT 35.9 10/16/2017   MCV 89.2 10/16/2017   PLT 278 10/16/2017   Glucose values : nl today  Assessment / Plan:Thomas J Schermerhorn 10/16/2017, 7:32 PM Cont pitocin and fetal monitoring

## 2017-10-17 ENCOUNTER — Encounter: Payer: Self-pay | Admitting: *Deleted

## 2017-10-17 LAB — GLUCOSE, CAPILLARY
GLUCOSE-CAPILLARY: 137 mg/dL — AB (ref 65–99)
Glucose-Capillary: 177 mg/dL — ABNORMAL HIGH (ref 65–99)
Glucose-Capillary: 121 mg/dL — ABNORMAL HIGH (ref 65–99)
Glucose-Capillary: 128 mg/dL — ABNORMAL HIGH (ref 65–99)
Glucose-Capillary: 136 mg/dL — ABNORMAL HIGH (ref 65–99)
Glucose-Capillary: 160 mg/dL — ABNORMAL HIGH (ref 65–99)
Glucose-Capillary: 119 mg/dL — ABNORMAL HIGH (ref 65–99)

## 2017-10-17 LAB — CBC
HCT: 36.2 % (ref 35.0–47.0)
HEMOGLOBIN: 12.5 g/dL (ref 12.0–16.0)
MCH: 30.7 pg (ref 26.0–34.0)
MCHC: 34.4 g/dL (ref 32.0–36.0)
MCV: 89.5 fL (ref 80.0–100.0)
Platelets: 239 10*3/uL (ref 150–440)
RBC: 4.05 MIL/uL (ref 3.80–5.20)
RDW: 14.5 % (ref 11.5–14.5)
WBC: 9.6 10*3/uL (ref 3.6–11.0)

## 2017-10-17 LAB — RPR: RPR: NONREACTIVE

## 2017-10-17 MED ORDER — ZOLPIDEM TARTRATE 5 MG PO TABS: 5.0000 mg | ORAL_TABLET | Freq: Every evening | ORAL | Status: DC | PRN

## 2017-10-17 MED ORDER — MAGNESIUM HYDROXIDE 400 MG/5ML PO SUSP: 30.0000 mL | ORAL | Status: DC | PRN

## 2017-10-17 MED ORDER — SIMETHICONE 80 MG PO CHEW
80.0000 mg | CHEWABLE_TABLET | ORAL | Status: DC | PRN
Start: 2017-10-17 — End: 2017-10-18
  Administered 2017-10-17: 80 mg via ORAL
  Filled 2017-10-17: qty 1

## 2017-10-17 MED ORDER — NAPROXEN 500 MG PO TABS
500.0000 mg | ORAL_TABLET | Freq: Two times a day (BID) | ORAL | Status: DC
  Administered 2017-10-17: 500 mg via ORAL
  Filled 2017-10-17 (×3): qty 1

## 2017-10-17 MED ORDER — IBUPROFEN 600 MG PO TABS
600.0000 mg | ORAL_TABLET | Freq: Four times a day (QID) | ORAL | Status: DC
  Administered 2017-10-17 (×3): 600 mg via ORAL
  Filled 2017-10-17 (×3): qty 1

## 2017-10-17 MED ORDER — WITCH HAZEL-GLYCERIN EX PADS
1.0000 | MEDICATED_PAD | CUTANEOUS | Status: DC | PRN
Start: 2017-10-17 — End: 2017-10-18
  Filled 2017-10-17: qty 100

## 2017-10-17 MED ORDER — OXYCODONE HCL 5 MG PO TABS
5.0000 mg | ORAL_TABLET | Freq: Four times a day (QID) | ORAL | Status: DC | PRN
  Administered 2017-10-17 – 2017-10-18 (×3): 5 mg via ORAL
  Filled 2017-10-17 (×3): qty 1

## 2017-10-17 MED ORDER — ONDANSETRON HCL 4 MG PO TABS: 4.0000 mg | ORAL_TABLET | ORAL | Status: DC | PRN

## 2017-10-17 MED ORDER — ONDANSETRON HCL 4 MG/2ML IJ SOLN: 4.0000 mg | INTRAMUSCULAR | Status: DC | PRN

## 2017-10-17 MED ORDER — FERROUS SULFATE 325 (65 FE) MG PO TABS
325.0000 mg | ORAL_TABLET | Freq: Two times a day (BID) | ORAL | Status: DC
  Administered 2017-10-17 – 2017-10-18 (×3): 325 mg via ORAL
  Filled 2017-10-17 (×3): qty 1

## 2017-10-17 MED ORDER — MEASLES, MUMPS & RUBELLA VAC ~~LOC~~ INJ: 0.5000 mL | INJECTION | Freq: Once | SUBCUTANEOUS | Status: DC

## 2017-10-17 MED ORDER — PRENATAL MULTIVITAMIN CH
1.0000 | ORAL_TABLET | Freq: Every day | ORAL | Status: DC
  Administered 2017-10-17 – 2017-10-18 (×2): 1 via ORAL
  Filled 2017-10-17 (×2): qty 1

## 2017-10-17 MED ORDER — DIPHENHYDRAMINE HCL 25 MG PO CAPS
25.0000 mg | ORAL_CAPSULE | Freq: Four times a day (QID) | ORAL | Status: DC | PRN
Start: 2017-10-17 — End: 2017-10-18

## 2017-10-17 MED ORDER — VENLAFAXINE HCL ER 37.5 MG PO CP24
37.5000 mg | ORAL_CAPSULE | Freq: Every day | ORAL | Status: DC
  Filled 2017-10-17 (×2): qty 1

## 2017-10-17 MED ORDER — SENNOSIDES-DOCUSATE SODIUM 8.6-50 MG PO TABS
2.0000 | ORAL_TABLET | ORAL | Status: DC
  Administered 2017-10-17 – 2017-10-18 (×2): 2 via ORAL
  Filled 2017-10-17 (×2): qty 2

## 2017-10-17 MED ORDER — ACETAMINOPHEN 325 MG PO TABS
650.0000 mg | ORAL_TABLET | ORAL | Status: DC | PRN
  Administered 2017-10-17 – 2017-10-18 (×6): 650 mg via ORAL
  Filled 2017-10-17 (×6): qty 2

## 2017-10-17 MED ORDER — DIBUCAINE 1 % RE OINT
1.0000 "application " | TOPICAL_OINTMENT | RECTAL | Status: DC | PRN
  Filled 2017-10-17: qty 28

## 2017-10-17 MED ORDER — BENZOCAINE-MENTHOL 20-0.5 % EX AERO
1.0000 "application " | INHALATION_SPRAY | CUTANEOUS | Status: DC | PRN
  Filled 2017-10-17: qty 56

## 2017-10-17 MED ORDER — COCONUT OIL OIL: 1.0000 "application " | TOPICAL_OIL | Status: DC | PRN

## 2017-10-17 NOTE — Progress Notes (Signed)
Post Partum Day 1 Subjective: Doing well overall, Tolerating regular diet, pain with PO meds, voiding and ambulating without difficulty. No CP SOB Fever,Chills, N/V or leg pain; denies nipple or breast pain no HA change of vision, RUQ/epigastric pain. C/o hand and LE edema, also c/o increasing discomforts related to fibromyalgia, feeling down/moody and would like to consider restarting anti-dpressant. Previously on Effexor, which worked well for anxietty/depression and also for fibromyalgia control.   Objective: BP 128/78 (BP Location: Right Arm)   Pulse 93   Temp 98.2 F (36.8 C) (Oral)   Resp 20   Ht 5\' 4"  (1.626 m)   Wt 227 lb (103 kg)   LMP 01/15/2017   SpO2 98%   Breastfeeding? Unknown   BMI 38.96 kg/m    Physical Exam:  General: NAD Breasts: soft/nontender CV: RRR Pulm: nl effort, CTABL Abdomen: soft, NT, BS x 4 Perineum: minimal edema, repair well approximated Lochia: small Uterine Fundus: fundus firm and 1 fb below umbilicus DVT Evaluation: no cords, ttp LEs   Recent Labs    10/16/17 0929 10/17/17 0701  HGB 12.4 12.5  HCT 35.9 36.2  WBC 8.5 9.6  PLT 278 239    Assessment/Plan: 42 y.o. [id number] postpartum day # 1  - Continue routine PP care - Lactation consult, will discuss with lactation regarding breastfeeding safetly of previously used meds for fibromyalgia.   - Discussed contraceptive options, plans spouse to have vasectomy, reviewed timing of same and need for backup method for period of time. Pt will consider LARC method, maybe IUD for decreasing menstrual flow.   - Immunization status: all Imms up to date - reviewed options, most SNRIs and SSRIs are poorly studied but benefits outweigh potential risks, Effexor ordered- used previously with good response. Lyrica is contraindicated in breastfeeding due to potential for sedation of the infant.    Disposition: Does not desire Dc home today.     [place], [place], CNM 10/17/2017  5:27 PM

## 2017-10-17 NOTE — Anesthesia Postprocedure Evaluation (Signed)
Anesthesia Post Note  Patient: Brooke Rush  Procedure(s) Performed: AN AD [place]  Patient location during evaluation: Mother Baby Anesthesia Type: Epidural Level of consciousness: awake and alert Pain management: pain level controlled Vital Signs Assessment: post-procedure vital signs reviewed and stable Respiratory status: spontaneous breathing, nonlabored ventilation and respiratory function stable Cardiovascular status: stable Postop Assessment: no headache, no backache and epidural receding Anesthetic complications: no     Last Vitals:  Vitals:   10/17/17 0252 10/17/17 0517  BP: 123/65 109/77  Pulse: (!) 108 (!) 117  Resp:  20  Temp:  (!) 36.4 C  SpO2:  99%    Last Pain:  Vitals:   10/17/17 0601  TempSrc:   PainSc: 8                  Alison Stalling

## 2017-10-17 NOTE — Progress Notes (Signed)
Blood glucose 177. Obtained insulin vial from mother/baby to administer sliding scale insulin as ordered. Vial noted to be expired. Called pharmacy at 0258 to request additional vial. Spoke with [facility] from pharmacy. Still awaiting insulin at this time.

## 2017-10-17 NOTE — Lactation Note (Signed)
This note was copied from a baby's chart. Lactation Consultation Note  Patient Name: Brooke Rush Today's Date: 10/17/2017   During rounds, Mom said she was pumping and bottle feeding due to poor latch flattening her nipples and baby had low blood sugar. She denied having pump questions or problems. I encouraged her to let [place] observe a pumping to ensure proper flange size, and also to try hand expression. I answered her questions and also suggested how we may be able to help her with breastfeeding if she still chose do try it again. She has [place] contact info     Maternal Data    Feeding Feeding Type: Bottle Fed - Formula Nipple Type: Slow - flow  LATCH Score                   Interventions    Lactation Tools Discussed/Used     Consult Status      Roque Cash 10/17/2017, 5:46 PM

## 2017-10-18 LAB — GLUCOSE, CAPILLARY
Glucose-Capillary: 116 mg/dL — ABNORMAL HIGH (ref 65–99)
Glucose-Capillary: 118 mg/dL — ABNORMAL HIGH (ref 65–99)

## 2017-10-18 MED ORDER — FERROUS SULFATE 325 (65 FE) MG PO TABS: 325.0000 mg | ORAL_TABLET | Freq: Two times a day (BID) | ORAL | 3 refills | Status: DC

## 2017-10-18 MED ORDER — INSULIN ASPART 100 UNIT/ML ~~LOC~~ SOLN: 0.0000 [IU] | SUBCUTANEOUS | 11 refills | Status: DC

## 2017-10-18 NOTE — Progress Notes (Signed)
Patient discharged home with infant. Discharge instructions and prescriptions given and reviewed with patient. Patient verbalized understanding. Escorted out by auxillary.  

## 2017-10-18 NOTE — Discharge Instructions (Signed)
Diabetes Mellitus and Nutrition When you have diabetes (diabetes mellitus), it is very important to have healthy eating habits because your blood sugar (glucose) levels are greatly affected by what you eat and drink. Eating healthy foods in the appropriate amounts, at about the same times every day, can help you:  Control your blood glucose.  Lower your risk of heart disease.  Improve your blood pressure.  Reach or maintain a healthy weight.  Every person with diabetes is different, and each person has different needs for a meal plan. Your health care provider may recommend that you work with a diet and nutrition specialist (dietitian) to make a meal plan that is best for you. Your meal plan may vary depending on factors such as:  The calories you need.  The medicines you take.  Your weight.  Your blood glucose, blood pressure, and cholesterol levels.  Your activity level.  Other health conditions you have, such as heart or kidney disease.  How do carbohydrates affect me? Carbohydrates affect your blood glucose level more than any other type of food. Eating carbohydrates naturally increases the amount of glucose in your blood. Carbohydrate counting is a method for keeping track of how many carbohydrates you eat. Counting carbohydrates is important to keep your blood glucose at a healthy level, especially if you use insulin or take certain oral diabetes medicines. It is important to know how many carbohydrates you can safely have in each meal. This is different for every person. Your dietitian can help you calculate how many carbohydrates you should have at each meal and for snack. Foods that contain carbohydrates include:  Bread, cereal, rice, pasta, and crackers.  Potatoes and corn.  Peas, beans, and lentils.  Milk and yogurt.  Fruit and juice.  Desserts, such as cakes, cookies, ice cream, and candy.  How does alcohol affect me? Alcohol can cause a sudden decrease in blood  glucose (hypoglycemia), especially if you use insulin or take certain oral diabetes medicines. Hypoglycemia can be a life-threatening condition. Symptoms of hypoglycemia (sleepiness, dizziness, and confusion) are similar to symptoms of having too much alcohol. If your health care provider says that alcohol is safe for you, follow these guidelines:  Limit alcohol intake to no more than 1 drink per day for nonpregnant women and 2 drinks per day for men. One drink equals 12 oz of beer, 5 oz of wine, or 1 oz of hard liquor.  Do not drink on an empty stomach.  Keep yourself hydrated with water, diet soda, or unsweetened iced tea.  Keep in mind that regular soda, juice, and other mixers may contain a lot of sugar and must be counted as carbohydrates.  What are tips for following this plan? Reading food labels  Start by checking the serving size on the label. The amount of calories, carbohydrates, fats, and other nutrients listed on the label are based on one serving of the food. Many foods contain more than one serving per package.  Check the total grams (g) of carbohydrates in one serving. You can calculate the number of servings of carbohydrates in one serving by dividing the total carbohydrates by 15. For example, if a food has 30 g of total carbohydrates, it would be equal to 2 servings of carbohydrates.  Check the number of grams (g) of saturated and trans fats in one serving. Choose foods that have low or no amount of these fats.  Check the number of milligrams (mg) of sodium in one serving. Most people  should limit total sodium intake to less than 2,300 mg per day.  Always check the nutrition information of foods labeled as "low-fat" or "nonfat". These foods may be higher in added sugar or refined carbohydrates and should be avoided.  Talk to your dietitian to identify your daily goals for nutrients listed on the label. Shopping  Avoid buying canned, premade, or processed foods. These  foods tend to be high in fat, sodium, and added sugar.  Shop around the outside edge of the grocery store. This includes fresh fruits and vegetables, bulk grains, fresh meats, and fresh dairy. Cooking  Use low-heat cooking methods, such as baking, instead of high-heat cooking methods like deep frying.  Cook using healthy oils, such as olive, canola, or sunflower oil.  Avoid cooking with butter, cream, or high-fat meats. Meal planning  Eat meals and snacks regularly, preferably at the same times every day. Avoid going long periods of time without eating.  Eat foods high in fiber, such as fresh fruits, vegetables, beans, and whole grains. Talk to your dietitian about how many servings of carbohydrates you can eat at each meal.  Eat 4-6 ounces of lean protein each day, such as lean meat, chicken, fish, eggs, or tofu. 1 ounce is equal to 1 ounce of meat, chicken, or fish, 1 egg, or 1/4 cup of tofu.  Eat some foods each day that contain healthy fats, such as avocado, nuts, seeds, and fish. Lifestyle   Check your blood glucose regularly.  Exercise at least 30 minutes 5 or more days each week, or as told by your health care provider.  Take medicines as told by your health care provider.  Do not use any products that contain nicotine or tobacco, such as cigarettes and e-cigarettes. If you need help quitting, ask your health care provider.  Work with a Social worker or diabetes educator to identify strategies to manage stress and any emotional and social challenges. What are some questions to ask my health care provider?  Do I need to meet with a diabetes educator?  Do I need to meet with a dietitian?  What number can I call if I have questions?  When are the best times to check my blood glucose? Where to find more information:  American Diabetes Association: diabetes.org/food-and-fitness/food  Academy of Nutrition and Dietetics:  PokerClues.dk  [employer] of Diabetes and Digestive and Kidney Diseases (NIH): ContactWire.be Summary  A healthy meal plan will help you control your blood glucose and maintain a healthy lifestyle.  Working with a diet and nutrition specialist (dietitian) can help you make a meal plan that is best for you.  Keep in mind that carbohydrates and alcohol have immediate effects on your blood glucose levels. It is important to count carbohydrates and to use alcohol carefully. This information is not intended to replace advice given to you by your health care provider. Make sure you discuss any questions you have with your health care provider. Document Released: 02/17/2005 Document Revised: 06/27/2016 Document Reviewed: 06/27/2016 Elsevier Interactive Patient Education  2018 [employer]. Vaginal Delivery Vaginal delivery means that you will give birth by pushing your baby out of your birth canal (vagina). A team of health care providers will help you before, during, and after vaginal delivery. Birth experiences are unique for every woman and every pregnancy, and birth experiences vary depending on where you choose to give birth. What should I do to prepare for my baby's birth? Before your baby is born, it is important to talk  with your health care provider about:  Your labor and delivery preferences. These may include: ? Medicines that you may be given. ? How you will manage your pain. This might include non-medical pain relief techniques or injectable pain relief such as epidural analgesia. ? How you and your baby will be monitored during labor and delivery. ? Who may be in the labor and delivery room with you. ? Your feelings about surgical delivery of your baby (cesarean delivery, or C-section) if this becomes necessary. ? Your feelings about receiving donated  blood through an IV tube (blood transfusion) if this becomes necessary.  Whether you are able: ? To take pictures or videos of the birth. ? To eat during labor and delivery. ? To move around, walk, or change positions during labor and delivery.  What to expect after your baby is born, such as: ? Whether delayed umbilical cord clamping and cutting is offered. ? Who will care for your baby right after birth. ? Medicines or tests that may be recommended for your baby. ? Whether breastfeeding is supported in your hospital or birth center. ? How long you will be in the hospital or birth center.  How any medical conditions you have may affect your baby or your labor and delivery experience.  To prepare for your baby's birth, you should also:  Attend all of your health care visits before delivery (prenatal visits) as recommended by your health care provider. This is important.  Prepare your home for your baby's arrival. Make sure that you have: ? Diapers. ? Baby clothing. ? Feeding equipment. ? Safe sleeping arrangements for you and your baby.  Install a car seat in your vehicle. Have your car seat checked by a certified car seat installer to make sure that it is installed safely.  Think about who will help you with your new baby at home for at least the first several weeks after delivery.  This information is not intended to replace advice given to you by your health care provider. Make sure you discuss any questions you have with your health care provider. Document Released: 03/01/2008 Document Revised: 12/11/2015 Document Reviewed: 06/07/2015 Elsevier Interactive Patient Education  2018 [employer]. Suicidal Feelings: How to Help Yourself Suicide is the taking of one's own life. If you feel as though life is getting too tough to handle and are thinking about suicide, get help right away. To get help:  Call your local emergency services (911 in the [place]  Call a suicide hotline to  speak with a trained counselor who understands how you are feeling. The following is a list of suicide hotlines in the [place]. For a list of hotlines in [place], visit FindSkins.pl. ? 1-800-273-TALK 631-032-0804). ? 1-800-SUICIDE (319) 739-1730). ? (989) 149-3647. This is a hotline for Spanish speakers. ? 1-800-799-4TTY 678-764-6304). This is a hotline for TTY users. ? 1-866-4-U-TREVOR 364-452-6387). This is a hotline for lesbian, gay, bisexual, transgender, or questioning youth.  Contact a crisis center or a local suicide prevention center. To find a crisis center or suicide prevention center: ? Call your local hospital, clinic, community service organization, mental health center, social service provider, or health department. Ask for assistance in connecting to a crisis center. ? Visit BankingRep.com.au for a list of crisis centers in the [place], or visit www.suicideprevention.ca/thinking-about-suicide/find-a-crisis-centre for a list of centers in [place].  Visit the following websites: ? National Suicide Prevention Lifeline: www.suicidepreventionlifeline.org ? Hopeline: www.hopeline.com ? [employer] for Suicide Prevention: PromotionalLoans.co.za ? [employer] (for  lesbian, gay, bisexual, transgender, or questioning youth): www.thetrevorproject.org  How can I help myself feel better?  Promise yourself that you will not do anything drastic when you have suicidal feelings. Remember, there is hope. Many people have gotten through suicidal thoughts and feelings, and you will, too. You may have gotten through them before, and this proves that you can get through them again.  Let family, friends, teachers, or counselors know how you are feeling. Try not to isolate yourself from those who care about you. Remember, they will want to help you. Talk with someone every day, even if you  do not feel sociable. Face-to-face conversation is best.  Call a mental health professional and see one regularly.  Visit your primary health care provider every year.  Eat a well-balanced diet, and space your meals so you eat regularly.  Get plenty of rest.  Avoid alcohol and drugs, and remove them from your home. They will only make you feel worse.  If you are thinking of taking a lot of medicine, give your medicine to someone who can give it to you one day at a time. If you are on antidepressants and are concerned you will overdose, let your health care provider know so he or she can give you safer medicines. Ask your mental health professional about the possible side effects of any medicines you are taking.  Remove weapons, poisons, knives, and anything else that could harm you from your home.  Try to stick to routines. Follow a schedule every day. Put self-care on your schedule.  Make a list of realistic goals, and cross them off when you achieve them. Accomplishments give a sense of worth.  Wait until you are feeling better before doing the things you find difficult or unpleasant.  Exercise if you are able. You will feel better if you exercise for even a half hour each day.  Go out in the sun or into nature. This will help you recover from depression faster. If you have a favorite place to walk, go there.  Do the things that have always given you pleasure. Play your favorite music, read a good book, paint a picture, play your favorite instrument, or do anything else that takes your mind off your depression if it is safe to do.  Keep your living space well lit.  When you are feeling well, write yourself a letter about tips and support that you can read when you are not feeling well.  Remember that lifes difficulties can be sorted out with help. Conditions can be treated. You can work on thoughts and strategies that serve you well. This information is not intended to replace  advice given to you by your health care provider. Make sure you discuss any questions you have with your health care provider. Document Released: 11/27/2002 Document Revised: 01/20/2016 Document Reviewed: 09/17/2013 Elsevier Interactive Patient Education  2018 [employer]. Generalized Anxiety Disorder, Adult Generalized anxiety disorder (GAD) is a mental health disorder. People with this condition constantly worry about everyday events. Unlike normal anxiety, worry related to GAD is not triggered by a specific event. These worries also do not fade or get better with time. GAD interferes with life functions, including relationships, work, and school. GAD can vary from mild to severe. People with severe GAD can have intense waves of anxiety with physical symptoms (panic attacks). What are the causes? The exact cause of GAD is not known. What increases the risk? This condition is more likely to develop in:  Women.  People who have a family history of anxiety disorders.  People who are very shy.  People who experience very stressful life events, such as the death of a loved one.  People who have a very stressful family environment.  What are the signs or symptoms? People with GAD often worry excessively about many things in their lives, such as their health and family. They may also be overly concerned about:  Doing well at work.  Being on time.  Natural disasters.  Friendships.  Physical symptoms of GAD include:  Fatigue.  Muscle tension or having muscle twitches.  Trembling or feeling shaky.  Being easily startled.  Feeling like your heart is pounding or racing.  Feeling out of breath or like you cannot take a deep breath.  Having trouble falling asleep or staying asleep.  Sweating.  Nausea, diarrhea, or irritable bowel syndrome (IBS).  Headaches.  Trouble concentrating or remembering facts.  Restlessness.  Irritability.  How is this diagnosed? Your health  care provider can diagnose GAD based on your symptoms and medical history. You will also have a physical exam. The health care provider will ask specific questions about your symptoms, including how severe they are, when they started, and if they come and go. Your health care provider may ask you about your use of alcohol or drugs, including prescription medicines. Your health care provider may refer you to a mental health specialist for further evaluation. Your health care provider will do a thorough examination and may perform additional tests to rule out other possible causes of your symptoms. To be diagnosed with GAD, a person must have anxiety that:  Is out of his or her control.  Affects several different aspects of his or her life, such as work and relationships.  Causes distress that makes him or her unable to take part in normal activities.  Includes at least three physical symptoms of GAD, such as restlessness, fatigue, trouble concentrating, irritability, muscle tension, or sleep problems.  Before your health care provider can confirm a diagnosis of GAD, these symptoms must be present more days than they are not, and they must last for six months or longer. How is this treated? The following therapies are usually used to treat GAD:  Medicine. Antidepressant medicine is usually prescribed for long-term daily control. Antianxiety medicines may be added in severe cases, especially when panic attacks occur.  Talk therapy (psychotherapy). Certain types of talk therapy can be helpful in treating GAD by providing support, education, and guidance. Options include: ? Cognitive behavioral therapy (CBT). People learn coping skills and techniques to ease their anxiety. They learn to identify unrealistic or negative thoughts and behaviors and to replace them with positive ones. ? Acceptance and commitment therapy (ACT). This treatment teaches people how to be mindful as a way to cope with unwanted  thoughts and feelings. ? Biofeedback. This process trains you to manage your body's response (physiological response) through breathing techniques and relaxation methods. You will work with a therapist while machines are used to monitor your physical symptoms.  Stress management techniques. These include yoga, meditation, and exercise.  A mental health specialist can help determine which treatment is best for you. Some people see improvement with one type of therapy. However, other people require a combination of therapies. Follow these instructions at home:  Take over-the-counter and prescription medicines only as told by your health care provider.  Try to maintain a normal routine.  Try to anticipate stressful situations and allow extra  time to manage them.  Practice any stress management or self-calming techniques as taught by your health care provider.  Do not punish yourself for setbacks or for not making progress.  Try to recognize your accomplishments, even if they are small.  Keep all follow-up visits as told by your health care provider. This is important. Contact a health care provider if:  Your symptoms do not get better.  Your symptoms get worse.  You have signs of depression, such as: ? A persistently sad, cranky, or irritable mood. ? Loss of enjoyment in activities that used to bring you joy. ? Change in weight or eating. ? Changes in sleeping habits. ? Avoiding friends or family members. ? Loss of energy for normal tasks. ? Feelings of guilt or worthlessness. Get help right away if:  You have serious thoughts about hurting yourself or others. If you ever feel like you may hurt yourself or others, or have thoughts about taking your own life, get help right away. You can go to your nearest emergency department or call:  Your local emergency services (911 in the [place]  A suicide crisis helpline, such as the [place] at 646-765-3147.  This is open 24 hours a day.  Summary  Generalized anxiety disorder (GAD) is a mental health disorder that involves worry that is not triggered by a specific event.  People with GAD often worry excessively about many things in their lives, such as their health and family.  GAD may cause physical symptoms such as restlessness, trouble concentrating, sleep problems, frequent sweating, nausea, diarrhea, headaches, and trembling or muscle twitching.  A mental health specialist can help determine which treatment is best for you. Some people see improvement with one type of therapy. However, other people require a combination of therapies. This information is not intended to replace advice given to you by your health care provider. Make sure you discuss any questions you have with your health care provider. Document Released: 09/17/2012 Document Revised: 04/12/2016 Document Reviewed: 04/12/2016 Elsevier Interactive Patient Education  2018 [employer]. Anemia Anemia is a condition in which you do not have enough red blood cells or hemoglobin. Hemoglobin is a substance in red blood cells that carries oxygen. When you do not have enough red blood cells or hemoglobin (are anemic), your body cannot get enough oxygen and your organs may not work properly. As a result, you may feel very tired or have other problems. What are the causes? Common causes of anemia include:  Excessive bleeding. Anemia can be caused by excessive bleeding inside or outside the body, including bleeding from the intestine or from periods in women.  Poor nutrition.  Long-lasting (chronic) kidney, thyroid, and liver disease.  Bone marrow disorders.  Cancer and treatments for cancer.  HIV (human immunodeficiency virus) and AIDS (acquired immunodeficiency syndrome).  Treatments for HIV and AIDS.  Spleen problems.  Blood disorders.  Infections, medicines, and autoimmune disorders that destroy red blood cells.  What are  the signs or symptoms? Symptoms of this condition include:  Minor weakness.  Dizziness.  Headache.  Feeling heartbeats that are irregular or faster than normal (palpitations).  Shortness of breath, especially with exercise.  Paleness.  Cold sensitivity.  Indigestion.  Nausea.  Difficulty sleeping.  Difficulty concentrating.  Symptoms may occur suddenly or develop slowly. If your anemia is mild, you may not have symptoms. How is this diagnosed? This condition is diagnosed based on:  Blood tests.  Your medical history.  A physical exam.  Bone marrow biopsy.  Your health care provider may also check your stool (feces) for blood and may do additional testing to look for the cause of your bleeding. You may also have other tests, including:  Imaging tests, such as a CT scan or MRI.  Endoscopy.  Colonoscopy.  How is this treated? Treatment for this condition depends on the cause. If you continue to lose a lot of blood, you may need to be treated at a hospital. Treatment may include:  Taking supplements of iron, vitamin O27, or folic acid.  Taking a hormone medicine (erythropoietin) that can help to stimulate red blood cell growth.  Having a blood transfusion. This may be needed if you lose a lot of blood.  Making changes to your diet.  Having surgery to remove your spleen.  Follow these instructions at home:  Take over-the-counter and prescription medicines only as told by your health care provider.  Take supplements only as told by your health care provider.  Follow any diet instructions that you were given.  Keep all follow-up visits as told by your health care provider. This is important. Contact a health care provider if:  You develop new bleeding anywhere in the body. Get help right away if:  You are very weak.  You are short of breath.  You have pain in your abdomen or chest.  You are dizzy or feel faint.  You have trouble  concentrating.  You have bloody or black, tarry stools.  You vomit repeatedly or you vomit up blood. Summary  Anemia is a condition in which you do not have enough red blood cells or enough of a substance in your red blood cells that carries oxygen (hemoglobin).  Symptoms may occur suddenly or develop slowly.  If your anemia is mild, you may not have symptoms.  This condition is diagnosed with blood tests as well as a medical history and physical exam. Other tests may be needed.  Treatment for this condition depends on the cause of the anemia. This information is not intended to replace advice given to you by your health care provider. Make sure you discuss any questions you have with your health care provider. Document Released: 06/30/2004 Document Revised: 06/24/2016 Document Reviewed: 06/24/2016 Elsevier Interactive Patient Education  [employer]. If patient begins to have depression Signs and symptoms, please call for appointment right away at [facility].

## 2017-10-18 NOTE — Progress Notes (Signed)
Patient refuses to start Effexor at this time. Supported patient decision but educated patient if she starts have increased feelings of anxiety or depression to call the office. Patient verbalized understanding. Glenis Smoker CNM aware.

## 2018-01-10 DIAGNOSIS — R110 Nausea: Secondary | ICD-10-CM

## 2018-01-10 DIAGNOSIS — R1030 Lower abdominal pain, unspecified: Principal | ICD-10-CM

## 2018-01-10 DIAGNOSIS — R1010 Upper abdominal pain, unspecified: Secondary | ICD-10-CM

## 2018-01-10 MED ORDER — ONDANSETRON 4 MG DISINTEGRATING TABLET
ORAL_TABLET | Freq: Three times a day (TID) | ORAL | 0 refills | 0 days | Status: CP | PRN
Start: 2018-01-10 — End: 2018-01-17

## 2018-01-11 ENCOUNTER — Encounter
Admit: 2018-01-11 | Discharge: 2018-01-11 | Disposition: A | Discharge status: Home / Self Care | Payer: BLUE CROSS/BLUE SHIELD | Attending: Emergency Medicine

## 2018-01-11 ENCOUNTER — Encounter
Admit: 2018-01-11 | Discharge: 2018-01-12 | Payer: BLUE CROSS/BLUE SHIELD | Attending: Student in an Organized Health Care Education/Training Program | Primary: Student in an Organized Health Care Education/Training Program

## 2018-01-11 DIAGNOSIS — R1030 Lower abdominal pain, unspecified: Principal | ICD-10-CM

## 2018-01-11 MED ORDER — GABAPENTIN 300 MG CAPSULE
ORAL_CAPSULE | Freq: Three times a day (TID) | ORAL | 0 refills | 0.00000 days | Status: CP
Start: 2018-01-11 — End: 2018-03-19

## 2018-01-11 MED ORDER — GABAPENTIN 300 MG CAPSULE: 300 mg | capsule | Freq: Three times a day (TID) | 0 refills | 0 days | Status: AC

## 2018-01-11 MED ORDER — VENLAFAXINE ER 75 MG CAPSULE,EXTENDED RELEASE 24 HR: 75 mg | capsule | Freq: Every day | 0 refills | 0 days | Status: AC

## 2018-01-11 MED ORDER — VENLAFAXINE ER 75 MG CAPSULE,EXTENDED RELEASE 24 HR
ORAL_CAPSULE | Freq: Every day | ORAL | 0 refills | 0.00000 days | Status: CP
Start: 2018-01-11 — End: 2018-04-03

## 2018-01-24 ENCOUNTER — Encounter: Admit: 2018-01-24 | Discharge: 2018-01-25 | Payer: BLUE CROSS/BLUE SHIELD

## 2018-01-24 DIAGNOSIS — M25551 Pain in right hip: Principal | ICD-10-CM

## 2018-01-24 DIAGNOSIS — M25552 Pain in left hip: Secondary | ICD-10-CM

## 2018-01-24 DIAGNOSIS — M25562 Pain in left knee: Secondary | ICD-10-CM

## 2018-01-24 DIAGNOSIS — M7061 Trochanteric bursitis, right hip: Secondary | ICD-10-CM

## 2018-02-13 MED ORDER — ZOLMITRIPTAN 5 MG DISINTEGRATING TABLET
ORAL_TABLET | 11 refills | 0 days | Status: CP
Start: 2018-02-13 — End: 2018-06-04
  Filled 2018-02-14: qty 10, 5d supply, fill #0

## 2018-02-13 MED ORDER — PROMETHAZINE 25 MG TABLET
ORAL_TABLET | Freq: Four times a day (QID) | ORAL | 3 refills | 0.00000 days | Status: CP | PRN
Start: 2018-02-13 — End: 2018-05-30
  Filled 2018-02-14: qty 30, 8d supply, fill #0

## 2018-02-13 MED ORDER — ONDANSETRON 8 MG DISINTEGRATING TABLET
ORAL_TABLET | Freq: Three times a day (TID) | ORAL | 3 refills | 0.00000 days | Status: CP | PRN
Start: 2018-02-13 — End: 2018-05-28
  Filled 2018-02-14: qty 18, 6d supply, fill #0

## 2018-02-14 MED FILL — ONDANSETRON 8 MG DISINTEGRATING TABLET: 6 days supply | Qty: 18 | Fill #0 | Status: AC

## 2018-02-14 MED FILL — PROMETHAZINE 25 MG TABLET: 8 days supply | Qty: 30 | Fill #0 | Status: AC

## 2018-02-14 MED FILL — ZOLMITRIPTAN 5 MG DISINTEGRATING TABLET: 5 days supply | Qty: 10 | Fill #0 | Status: AC

## 2018-03-05 ENCOUNTER — Ambulatory Visit: Admit: 2018-03-05 | Discharge: 2018-03-06 | Payer: BLUE CROSS/BLUE SHIELD | Attending: Neurology | Primary: Neurology

## 2018-03-05 DIAGNOSIS — M5481 Occipital neuralgia: Secondary | ICD-10-CM

## 2018-03-05 DIAGNOSIS — Z87442 Personal history of urinary calculi: Principal | ICD-10-CM

## 2018-03-05 DIAGNOSIS — Z8249 Family history of ischemic heart disease and other diseases of the circulatory system: Secondary | ICD-10-CM

## 2018-03-05 DIAGNOSIS — G43709 Chronic migraine without aura, not intractable, without status migrainosus: Secondary | ICD-10-CM

## 2018-03-05 MED ORDER — EMPTY CONTAINER
PRN refills | 0 days
Start: 2018-03-05 — End: ?

## 2018-03-05 MED ORDER — RIZATRIPTAN 10 MG DISINTEGRATING TABLET
ORAL_TABLET | 11 refills | 0 days | Status: CP
Start: 2018-03-05 — End: 2018-06-04
  Filled 2018-03-05: qty 12, 20d supply, fill #0

## 2018-03-05 MED ORDER — GALCANEZUMAB-GNLM 120 MG/ML SUBCUTANEOUS PEN INJECTOR
Freq: Once | SUBCUTANEOUS | 0 refills | 0.00000 days | Status: CP
Start: 2018-03-05 — End: 2018-04-26
  Filled 2018-03-08: qty 2, 1d supply, fill #0

## 2018-03-05 MED FILL — RIZATRIPTAN 10 MG DISINTEGRATING TABLET: 20 days supply | Qty: 12 | Fill #0 | Status: AC

## 2018-03-05 NOTE — Unmapped (Addendum)
You received bilateral occipital nerve blocks today.     I have asked Renal to weigh in on Topiramate and your kidney stones. E-Consult: I placed an electronic consult (e-Consult) and will let you know when I receive the results. As we discussed, this may be associated with an out-of-pocket expense.    [email or web address] has more information for you about medication.     Please help [place] find your medical records about kidney stone.         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  This means it is easier for you to be alert, and any factors that trigger more alertness, can make it harder for you to sleep.  Because of this we need to be very careful to avoid factors that can be stimulating and can cause higher arousal.  People with this type of insomnia, can sometimes get into habits which they are using to protect their sleep, but at the same time these actually perpetuate the insomnia.  We will try to help you avoid these perpetuating factors as well.     Our insomnia treatment is focused on helping your own body???s natural sleep rhythms become stronger.  The first step is to eliminate factors that could be stimulating and interfering with your natural body rhythms, the second step is to help align these body rhythms so that they are more in sync with the times you need to be in bed or sleeping.    There are two major internal body rhythms that affect sleep.  These are: the homeostatic sleep drive (this is the sleep drive that builds up over the day to make you more tired at night); and the circadian rhythm (this rhythm has peaks and valleys of sleepiness during the day and night).      One of the main tools we use to synchronize the body rhythms is to attempt to consolidate your sleep time into a shorter, but more focused time window. This can help ???kick??? your natural body rhythms into gear and strengthen them.    If you have underlying idiopathic or long standing insomnia, it is unlikely that you will be able to sleep like someone who has always been a good sleeper even with this treatment.      Setting your expectations: You need to keep in mind that people have different natural sleep abilities, and some people always sleep well, but there are also very many people like you who have to be more careful about their sleep.   Although you may not be able to be able to sleep as well as some ???great??? sleeper you might know, we think you can make considerable progress by following these measures.  The goal is to make steps in the right direction.    One thing we want to emphasize is that you cannot ???make yourself go to sleep.???  But if you follow a comprehensive program which we will try to outline for you, we hope your body rhythms will work more to your advantage.  It is your natural internal body mechanisms that trigger sleep.  If you follow these steps, it will be more likely for these to be working at the right time and more successfully.    ELIMINATION OF SIMULANTS THAT PROMOTE AROUSAL    1. CAFFEINE. First, we work on eliminating all substances that can trigger  wakefulness including all caffeine.  Caffeine consumed in the morning can have effects for 24 hours in people with insomnia.  Sources of caffeine include, tea, iced tea, green tea, sweet tea, soda, diet sodas, coffee, decaffeinated coffee, and chocolate.  We recommend eliminating all of these. With respect to chocolate, all chocolate should be eliminated after noon.    2. MEDICATIONS. We will review your medications / supplements and make sure there are not any medications that could be stimulating alertness.   Sometimes we may recommend tapering off certain medications or supplements, and sometimes we recommend moving your doses to the morning. If you are taking vitamins or any medications that do not need to be dosed at night, it is better to move them to the morning and away from bedtime.  Only take medications that have to be taken at night at that time.    3. DINNER AND SNACKS. We recommend avoiding food and fluids in the three hours prior to bedtime.  This helps people with insomnia allow their body to ???wind down??? better from a physiological level prior to sleep.  It is not natural to fill your body with food for energy, right before you go to sleep. Your body needs to clear food into the digestive system before you lie down. You should think about ensuring that your dinner is complete, and you stop snacking at by 3 hours prior to your bedtime.  You should also refrain from eating during the night since this is a factor that can alter your circadian rhythm and make the body start to feel it should be awake at that time every night.    4. ALCOHOL. For the initial part of the program, alcohol should preferably also be tapered off and eliminated, since this can cause insomnia, especially in the middle and latter parts of the night.    5. LIGHTS AT NIGHT. Avoid bright lights in the evenings.  The television should also not be in the same room as your bedroom and you should not watch television or use computers in your bedroom as these have lights sources that can change your circadian rhythm.    6. ELECTRONICS. Television and computers and electronic devices, including smart phones should be turned off at least half an hour prior to bedtime, and should only be used outside the bedroom.    7. EXERCISE. Exercise is good for sleep, but should be completed preferably prior to 3pm if possible.  Daily walking of at least 20 minutes or more can be helpful for sleep if your ability to exercise is otherwise limited.    8. INDIVIDUAL PHYSIOLOGICAL AND MEDICAL FACTORS. There may be other factors stimulating arousal tendency at night, and we will review your personal history to see if restless leg syndrome, pain, leg cramps, nasal congestion, reflux, snoring or breathing factors might also be interfering with sleep ability.  We will also review environmental factors such as temperature, ambient noise, and pets.     This is a list of other factors we identified:      ______________________     ______________________     ______________________     ______________________     ______________________     ______________________     ______________________     ______________________      SLEEP CONSOLIDATION and SLEEP TIMING    1. SLEEP CONSOLIDATION.  We chose a bedtime and wake time for you based on your underlying sleep need and whether you are a shorter sleeper  naturally, longer sleeper naturally, or middle range sleeper.    We will set your bedtime and you are not supposed to go to bed earlier than that time, but you can go to bed later.  Remember you can go to bed later than this time, just not earlier.      Your bedtime is: ______________    One of the most important things is your wake time.  You should set your wake time and keep this time very strictly.   You are allowed to get up before your wake time, but not after your wake time.       Your wake time is: ________________    Remember ??? even if you have a bad night ??? you have to stay up at least until your bedtime the next night.  Also remember, even if you have a bad night, you are not allowed to sleep in late after your wake time.  You can stay up later, and you can get up earlier, but you cannot go to bed earlier, or get up later.  You have to stay inside  the range that we specified for you.    2.   CLOCK.  A very important thing is not to check the clock during the night.  This means not checking the phone at night to see what time it is, or computers or watches, or clocks.  Clocks should not be visible at night in your bedroom and computers, and smart phones put away. Thinking about time in the middle of the night will trigger different processing in your brain that is not the same paths or thinking patterns that are engaged with sleeping. If you are worried you are not going to wake up in the morning on time, then just make sure you have an alarm clock, so you do not have to check the clock.    3. RELAXING AND DOZING IN BED IS FINE. When you are in bed and awake at night ??? it is fine to doze or daydream in bed, as long as you are relaxed, and you are following the bedtimes and wake times we set for you.  If you are relaxed in bed you often are getting some restorative benefit that will actually be helping you.    4. IF YOU START TO GET TENSE OR FRUSTRATED.  If you start to feel worried or anxious, or frustrated, when you are lying in bed, then you should leave the bedroom and go to another quiet dimly lit room and read until you are tired and can return back to bed.  Remember you cannot make yourself fall asleep, so it is not your responsibility, but if you stay with this program, we think your natural body rhythms will kick in.  Remember it is fine to relax and doze as long as you are feeling comfortable, and  you can stay in bed. But if you are getting tense, though, that is when you should leave the bedroom.  You can prepare a quiet and dimly lit room to go to if you often wake up tense at night, so this is ready for you if you wake up.    4. REMEMBER IT CAN STILL BE A GOOD DAY.  Remember that if you are having a bad night, the next day may be very good.  The night time does not necessarily correlate with how good the day is.  You may have had this experience many times if you  have chronic insomnia.  So if you are awake during the night, remember not to get anxious about the following day.  Also remember you get some restorative benefit from just dozing.  You can also remember that it is easier to get anxious during the night, but that it is not always for good reason.  Overall, if you have more trouble one night, remember that the next day could still be a very good day.    5. NAPS.  As you get started on the strict wake time and later bedtime we have set, you may feel the desire to take a nap.  For people with insomnia, naps can be very powerful disrupters of your sleep drive, essentially ???sucking up??? your sleep drive, so at night it can be much harder to fall asleep and this will set you back in terms of getting into the right rhythm.   We recommend  avoiding naps completely if possible.  But if it is absolutely necessary, you can take a short nap, but it should limit it to 20 minutes.  You should set your alarm for 20 minutes so you make sure you get up after that.  This should provide you with a  short bit of rest.  This 20 minute nap should  preferably be taken before noon, and if this is not possible, before 2 pm.  If you are feeling really tired, remember, that it might actually help you at night, and you probably will have a somewhat better night sleeping  that will help reinforce your nighttime body rhythms in the end.    RECORD KEEPING    1. Sleep Diary.  As part of this trial period we would also like you to keep a sleep diary. YOU DO NOT NEED TO WRITE DOWN YOUR MEDICATION AND DAYTIME ACITIVITIES, JUST SHADE IN THE BOXES THAT INDICATE APPROXIMATELY WHEN YOU WERE SLEEPING.  This should just be filled out the next day sometime in the middle of the day.  You do not need the exact times, just some ballpark guesses, to estimate how your sleep was the night before.  Also shade in any naps you might have taken.  Remember it is just an overall guess of about what your sleep was like the night before (you don't need to be looking at the clock in the middle of the night!), and you fill this out each day. You can bring this to your next appointment so we can review it.     We can monitor how you are doing and make adjustments at that time.       Remember you cannot make yourself go to sleep, so this is all about following some of these rules and then hopefully that will help you with sleep continuity from your natural rhythms      Warmest Regards,    Dr. Hollie Beach  Board Certified: Adult Neurology  [facility] Clinical Epidemiology  [place], [place]      It has been my pleasure to participate in your care.   You may reach me via MyChart, phone or fax.     Please note that while I will try to answer messages in a timely manner, all urgent issues should be directed to your PCP.       Helpful Contact Information :  ?? Appointments/ Internal Medicine Clinic: [phone]    ?? HIPPA Compliant Facsimile: [phone]  ?? Same Day Clinic:  [phone].  8am-5pm Monday through Friday   ?? After Hours:  [phone].  A nurse will answer and can help you decide what kind of medication attention you need.   ?? [facility]:  [phone].  If you are sick, but not injured:  9am-8pm Mon-Sun.  [address], [place], [place] off of [address].  All walk-ins accepted.    ?? [facility] at the [facility]:  [phone].  7am-9pm Mon-Fri; 12pm-5pm [address]  [address], [place] [address].  All walk-ins accepted.    ??    Thank you for choosing [facility] [employer].

## 2018-03-05 NOTE — Unmapped (Signed)
[facility] Medication Referral: No PA required    Medication (Brand/Generic): [facility]    Initial Benefits Investigation Claim completed with resulted information below:  No PA required  Patient ABLE to fill at [facility]  Insurance Company:  [employer]  Anticipated Copay: $0    As Co-pay is under $25 defined limit, per policy there will be no further investigation of need for financial assistance at this time unless patient requests. This referral has been communicated to the provider and handed off to the [facility] team for further processing and filling of prescribed medication.   ______________________________________________________________________  Please utilize this referral for viewing purposes as it will serve as the central location for all relevant documentation and updates.

## 2018-03-05 NOTE — Unmapped (Signed)
NEUROLOGY REPORT    [facility]  [facility] [place]  [address] [place] [address]  [phone]    Date: March 05, 2018  Patient Name: Amber Carson  MRN: [id number]  PCP:  [name]  Time in with patient: 12:40  PM  Time out with patient:: 1:11PM  Prior visit with this neurologist:     Assessment and Plan/ Clinical Decision Making          1.  Chronic migraine  Longstanding history of migraine, with resurgence post-partum (4 months) and no recent imaging.   Did well with occipital nerve block in the past.   Zomig not working for acute Rx- will switch to rizatriptan (Sumatriptan causes flushing).   - rizatriptan (MAXALT-MLT) 10 MG disintegrating tablet; May repeat in 2 hours if needed for migraine. NO more than 2 in 24 hours.  Dispense: 12 tablet; Refill: 11    This is an ESTABLISHED PROBLEM which is worsening    Will try to - Obtain medical records  Needs repeat imaging, given this is a NEW headache also has family history of father's brother with cerebral aneurysm- MRI Head Wo MRA Head Wo Contrast; Future  Also history of ?occluded vessel in neck?    Prophylaxis reviewed:   Topirate is preferred due to weigh loss potential- has tried it twice with good results BUT has had 2 episodes of nephrolithiasis.   Will request input from Renal re likely recurrence. Patient preference is for topiramate.   She agrees to [employer]  - Ambulatory e-Consult to Nephrology    In meanwhile- will try to obtain   - galcanezumab-gnlm (EMGALITY) 120 mg/mL injection; Inject 120 mg under the skin every thirty (30) days.  Dispense: 1 mL; Refill: 11  - galcanezumab-gnlm (EMGALITY) injection 240 mg- Inject 240 mg under the skin once. for 1 dose LOADING DOSE> to be administered in the clinic.  Dispense: 2 mL; Refill: 0    2.H/O renal calculi  As above.   - Obtain medical records  - MRI Head Wo MRA Head Wo Contrast; Future  - Ambulatory e-Consult to Nephrology  - galcanezumab-gnlm (EMGALITY) 120 mg/mL injection; Inject 240 mg under the skin once. for 1 dose LOADING DOSE> to be administered in the clinic.  Dispense: 2 mL; Refill: 0    3. Bilateral occipital neuralgia  This is a NEW PROBLEM   Severe- intractable. Great results in the past without complications.   See procedure notes below for bilateral nerve block.     - methylPREDNISolone acetate (DEPO-MEDROL) injection 80 mg  - bupivacaine (PF) (MARCAINE) 0.5 % (5 mg/mL) injection (PF) 15 mg      Headache triggers reviewed in detail.  Headache calendar- discussed- to bring at next visit. Migraine Buddy/iMigraine  Prophylaxis and Abortive therapy reviewed in detail: Importance of treatment early on is reviewed.   Sleep hygiene reviewed. Insomnia is a major ongoing issue. Reviewed in detail. See AVS.       HEALTH EDUCATION/HEALTH LITERACY: PATIENT EDUCATION was provided. Reviewed the differential diagnosis, plan of care in detail, as well as other elements as detailed above. The benefits and risks of each of the above procedures, benefits and alternative options were reviewed with the patient.     Medication Management: There is a high risk for medication side effects, which will require ongoing monitoring and dose adjustment. Medication side effect profile was reviewed in detail with patient. Medication safety and drug interactions reviewed on ERx.  Patient Counselling: All the patient's questions and concerns were addressed to their stated satisfaction .      Barriers to Care: None identified.     Total face-to-face time included: 40 minutes > 50 % in direct consultation reviewing details of history, examination and data findings, discussing my clinical reasoning and clinical impression, as well as a review of my recommendations for a plan of treatment as documented above.     Additional time was provided to address alternative options for care, health literacy regarding the differential diagnosis, testing and medication options, the benefits of current plan as well as an opportunity to address all the patient's questions and concerns to their reported satisfaction. A written summary was provided to the patient at the time of the visit, as well as instructions on how to access My Chart. My contact information was provided along with instructions on how to reach the administrative office and the clinic.     Return for Emgality injections, Followup of MRI results.         Subjective:          SOURCE: The history is obtained from the patient who appears to be a reliable historian. Additional history is obtained from review of available medical records. Open-ended and close-ended questions, as well as questionnaire data and review of EPIC chart was used to obtain details of the history.    History of Present Illness:     [name] is a 42 y.o. right handed female seen in consultation at the [facility] Department of General Internal Medicine Outpatient Clinics at the request of Dr. [name], MD for evaluation of No chief complaint on file.  .  Migraine headache for years- on and off has been on topiramate with excellent results. Back on topiramate in 2012 for worsening headache.   Has chronic insomnia.   Since baby born, headaches are severe, difficult to control and go on for a week at at time- has had current headache for 7 days.     Location: holocephalic  Severity: Affect ADLS- missing social life. Has to work (in healthcare)  Recent changes in frequency (Last 3 months): YES.   Associated Symptoms: Nausea, vomiting, photophobia, phonophobia    Number of headache free days in last 30 days: unclear- ? 14    Has tried the following  Abortive Therapy:   Analgesics/NSAIDS-  Triptans- Zomig not working    Prophylactic Therapy:  Will get records to review history.   Topiramate- works well- kidney stones X2- most recently off topiramate at start of current pregnancy about a year ago.       Other studies/data: ?? Sleep Study: yes  ?? Imaging: yes- CT scan many years ago. Some ? Occluded carotid?      Review of Systems: A 10-systems review was performed.Unless otherwise identified here, in HPI or by patient  no additional symptoms identified.     Past Medical History:   Diagnosis Date   ??? Carotid artery occlusion    ??? Depression    ??? Fibromyalgia    ??? Gall bladder disease    ??? GERD (gastroesophageal reflux disease)    ??? Migraine    ??? Peptic ulceration          Past Surgical History:   Procedure Laterality Date   ??? hemorhoidectomy     ??? SINUS SURGERY         Social History  Socioeconomic History   ??? Marital status: Single     Spouse name: Not on file   ??? Number of children: Not on file   ??? Years of education: Not on file   ??? Highest education level: Not on file   Occupational History   ??? Occupation: nurse     Comment: full time   Social Needs   ??? Financial resource strain: Not on file   ??? Food insecurity:     Worry: Not on file     Inability: Not on file   ??? Transportation needs:     Medical: Not on file     Non-medical: Not on file   Tobacco Use   ??? Smoking status: Former Smoker   ??? Smokeless tobacco: Former Estate agent and Sexual Activity   ??? Alcohol use: Yes   ??? Drug use: No   ??? Sexual activity: Not on file   Lifestyle   ??? Physical activity:     Days per week: Not on file     Minutes per session: Not on file   ??? Stress: Not on file   Relationships   ??? Social connections:     Talks on phone: Not on file     Gets together: Not on file     Attends religious service: Not on file     Active member of club or organization: Not on file     Attends meetings of clubs or organizations: Not on file     Relationship status: Not on file   Other Topics Concern   ??? Exercise Yes     Comment: 3x's a wk/yoga   ??? Living Situation Yes     Comment: husband   Social History Narrative   ??? Not on file       Social History     Tobacco Use   Smoking Status Former Smoker   Smokeless Tobacco Former User         reports that she drinks alcohol.      Family History   Problem Relation Age of Onset   ??? Sjogren's syndrome Mother    ??? Diabetes Mother    ??? Osteoporosis Mother    ??? Heart disease Father        No Known Allergies    Current Outpatient Medications   Medication Sig Dispense Refill   ??? cetirizine HCl (ZYRTEC ORAL) Take by mouth.     ??? cyclobenzaprine (FLEXERIL) 5 MG tablet Take 5 mg by mouth Three (3) times a day.     ??? fluticasone (FLONASE) 50 mcg/actuation nasal spray 1 spray by Each Nare route daily.     ??? gabapentin (NEURONTIN) 300 MG capsule TAKE 1 CAPSULE BY MOUTH 3 TIMES DAILY FOR 14 DAYS 42 capsule 0   ??? [START ON 04/04/2018] galcanezumab-gnlm (EMGALITY) 120 mg/mL injection Inject 120 mg under the skin every thirty (30) days. 1 mL 11   ??? galcanezumab-gnlm (EMGALITY) 120 mg/mL injection Inject 240 mg under the skin once. for 1 dose LOADING DOSE> to be administered in the clinic. 2 mL 0   ??? hydrocortisone (ANUSOL-HC) 25 mg suppository UNWRAP AND INSERT 1 SUPPOSITORY RECTALLY TWICE DAILY 20 suppository 2   ??? lorazepam (ATIVAN ORAL) Take by mouth.     ??? multivitamin (TAB-A-VITE/THERAGRAN) per tablet Take 1 tablet by mouth daily.     ??? pseudoephedrine HCl (SUDAFED ORAL) Take by mouth.     ??? ranitidine (ZANTAC) 150 MG capsule Take 150 mg  by mouth daily as needed for heartburn.     ??? rizatriptan (MAXALT-MLT) 10 MG disintegrating tablet May repeat in 2 hours if needed for migraine. NO more than 2 in 24 hours. 12 tablet 11   ??? traZODone (DESYREL) 50 MG tablet Take 1 tablet (50 mg total) by mouth nightly. 30 tablet 3   ??? venlafaxine (EFFEXOR-XR) 75 MG 24 hr capsule TAKE 1 CAPSULE BY MOUTH ONCE DAILY (Patient not taking: Reported on 01/24/2018) 30 capsule 0   ??? ZOLMitriptan (ZOMIG ZMT) 5 MG disintegrating tablet Take one as needed for headache. Repeat in 2 hours if needed for headache. No more than 2 doses in 24 hours. 10 tablet 11     Current Facility-Administered Medications   Medication Dose Route Frequency Provider Last Rate Last Dose   ??? galcanezumab-gnlm (EMGALITY) injection 240 mg  240 mg Subcutaneous Once [facility], MD       ??? triamcinolone acetonide (KENALOG-40) injection 80 mg  80 mg Intra-articular Once Cassell Smiles, [place]                Objective:        GENERAL PHYSICAL EXAM:    Vital Signs: Reviewed today.   There were no vitals taken for this visit.  Estimated body mass index is 34.33 kg/m?? as calculated from the following:    Height as of 01/24/18: 162.6 cm (5' 4).    Weight as of 01/24/18: 90.7 kg (200 lb).  No height and weight on file for this encounter.      Wt Readings from Last 8 Encounters:   01/24/18 90.7 kg (200 lb)   01/10/18 90.7 kg (200 lb)   07/13/17 100.4 kg (221 lb 5.5 oz)   03/28/17 96.2 kg (212 lb)   02/03/17 93 kg (205 lb)   01/30/17 95.5 kg (210 lb 9.6 oz)   01/17/17 96.2 kg (212 lb)   12/13/16 93.3 kg (205 lb 9.6 oz)       General Appearance: The patient was well-groomed and neat, engaged and cooperative.   No evidence of rashes, cyanosis, clubbing, jaundice, edema, petechiae or splinter hemorrhages.  Head:  Atraumatic, normocephalic.      NEUROLOGICAL EXAMINATION   Mental Status  The patient is alert and oriented to time, place and person.   Attention/concentration adequate throughout the examination.   Speech and language were normal at the bedside.  Memory was intact for history, short and long-term.   Affect is appropriate   Mood does not appear depressed.    Insight/Judgement: Appropriate.   Thought process: Logical, linear, clear, coherent, goal directed.   Thought content: Denies SI,HI, Self Harm, delusions, obsessions, paranoid ideation.        Motor / Musculoskeletal   No tremor. No fasciculations.    Gait: Normal gait and stance.   Romberg: is absent.       Diagnostic Studies and Review of Records:     DATA   I have independently reviewed the studies as noted.   1. Medical Records: reviewed  Radiology: Results reviewedCt Abdomen Pelvis W Iv Contrast Only    Result Date: 01/11/2018 EXAM: CT ABDOMEN PELVIS W CONTRAST DATE: 01/10/2018 10:05 PM ACCESSION: [id number] UN DICTATED: 01/10/2018 10:22 PM INTERPRETATION LOCATION: [facility]     CLINICAL INDICATION: ABDOMINAL PAIN, (specify site in comments)      COMPARISON: None     TECHNIQUE: A spiral CT scan of the abdomen and pelvis was obtained with IV contrast from the lung  bases through the pubic symphysis. Images were reconstructed in the axial plane. Coronal and sagittal reformatted images were also provided for further evaluation.     FINDINGS:     LOWER THORAX: Normal.     HEPATOBILIARY: No focal hepatic lesions. The gallbladder is surgically absent. No biliary dilatation.  SPLEEN: Unremarkable. PANCREAS: Unremarkable.     ADRENALS: Unremarkable. KIDNEYS/URETERS: Symmetric nephrograms. No hydronephrosis.     BLADDER: Decompressed. PELVIC/REPRODUCTIVE ORGANS: The ovaries are unremarkable. Mildly enlarged uterus, likely secondary to recent pregnancy.     GI TRACT: No dilated or thick walled loops of bowel. The appendix is unremarkable.     PERITONEUM/RETROPERITONEUM AND MESENTERY: No free air or fluid. Mild mesenteric fat stranding in the upper abdomen. LYMPH NODES: No enlarged lymph nodes. VESSELS: The aorta is normal in caliber.  No significant calcified atherosclerotic disease. The portal venous system is patent. The hepatic veins and IVC are unremarkable.     BONES AND SOFT TISSUES: Unremarkable.         Nonspecific mesenteric fat stranding in the upper abdomen.     No additional abnormality in the abdomen or pelvis.     ==================== ATTENDING ADDENDUM (01/11/2018 7:30 AM): On review, the following additional findings were noted: Small metallic/radiopaque densities are noted in the anorectal junction. Query prior hemorrhoid surgery.     The above addendum was communicated to the referring physician via Epic inbox messaging by Dr. Leandra Kern.  2.   Labs: reviewed   Lab Results   Component Value Date    WBC 8.3 01/10/2018    HGB 13.8 01/10/2018    HCT 41.7 01/10/2018    PLT 285 01/10/2018       Lab Results   Component Value Date    NA 139 01/10/2018    K 4.0 01/10/2018    CL 103 01/10/2018    CO2 22.0 01/10/2018    BUN 8 01/10/2018    CREATININE 0.49 (L) 01/10/2018    GLU 154 01/10/2018    CALCIUM 9.2 01/10/2018       Lab Results   Component Value Date    BILITOT 0.5 01/10/2018    PROT 7.5 01/10/2018    ALBUMIN 4.6 01/10/2018    ALT 34 01/10/2018    AST 21 01/10/2018    ALKPHOS 96 01/10/2018       No results found for: LABPROT, INR, APTT    Dr. Hollie Beach    CC: [name], MD    Dictated using voice-activated software. Typographical errors may exist.         OCCIPITAL NERVE BLOCK    Indication: Occipital Neuralgia   We have discussed repeating the occipital nerve block which the patient would like to pursue.   Prior to initiation of procedure, proper consent was obtained from the patient after discussing risks and benefits of the procedure.   Risks were including but not limited to infection and bleeding at local sites and distal to site of injections.   Patient agreed to the procedure after thorough discussion.    Using palpation and pain response, target areas in the suboccipital region, including the occipital nerve region bilaterally were identified. A timeout was performed immediately prior to the procedure, identifying patient, and location of injections.    Patient was kept in the sitting position, the occipital groove was properly identified on each side and pressure applied to determine whether reproduction of pain symptoms would occur on palpation. In this patient's case, applied pressure  reproduced faithfully the symptoms she described as painful.     After proper identification of the tract of the greater occipital nerve, the sites were thoroughly and carefully cleaned with alcohol swabs in usual fashion.    A solution containing 3cc of 0.5% marcaine, and 1.0 cc of DepoMedrol (80 mg) for a total of 4-cc was prepared in 2 2 ML syringes.     1 mL was applied at each of 1 site at the point of most pain, along the tract of the greater occipital nerve.    Procedure was performed bilaterally.    Patient tolerated procedure well. Minimal bleeding occurred which was easily stopped with applying local pressure. Pressure was applied to each of the injection sites with the intent of spreading the solution under the scalp.    Complications: There were no immediate complications from the procedure. Patient was informed that she should avoid washing site of injections for approximately 4 hours, and thereafter should maintain her hair clean in order to avoid local site infections.

## 2018-03-06 NOTE — Unmapped (Signed)
[facility]   Patient Onboarding/Medication Counseling    Ms.Kresta Templeman is a 42 y.o. female with chronic headache who I am counseling today on initiation of therapy.    Medication: Emgality 120mg /ml    Verified patient's date of birth / HIPAA.      Education Provided: ?    Dose/Administration discussed: Inject the contents of 2 pens under the skin as a loading dose, then use 1 pen under the skin monthly. This medication should be taken  without regard to food.  Stressed the importance of taking medication as prescribed and to contact provider if that changes at any time.  Discussed missed dose instructions.    Storage requirements: this medicine should be stored in the refrigerator.     Side effects / precautions discussed: Discussed common side effects, including but not limited to injection site reactions. If patient experiences signs/sympoms of an allergic reaction (hives/rash/itching, red/swollen/blistered/peeling skin, wheezing, tightness in chest/throat, difficultly breathing/swallowing/talking, unusual hoarseness, swelling of mouth/face/lips/tongue/throat, etc.) or shortness of breath, they need to call the doctor.  Patient will receive a drug information handout with shipment.    Handling precautions / disposal reviewed:  Patient will dispose of needles in a sharps container or empty laundry detergent bottle.    Drug Interactions: other medications reviewed and up to date in Epic.  No drug interactions identified.    Comorbidities/Allergies: reviewed and up to date in Epic.    Verified therapy is appropriate and should continue      Delivery Information    Medication Assistance provided: Prior Authorization and co pay assistance    Anticipated copay of $0 reviewed with patient. Verified delivery address in Epic.    Scheduled delivery date: 03/08/18 - loading dose sent to clinic    Explained that medication will be delivered by clinic courier. and this shipment will not require a signature.      Explained the services we provide at [facility] and that each month we would call to set up refills.  Stressed importance of returning phone calls so that we could ensure they receive their medications in time each month.  Informed patient that we should be setting up refills 7-10 days prior to when they will run out of medication.  Informed patient that welcome packet will be sent.      Patient verbalized understanding of the above information as well as how to contact the pharmacy at [phone] option 4 with any questions/concerns.  The pharmacy is open Monday through Friday 8:30am-4:30pm.  A pharmacist is available 24/7 via pager to answer any clinical questions they may have.        Patient Specific Needs      ? Patient has no physical, cognitive, or cultural barriers.    ? Patient prefers to have medications discussed with  Patient     ? Patient is able to read and understand education materials at a high school level or above.    ? Patient's primary language is  English           Lupita Shutter  [facility] Specialty Pharmacist

## 2018-03-06 NOTE — Unmapped (Signed)
Nephrology has been consulted to discuss risks of Topiramate therapy and kidney stones.     Topiramate is a well established risk factor for the development of nephrolithiasis. Although the absolute risk of stone formation is limited by lack of data it is estimated to be  3% or less. Topiramate is a carbonic anhydrase inhibitor and leads to both a proximal and distal renal tubular acidosis. From a proximal tubule standpoint it prevents bicarbonate reabsorption and from a distal tubule standpoint it can lead to decreased ability to excrete H+ in the form of ammonium. This results is a non anion gap metabolic acidosis. The metabolic acidosis leads to decreased renal excretion of citrate which is a potent inhibitor of stone disease. Studies examining the effects of Topiramate have demonstrated significant decreases in urinary citrate after initiating therapy. Both calcium oxalate and calcium phosphate stones are reported although calcium phosphate stones may predominate due to an alkaline urine pH associated with the inability to acidify the urine with ongoing carbonic anhydrase therapy.     Patients are seen frequently in practice for development of nephrolithiasis as a complication of carbonic anhydrase inhibitor therapy. It is recommended that patients discontinue therapy if possible to decrease the potential adverse effects of the agent. In addition evaluation of other inherent metabolic risk factors for the development of kidney stone disease is recommended by performing a 24 hour urine for stone risk factors. There are circumstances under which the agents cannot be discontinued. Regardless,  our practice is to assess metabolic risk factors and recommend dietary modification and if indicated potential medical therapy to decrease stone recurrence.       In general we recommend patients drink a good amount of fluid (water preferred) to maximize urine volume to a goal of 2.5 liters per day or more. In addition we recommend following a diet lower in sodium, oxalate and animal protein and higher in fruits and vegetables. It is also prudent to ensure adequate dietary calcium intake to bind dietary oxalate and prevent GI oxalate absorption and resultant increased oxalate excretion. Review of the medical record demonstrates recent CT imaging without current stones and unable to find + family history of stone disease which would increase risk of recurrence.     We would be happy to see the patient in formal consultation at any time.     I spent 20 minutes in medical consultative discussion and review of medical records, including written report to the treating provider via the EMR.

## 2018-03-08 MED FILL — EMGALITY PEN 120 MG/ML SUBCUTANEOUS PEN INJECTOR: 1 days supply | Qty: 2 | Fill #0 | Status: AC

## 2018-03-08 NOTE — Unmapped (Signed)
Received patient's Emgality today. Medication has been placed in fridge. Ready for injection.

## 2018-03-08 NOTE — Unmapped (Signed)
[facility] has couriered [facility] to [facility] ATTN: ANA FELIX via [employer].    Tracking Number: [id number]  Estimated Delivery Time: BEFORE 1:35PM    [name]  [facility]

## 2018-03-09 ENCOUNTER — Ambulatory Visit: Admit: 2018-03-09 | Discharge: 2018-03-10 | Payer: BLUE CROSS/BLUE SHIELD | Attending: Neurology | Primary: Neurology

## 2018-03-09 DIAGNOSIS — Z87442 Personal history of urinary calculi: Secondary | ICD-10-CM

## 2018-03-09 DIAGNOSIS — G43709 Chronic migraine without aura, not intractable, without status migrainosus: Principal | ICD-10-CM

## 2018-03-09 NOTE — Unmapped (Signed)
You received EMGALITY loading dose today- 2 injections.   You should continue to take this medication monthly.   For more information, consider [email or web address]  Track your migraines.     Warmest Regards,    Dr. Hollie Beach  Board Certified: Adult Neurology  [facility] Clinical Epidemiology  [place], [place]      It has been my pleasure to participate in your care.   You may reach me via MyChart, phone or fax.     Please note that while I will try to answer messages in a timely manner, all urgent issues should be directed to your PCP.       Helpful Contact Information :  ?? Appointments/ Internal Medicine Clinic: [phone]    ?? HIPPA Compliant Facsimile: [phone]  ?? Same Day Clinic:  [phone].  8am-5pm Monday through Friday   ?? After Hours:  [phone].  A nurse will answer and can help you decide what kind of medication attention you need.   ?? [facility]:  [phone].  If you are sick, but not injured:  9am-8pm Mon-Sun.  [address], [place], [place] off of [address].  All walk-ins accepted.    ?? [facility]:  [phone].  7am-9pm Mon-Fri; 12pm-5pm [address]  [address], [place] [address].  All walk-ins accepted.    ??    Thank you for choosing [employer].

## 2018-03-09 NOTE — Unmapped (Addendum)
NEUROLOGY REPORT    [facility]  [facility] INTERNAL MEDICINE [place]  [address] [place] [address]  [phone]    Date: March 09, 2018  Patient Name: Amber Carson  MRN: [id number]  PCP:  Amber Carson    Prior visit with this neurologist:  March 05, 2018    Assessment and Plan/ Clinical Decision Making          1. Chronic migraine  2. H/O renal calculi    Longstanding history of migraine, with resurgence post-partum (4 months) and no recent imaging.   Did well with occipital nerve block in the past. And again recently on Sep 30th.   Here for [facility] LD and teaching.   Demonstrated proficiency with autoinjector.   2 doses administered (left and right thigh)  Ongoing Rx monthly- counseled to track headaches and to alternate sides for Rx.     - Ambulatory e-Consult to Nephrology re Topiramate- prefer not to resume- risk for recurrence is present.     - galcanezumab-gnlm (EMGALITY) injection 240 mg- Inject 240 mg under the skin once. for 1 dose LOADING DOSE> to be administered in the clinic.  Dispense: 2 mL; Refill: 0    Patient tolerated procedure well. No bleeding occurred . Pressure was applied to each of the injection sites.  Complications: There were no immediate complications from the procedure. Patient was informed that she should maintain her skin clean in order to avoid local site infections.    HEALTH EDUCATION/HEALTH LITERACY: PATIENT EDUCATION was provided. Reviewed the differential diagnosis, plan of care in detail, as well as other elements as detailed above. The benefits and risks of each of the above procedures, benefits and alternative options were reviewed with the patient.     Medication Management: There is a high risk for medication side effects, which will require ongoing monitoring and dose adjustment. Medication side effect profile was reviewed in detail with patient. Medication safety and drug interactions reviewed on ERx.   Patient Counselling: All the patient's questions and concerns were addressed to their stated satisfaction .    Barriers to Care: None identified.   Total face-to-face time included: 15 minutes > 50 % in direct consultation reviewing details of history, examination and data findings, discussing my clinical reasoning and clinical impression, as well as a review of my recommendations for a plan of treatment as documented above.     Additional time was provided to address alternative options for care, health literacy regarding the differential diagnosis, testing and medication options, the benefits of current plan as well as an opportunity to address all the patient's questions and concerns to their reported satisfaction. A written summary was provided to the patient at the time of the visit, as well as instructions on how to access My Chart. My contact information was provided along with instructions on how to reach the administrative office and the clinic.     Return in about 3 months (around 06/09/2018).         Subjective:          SOURCE: The history is obtained from the patient who appears to be a reliable historian. Additional history is obtained from review of available medical records. Open-ended and close-ended questions, as well as questionnaire data and review of EPIC chart was used to obtain details of the history.    History of Present Illness:     [name] is a 42 y.o. right handed female  seen in consultation at the [facility] Department of General Internal Medicine Outpatient Clinics at the request of Dr. [name], MD for evaluation of Emgality (Loading dose/teaching)    Today's visit: 03/09/18  Doing well- no events since prior visit.   -Here for Emgality injection  Has good understanding for plan of care.   MRI is pending in a few days.       03/05/2018  Migraine headache for years- on and off has been on topiramate with excellent results. Back on topiramate in 2012 for worsening headache.   Has chronic insomnia.   Since baby born, headaches are severe, difficult to control and go on for a week at at time- has had current headache for 7 days.     Location: holocephalic  Severity: Affect ADLS- missing social life. Has to work (in healthcare)  Recent changes in frequency (Last 3 months): YES.   Associated Symptoms: Nausea, vomiting, photophobia, phonophobia    Number of headache free days in last 30 days: unclear- ? 14    Has tried the following  Abortive Therapy:   Analgesics/NSAIDS-  Triptans- Zomig not working    Prophylactic Therapy:  Will get records to review history.   Topiramate- works well- kidney stones X2- most recently off topiramate at start of current pregnancy about a year ago.       Other studies/data:     ?? Sleep Study: yes  ?? Imaging: yes- CT scan many years ago. Some ? Occluded carotid?      Review of Systems: A 10-systems review was performed.Unless otherwise identified here, in HPI or by patient  no additional symptoms identified.     Past Medical History:   Diagnosis Date   ??? Carotid artery occlusion    ??? Depression    ??? Fibromyalgia    ??? Gall bladder disease    ??? GERD (gastroesophageal reflux disease)    ??? Migraine    ??? Peptic ulceration          Past Surgical History:   Procedure Laterality Date   ??? hemorhoidectomy     ??? SINUS SURGERY         Social History     Socioeconomic History   ??? Marital status: Single     Spouse name: Not on file   ??? Number of children: Not on file   ??? Years of education: Not on file   ??? Highest education level: Not on file   Occupational History   ??? Occupation: nurse     Comment: full time   Social Needs   ??? Financial resource strain: Not on file   ??? Food insecurity:     Worry: Not on file     Inability: Not on file   ??? Transportation needs:     Medical: Not on file     Non-medical: Not on file   Tobacco Use   ??? Smoking status: Former Smoker   ??? Smokeless tobacco: Former Estate agent and Sexual Activity   ??? Alcohol use: Yes   ??? Drug use: No ??? Sexual activity: Not on file   Lifestyle   ??? Physical activity:     Days per week: Not on file     Minutes per session: Not on file   ??? Stress: Not on file   Relationships   ??? Social connections:     Talks on phone: Not on file     Gets together:  Not on file     Attends religious service: Not on file     Active member of club or organization: Not on file     Attends meetings of clubs or organizations: Not on file     Relationship status: Not on file   Other Topics Concern   ??? Exercise Yes     Comment: 3x's a wk/yoga   ??? Living Situation Yes     Comment: husband   Social History Narrative   ??? Not on file       Social History     Tobacco Use   Smoking Status Former Smoker   Smokeless Tobacco Former User         reports that she drinks alcohol.      Family History   Problem Relation Age of Onset   ??? Sjogren's syndrome Mother    ??? Diabetes Mother    ??? Osteoporosis Mother    ??? Heart disease Father        No Known Allergies    Current Outpatient Medications   Medication Sig Dispense Refill   ??? cetirizine HCl (ZYRTEC ORAL) Take by mouth.     ??? cyclobenzaprine (FLEXERIL) 5 MG tablet Take 5 mg by mouth Three (3) times a day.     ??? fluticasone (FLONASE) 50 mcg/actuation nasal spray 1 spray by Each Nare route daily.     ??? gabapentin (NEURONTIN) 300 MG capsule TAKE 1 CAPSULE BY MOUTH 3 TIMES DAILY FOR 14 DAYS 42 capsule 0   ??? [START ON 04/04/2018] galcanezumab-gnlm (EMGALITY) 120 mg/mL injection Inject 120 mg under the skin every thirty (30) days. 1 mL 11   ??? galcanezumab-gnlm (EMGALITY) 120 mg/mL injection Inject 240 mg under the skin for 1 dose as a LOADING DOSE to be administered in the clinic. 2 mL 0   ??? hydrocortisone (ANUSOL-HC) 25 mg suppository UNWRAP AND INSERT 1 SUPPOSITORY RECTALLY TWICE DAILY 20 suppository 2   ??? lorazepam (ATIVAN ORAL) Take by mouth.     ??? multivitamin (TAB-A-VITE/THERAGRAN) per tablet Take 1 tablet by mouth daily.     ??? pseudoephedrine HCl (SUDAFED ORAL) Take by mouth.     ??? ranitidine (ZANTAC) 150 MG capsule Take 150 mg by mouth daily as needed for heartburn.     ??? rizatriptan (MAXALT-MLT) 10 MG disintegrating tablet May repeat in 2 hours if needed for migraine. NO more than 2 in 24 hours. 12 tablet 11   ??? traZODone (DESYREL) 50 MG tablet Take 1 tablet (50 mg total) by mouth nightly. 30 tablet 3   ??? venlafaxine (EFFEXOR-XR) 75 MG 24 hr capsule TAKE 1 CAPSULE BY MOUTH ONCE DAILY (Patient not taking: Reported on 01/24/2018) 30 capsule 0   ??? ZOLMitriptan (ZOMIG ZMT) 5 MG disintegrating tablet Take one as needed for headache. Repeat in 2 hours if needed for headache. No more than 2 doses in 24 hours. 10 tablet 11     Current Facility-Administered Medications   Medication Dose Route Frequency Provider Last Rate Last Dose   ??? triamcinolone acetonide (KENALOG-40) injection 80 mg  80 mg Intra-articular Once Cassell Smiles, [place]                Objective:        GENERAL PHYSICAL EXAM:    Vital Signs: Reviewed today.   BP 108/53  - Pulse 63  - Resp 16  - Ht 162.6 cm (5' 4)  - Wt 90.7 kg (199 lb 15.3 oz)  -  SpO2 99%  - BMI 34.32 kg/m??   Estimated body mass index is 34.32 kg/m?? as calculated from the following:    Height as of this encounter: 162.6 cm (5' 4).    Weight as of this encounter: 90.7 kg (199 lb 15.3 oz).  Facility age limit for growth percentiles is 20 years.      Wt Readings from Last 8 Encounters:   03/09/18 90.7 kg (199 lb 15.3 oz)   01/24/18 90.7 kg (200 lb)   01/10/18 90.7 kg (200 lb)   07/13/17 100.4 kg (221 lb 5.5 oz)   03/28/17 96.2 kg (212 lb)   02/03/17 93 kg (205 lb)   01/30/17 95.5 kg (210 lb 9.6 oz)   01/17/17 96.2 kg (212 lb)       General Appearance: The patient was well-groomed and neat, engaged and cooperative.   No evidence of rashes, cyanosis, clubbing, jaundice, edema, petechiae or splinter hemorrhages.  Head:  Atraumatic, normocephalic.    NEUROLOGICAL EXAMINATION   Mental Status  The patient is alert and oriented to time, place and person.   Attention/concentration adequate throughout the examination.   Speech and language were normal at the bedside.  Memory was intact for history, short and long-term.   Thought process: Logical, linear, clear, coherent, goal directed.       Gait: Normal gait and stance.          Diagnostic Studies and Review of Records:     DATA   I have independently reviewed the studies as noted.   1. Medical Records: reviewed including recent eCONSULT from Nephrology.     2. Radiology: Results reviewedCt Abdomen Pelvis W Iv Contrast Only    Result Date: 01/11/2018  EXAM: CT ABDOMEN PELVIS W CONTRAST DATE: 01/10/2018 10:05 PM ACCESSION: [id number] UN DICTATED: 01/10/2018 10:22 PM INTERPRETATION LOCATION: Main [facility]     CLINICAL INDICATION: ABDOMINAL PAIN, (specify site in comments)      COMPARISON: None     TECHNIQUE: A spiral CT scan of the abdomen and pelvis was obtained with IV contrast from the lung bases through the pubic symphysis. Images were reconstructed in the axial plane. Coronal and sagittal reformatted images were also provided for further evaluation.     FINDINGS:     LOWER THORAX: Normal.     HEPATOBILIARY: No focal hepatic lesions. The gallbladder is surgically absent. No biliary dilatation.  SPLEEN: Unremarkable. PANCREAS: Unremarkable.     ADRENALS: Unremarkable. KIDNEYS/URETERS: Symmetric nephrograms. No hydronephrosis.     BLADDER: Decompressed. PELVIC/REPRODUCTIVE ORGANS: The ovaries are unremarkable. Mildly enlarged uterus, likely secondary to recent pregnancy.     GI TRACT: No dilated or thick walled loops of bowel. The appendix is unremarkable.     PERITONEUM/RETROPERITONEUM AND MESENTERY: No free air or fluid. Mild mesenteric fat stranding in the upper abdomen. LYMPH NODES: No enlarged lymph nodes. VESSELS: The aorta is normal in caliber.  No significant calcified atherosclerotic disease. The portal venous system is patent. The hepatic veins and IVC are unremarkable.     BONES AND SOFT TISSUES: Unremarkable.         Nonspecific mesenteric fat stranding in the upper abdomen.     No additional abnormality in the abdomen or pelvis.     ==================== ATTENDING ADDENDUM (01/11/2018 7:30 AM): On review, the following additional findings were noted: Small metallic/radiopaque densities are noted in the anorectal junction. Query prior hemorrhoid surgery.     The above addendum was communicated to the referring physician via [employer]  by Dr. Leandra Kern.    3.Labs: reviewed   Lab Results   Component Value Date    WBC 8.3 01/10/2018    HGB 13.8 01/10/2018    HCT 41.7 01/10/2018    PLT 285 01/10/2018       Lab Results   Component Value Date    NA 139 01/10/2018    K 4.0 01/10/2018    CL 103 01/10/2018    CO2 22.0 01/10/2018    BUN 8 01/10/2018    CREATININE 0.49 (L) 01/10/2018    GLU 154 01/10/2018    CALCIUM 9.2 01/10/2018       Lab Results   Component Value Date    BILITOT 0.5 01/10/2018    PROT 7.5 01/10/2018    ALBUMIN 4.6 01/10/2018    ALT 34 01/10/2018    AST 21 01/10/2018    ALKPHOS 96 01/10/2018         Dr. Hollie Beach    CC: Amber Gell, MD    Dictated using voice-activated software. Typographical errors may exist.       ADDENDUM. 04/13/18  Patient notified of renal consult recommendations- despite risk for stones, she understands the potential risk for recurrence with topiramate but would like to try it because it helps with pain, headaches and weight loss. She will drink more than 3500 ML water daily, no soda and avoid any other compounds (like tea) that may predispose to renal calculi. Low dose escalation started at 25 MG HS PO to increase by 25 MG weekly for target dose of 100 MG Daily. She understands risks and has taken medication previously. Monitor for side effects.

## 2018-03-13 ENCOUNTER — Ambulatory Visit: Admit: 2018-03-13 | Discharge: 2018-03-14 | Payer: BLUE CROSS/BLUE SHIELD

## 2018-03-13 DIAGNOSIS — G43709 Chronic migraine without aura, not intractable, without status migrainosus: Secondary | ICD-10-CM

## 2018-03-13 DIAGNOSIS — M5481 Occipital neuralgia: Secondary | ICD-10-CM

## 2018-03-13 DIAGNOSIS — Z87442 Personal history of urinary calculi: Principal | ICD-10-CM

## 2018-03-19 ENCOUNTER — Ambulatory Visit
Admit: 2018-03-19 | Discharge: 2018-03-20 | Payer: BLUE CROSS/BLUE SHIELD | Attending: Sports Medicine | Primary: Sports Medicine

## 2018-03-19 DIAGNOSIS — M79605 Pain in left leg: Secondary | ICD-10-CM

## 2018-03-19 DIAGNOSIS — M79604 Pain in right leg: Secondary | ICD-10-CM

## 2018-03-19 DIAGNOSIS — R42 Dizziness and giddiness: Principal | ICD-10-CM

## 2018-03-19 MED ORDER — CYCLOBENZAPRINE 5 MG TABLET
ORAL_TABLET | Freq: Three times a day (TID) | ORAL | 1 refills | 0 days | Status: CP
Start: 2018-03-19 — End: 2018-07-24
  Filled 2018-03-26: qty 60, 20d supply, fill #0

## 2018-03-19 MED ORDER — HYDROCORTISONE ACETATE 25 MG RECTAL SUPPOSITORY
Freq: Two times a day (BID) | RECTAL | 2 refills | 0.00000 days | Status: CP | PRN
Start: 2018-03-19 — End: 2019-03-19
  Filled 2018-03-26: qty 20, 10d supply, fill #0

## 2018-03-19 MED ORDER — GABAPENTIN 300 MG CAPSULE
ORAL_CAPSULE | Freq: Three times a day (TID) | ORAL | 1 refills | 0.00000 days | Status: CP
Start: 2018-03-19 — End: 2019-03-19
  Filled 2018-03-26: qty 90, 30d supply, fill #0

## 2018-03-19 NOTE — Unmapped (Addendum)
It was great to see you today    We will check lab work today to further assess your symptoms. I am ordering a cardiac monitor to be placed to see if there are any abnormalities    For your leg pain, please be sure to be wearing shoe inserts and get plenty of breaks at work. Your symptoms are concerning for shin splints, further care instructions attached    Patient Education        Shin Splints: Care Instructions  Your Care Instructions    Shin splints cause pain in the shin, the front part of the lower leg. They can also cause swelling. The pain is most likely from repeated stress on the shinbone (tibia) and the tissue that connects the muscle to the tibia.  Shin splints are common in people who run or jog. Activities where you run or jump on hard surfaces, such as basketball or tennis, can also lead to shin splints. They can also be caused by training too hard or running in shoes that are worn out.  Follow-up care is a key part of your treatment and safety. Be sure to make and go to all appointments, and call your doctor if you are having problems. It's also a good idea to know your test results and keep a list of the medicines you take.  How can you care for yourself at home?  ?? Stop doing the activity that is causing pain, or do less of it, until you feel better.  ? Run or exercise only on soft surfaces, such as dirt or grass.  ? Run on level ground, and avoid hills.  ? Reduce your speed and distance when you run.  ?? If you have pain, prop up the sore leg on a pillow and ice it. Try to keep your leg above the level of your heart. This will help reduce swelling.  ? Put ice or a cold pack on the area for 10 to 20 minutes at a time. Try to do this every 1 to 2 hours (when you are awake) or until the swelling goes down. Put a thin cloth between the ice and your skin.  ?? Take an over-the-counter pain medicine, such as acetaminophen (Tylenol), ibuprofen (Advil, Motrin), or naproxen (Aleve). Be safe with medicines. Read and follow all instructions on the label.  ?? If your doctor gave you stretches or exercises to do, do them exactly as directed.  ?? Get a new pair of shoes. Pick shoes with good arch support and a cushioned sole. Or try shoe inserts (orthotics). Use them in both shoes, even if only one leg hurts.  ?? Don't go back to your old exercise routine too quickly after you feel better. Start slowly. Then, bit by bit, increase how often and how long you work out. If you start out too fast, your pain may come back.  When should you call for help?  Watch closely for changes in your health, and be sure to contact your doctor if:  ?? ?? You have new or worse pain in your shin.   ?? ?? The pain becomes focused in one small area of the shin.   ?? ?? You are not getting better after 2 weeks.   Where can you learn more?  Go to [facility] at [email or web address]/.  [facility] under the Resources menu. Enter [phone] in the search box to learn more about Evette Cristal Splints: Care Instructions.  Current as of: November 29, 2017  Content Version: 12.2  ?? 2006-2019 [employer]. Care instructions adapted under license by [facility]. If you have questions about a medical condition or this instruction, always ask your healthcare professional. [employer] disclaims any warranty or liability for your use of this information.

## 2018-03-19 NOTE — Unmapped (Signed)
[facility]  Return Patient Clinic Note    ASSESSMENT/PLAN:    [name] is a 42 y.o. 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 The room got dark and I felt like I was swirling.  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 She has anxiety all of the time. She does have a history of panic attacks but this does not feel like her typical panic attack  - After her episodes, feels like she could ball up and take a nap.     A/P:  Patient has had several episodes over the past month where she will become dizzy, fatigued and have an elevated HR. She is now 5 months postpartum and recently went back to work over the past 2 months. On exam, she has a tachycardic heart rate with regular rhythm and no other physical examination abnormalities. Differential diagnosis includes orthostatic hypotension, postural tachycardia syndrome (POTS), deconditioning, anxiety or panic attacks. EKG obtained in clinic showing sinus tachycardia. Orthostatic vital signs showing some evidence of orthostasis with a drop in SBP from 130 to 110 and elevation in HR from 101 to 125. Suspicion is highest for orthostatic hypotension versus POTS as the cause of her symptoms at this time.   - EKG obtained in clinic - sinus tachycardia to 110 with no other abnormalities  - CBC, BMP and TSH checked - all within normal limits  - Orthostatic vital signs - Supine: BP 129/83, HR 110; Sitting: BP 130/82, HR 101; Standing: BP 110/89, HR 125  - Encouraged increased PO hydration and eating regular meals  - Will obtain Ziopatch to further assess and follow-up with PCP  - Consider tile table testing to further assess for potential POTS           Relevant Orders    ECG 12 lead (Completed)    CBC (Completed)    TSH (Completed)    Basic Metabolic Panel (Completed)    Orthostatic blood pressure    EXTERNAL ECG-48 HR to 21 DAY    Leg pain, bilateral     HPI:  - Symptoms started after she went back to work  - 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 Suspect that symptoms are due to deconditioning after not working while on maternity leave/towards the end of her pregnancy and recently restarting work.   - Advised the use of shoe inserts and sitting occasionally at work  - Tylenol PRN  - Reassess at follow-up             Attending Physician: Dr. Kreg Shropshire, MD    Follow-up: Return in about 2 weeks (around 04/02/2018) for Dizziness, leg pain.    Future Appointments   Date Time Provider Department Center   04/03/2018 11:25 AM [name], MD [facility]      Chief Complaint   Patient presents with   ??? Medication Refill   ??? Leg Pain     x 3 mo, bilateral leg pain   ??? Faint     feeling faint x 1 mo, w/elevated heart     ROS: The balance of the remaining 10 systems reviewed is negative.    HISTORY:    [name] is a 42 y.o. female that presents to clinic today for leg pain and dizziness.    See above for problem based HPI and A/P.     Health Maintenance   Topic Date Due   ??? Foot Exam  06/10/1987   ??? Retinal Eye Exam  06/10/1987   ??? Urine Albumin/Creatinine Ratio  06/10/1987   ??? Pneumococcal Vaccine (1 of 1 - PPSV23) 06/09/1996   ??? Lipid Screening  06/09/2017   ??? Hemoglobin A1c  03/16/2018   ??? Serum Creatinine Monitoring  01/11/2019   ??? Potassium Monitoring  01/11/2019   ??? HPV Cotest with Pap Smear (21-65)  04/01/2021   ??? Pap Smear with Cotest HPV (21-65)  04/01/2021   ??? DTaP/Tdap/Td Vaccines (4 - Td) 10/06/2027   ??? Influenza Vaccine  Completed      Past Medical History:   Diagnosis Date   ??? Carotid artery occlusion    ??? Depression    ??? Fibromyalgia    ??? Gall bladder disease    ??? GERD (gastroesophageal reflux disease)    ??? Migraine    ??? Peptic ulceration      No Known Allergies  Social History     Tobacco Use   ??? Smoking status: Former Smoker   ??? Smokeless tobacco: Former Estate agent Use Topics   ??? Alcohol use: Yes   ??? Drug use: No     Social History     Patient does not qualify to have social determinant information on file (likely too young).   Social History Narrative   ??? Not on file     Family History   Problem Relation Age of Onset   ??? Sjogren's syndrome Mother    ??? Diabetes Mother    ??? Osteoporosis Mother    ??? Heart disease Father      I have reviewed the patients problem list, current medications, allergies, and social history and updated them as needed.    OBJECTIVE:    BP 127/80 (BP Site: L Arm, BP Position: Sitting, BP Cuff  Size: Large)  - Pulse 118  - Temp 36.8 ??C (Oral)  - Wt 96.5 kg (212 lb 12.8 oz)  - SpO2 98%  - BMI 36.53 kg/m??     Supine: BP 129/83, HR 110; Sitting: BP 130/82, HR 101; Standing: BP 110/89, HR 125    Physical Exam Constitutional: She is oriented to person, place, and time. She appears well-developed and well-nourished. No distress.   Conversant and pleasant, child present   HENT:   Head: Normocephalic and atraumatic.   Mouth/Throat: Oropharynx is Amber and moist.   Eyes: Pupils are equal, round, and reactive to light. Conjunctivae and EOM are normal. Right eye exhibits no discharge. Left eye exhibits no discharge. No scleral icterus.   Cardiovascular: Regular rhythm, normal heart sounds and intact distal pulses.   No murmur heard.  Tachycardic rate   Pulmonary/Chest: Effort normal and breath sounds normal. No respiratory distress. She has no wheezes.   Abdominal: Soft. Bowel sounds are normal. She exhibits no distension. There is no tenderness.   Musculoskeletal: Normal range of motion.         General: No tenderness or edema.   Neurological: She is alert and oriented to person, place, and time.   Skin: Skin is warm. No rash noted. She is not diaphoretic.   Vitals reviewed.    Trena Platt, MD  Resident Physician, PGY-3  [facility]  Pager: [phone]    [facility]  [address], [place], [place] [address] ??? Telephone [phone] ??? Fax [phone]  [email or web address]

## 2018-03-20 LAB — CBC
HEMATOCRIT: 46.6 % — ABNORMAL HIGH (ref 36.0–46.0)
HEMOGLOBIN: 15.1 g/dL (ref 12.0–16.0)
MEAN CORPUSCULAR HEMOGLOBIN CONC: 32.4 g/dL (ref 31.0–37.0)
MEAN CORPUSCULAR HEMOGLOBIN: 32 pg (ref 26.0–34.0)
MEAN CORPUSCULAR VOLUME: 98.8 fL (ref 80.0–100.0)
MEAN PLATELET VOLUME: 7.8 fL (ref 7.0–10.0)
RED CELL DISTRIBUTION WIDTH: 13.3 % (ref 12.0–15.0)
WBC ADJUSTED: 7.1 10*9/L (ref 4.5–11.0)

## 2018-03-20 LAB — THYROID STIMULATING HORMONE: Thyrotropin:ACnc:Pt:Ser/Plas:Qn:: 1.582

## 2018-03-20 LAB — BASIC METABOLIC PANEL
ANION GAP: 8 mmol/L (ref 7–15)
BLOOD UREA NITROGEN: 8 mg/dL (ref 7–21)
BUN / CREAT RATIO: 14
CALCIUM: 9.9 mg/dL (ref 8.5–10.2)
CHLORIDE: 105 mmol/L (ref 98–107)
CO2: 24 mmol/L (ref 22.0–30.0)
CREATININE: 0.56 mg/dL — ABNORMAL LOW (ref 0.60–1.00)
GLUCOSE RANDOM: 100 mg/dL — ABNORMAL HIGH (ref 65–99)
POTASSIUM: 4.4 mmol/L (ref 3.5–5.0)
SODIUM: 137 mmol/L (ref 135–145)

## 2018-03-20 LAB — SODIUM: Sodium:SCnc:Pt:Ser/Plas:Qn:: 137

## 2018-03-20 LAB — MEAN CORPUSCULAR HEMOGLOBIN: Lab: 32

## 2018-03-20 NOTE — Unmapped (Signed)
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 Suspect that symptoms are due to deconditioning after not working while on maternity leave/towards the end of her pregnancy and recently restarting work.   - Advised the use of shoe inserts and sitting occasionally at work  - Tylenol PRN  - Reassess at follow-up

## 2018-03-20 NOTE — Unmapped (Signed)
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 The room got dark and I felt like I was swirling.  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 She has anxiety all of the time. She does have a history of panic attacks but this does not feel like her typical panic attack  - After her episodes, feels like she could ball up and take a nap.     A/P:  Patient has had several episodes over the past month where she will become dizzy, fatigued and have an elevated HR. She is now 5 months postpartum and recently went back to work over the past 2 months. On exam, she has a tachycardic heart rate with regular rhythm and no other physical examination abnormalities. Differential diagnosis includes orthostatic hypotension, postural tachycardia syndrome (POTS), deconditioning, anxiety or panic attacks. EKG obtained in clinic showing sinus tachycardia. Orthostatic vital signs showing some evidence of orthostasis with a drop in SBP from 130 to 110 and elevation in HR from 101 to 125. Suspicion is highest for orthostatic hypotension versus POTS as the cause of her symptoms at this time.   - EKG obtained in clinic - sinus tachycardia to 110 with no other abnormalities  - CBC, BMP and TSH checked - all within normal limits  - Orthostatic vital signs - Supine: BP 129/83, HR 110; Sitting: BP 130/82, HR 101; Standing: BP 110/89, HR 125  - Encouraged increased PO hydration and eating regular meals  - Will obtain Ziopatch to further assess and follow-up with PCP  - Consider tile table testing to further assess for potential POTS

## 2018-03-26 MED FILL — CYCLOBENZAPRINE 5 MG TABLET: 20 days supply | Qty: 60 | Fill #0 | Status: AC

## 2018-03-26 MED FILL — GABAPENTIN 300 MG CAPSULE: 30 days supply | Qty: 90 | Fill #0 | Status: AC

## 2018-03-26 MED FILL — HYDROCORTISONE ACETATE 25 MG RECTAL SUPPOSITORY: 10 days supply | Qty: 20 | Fill #0 | Status: AC

## 2018-04-02 NOTE — Unmapped (Signed)
[facility] Refill and Clinical Coordination Note  Medication(s): Emgality 120mg /ml    Amber Carson, DOB: Feb 12, 1976  Phone: [phone] (home) [phone] (work), Alternate phone contact: N/A  Shipping address: [address]  [place] [address]  Phone or address changes today?: No  All above HIPAA information verified.  Insurance changes? No    Completed refill and clinical call assessment today to schedule patient's medication shipment from the [facility] [phone]).      MEDICATION RECONCILIATION    Confirmed the medication and dosage are correct and have not changed: Yes, regimen is correct and unchanged.    Were there any changes to your medication(s) in the past month:  No, there are no changes reported at this time.    ADHERENCE    Is this medicine transplant or covered by Medicare Part B?       Did you miss any doses in the past 4 weeks? No missed doses reported.  Adherence counseling provided? Not needed     SIDE EFFECT MANAGEMENT    Are you tolerating your medication?:  Amber Carson reports tolerating the medication.  Side effect management discussed: None      Therapy is appropriate and should be continued.    Evidence of clinical benefit: See Epic note from 03/09/18      FINANCIAL/SHIPPING    Delivery Scheduled: Yes, Expected medication delivery date: 04/05/18     Medication will be delivered via Same Day Courier to the prescription address in [facility].    Additional medications refilled: No additional medications/refills needed at this time.    The patient will receive a drug information handout for each medication shipped and additional FDA Medication Guides as required.      Amber Carson did not have any additional questions at this time.    Delivery address confirmed in Epic.     We will follow up with patient monthly for standard refill processing and delivery.      Thank you,  Amber Carson   [facility] Specialty Pharmacist

## 2018-04-03 ENCOUNTER — Encounter
Admit: 2018-04-03 | Discharge: 2018-04-04 | Payer: BLUE CROSS/BLUE SHIELD | Attending: Family Medicine | Primary: Family Medicine

## 2018-04-03 DIAGNOSIS — G540 Brachial plexus disorders: Secondary | ICD-10-CM

## 2018-04-03 DIAGNOSIS — F4310 Post-traumatic stress disorder, unspecified: Principal | ICD-10-CM

## 2018-04-03 MED ORDER — VENLAFAXINE ER 75 MG CAPSULE,EXTENDED RELEASE 24 HR
ORAL_CAPSULE | Freq: Every day | ORAL | 6 refills | 0.00000 days | Status: CP
Start: 2018-04-03 — End: 2018-10-01
  Filled 2018-04-12: qty 30, 30d supply, fill #0

## 2018-04-03 MED ORDER — LORAZEPAM 1 MG TABLET
ORAL_TABLET | Freq: Every day | ORAL | 1 refills | 0.00000 days | Status: CP | PRN
Start: 2018-04-03 — End: 2018-09-28
  Filled 2018-04-12: qty 30, 30d supply, fill #0

## 2018-04-03 MED ORDER — HYDROCODONE 5 MG-ACETAMINOPHEN 325 MG TABLET
ORAL_TABLET | Freq: Every day | ORAL | 0 refills | 0.00000 days | Status: CP | PRN
Start: 2018-04-03 — End: 2018-07-24

## 2018-04-03 NOTE — Unmapped (Signed)
Pt declines the pneumonia vaccine. Pt states she is not a diabetic and does not understand why there are BPA's firing for microalbumin and A1c.

## 2018-04-03 NOTE — Unmapped (Signed)
ASSESSMENT/PLAN:    Problem List Items Addressed This Visit        Other    Thoracic outlet syndrome  Reviewed prior records. occ norco use when pain acts up. Referring to sports medicine re: shoulder pain. Likely exacerbated by carrying 34 month old. Reviewed PDMP 30 tablets of norco given 12/17, discussed concerns re: norco and ativan rx including cardiac death. Knows not to use together. rx norco sparing use prn severe pain    Relevant Medications    HYDROcodone-acetaminophen (NORCO) 5-325 mg per tablet      Other Visit Diagnoses     PTSD (post-traumatic stress disorder)    -  Primary  Felt effexor helped. Will rx effexor 75 mg. Reviewed pdmp previously on intermittent ativan for anxiety/stress reviewed concerns as above re: ativan + norco. Knows not to use together or close. rx ativan 1 mg daily prn anxiety #30 rx    Relevant Medications    LORazepam (ATIVAN) 1 MG tablet    venlafaxine (EFFEXOR XR) 75 MG 24 hr capsule        Likely small pustle in nares. Appears to be draining intermittently at home. Supportive measures    Chief Complaint   Patient presents with   ??? Medication Refill       SUBJECTIVE:    [name] is a 42 y.o. female that presents to clinic today regarding the following issues:    PTSD uses ativan last filled 12/17 when worked through [employer]. When first written, given daily. Weaned off. On average would use 3 times a week for anxiety/chest pressure like cant catch her breath. Overworried, cant catch breath. Would take ativan 1 mg prn. Thinks effexor helped some of the pain. 75 mg helped more then 150 mg. Felt nausea, gas with 150 mg dose.     Ho shoulder injury. May use norco for severe pain. If gets tension HA, different then migraine discomfort. Pain right shoulder esp when lifting. Has 35 month old at home. Years in PT. When  Has rx norco may use 1-2 times a week. Pain sensitive around cycle.     Left nare mild discomfort on inside, bump present    I have reviewed the patients problem list, current medications, allergies, and social history and updated them as needed.    [name]  reports that she has quit smoking. She has quit using smokeless tobacco.    OBJECTIVE:    [name]  height is 162.6 cm (5' 4.02) and weight is 97.8 kg (215 lb 9.6 oz). Her oral temperature is 37 ??C (98.6 ??F). Her blood pressure is 136/82 and her pulse is 89.   Gen: Pleasant female in NAD  EENT: PERRLA, EOMI, left nare with pustule in lateral aspect no active drainage no surrounding induration  CV: Normal S1 S2 no m/r/g  Resp: CTAB

## 2018-04-04 MED ORDER — GALCANEZUMAB-GNLM 120 MG/ML SUBCUTANEOUS PEN INJECTOR
SUBCUTANEOUS | 11 refills | 30.00000 days | Status: CP
Start: 2018-04-04 — End: 2019-04-04
  Filled 2018-04-05: qty 1, 30d supply, fill #0

## 2018-04-05 MED FILL — EMPTY CONTAINER: 120 days supply | Qty: 1 | Fill #0

## 2018-04-05 MED FILL — EMGALITY PEN 120 MG/ML SUBCUTANEOUS PEN INJECTOR: 30 days supply | Qty: 1 | Fill #0 | Status: AC

## 2018-04-05 MED FILL — EMPTY CONTAINER: 120 days supply | Qty: 1 | Fill #0 | Status: AC

## 2018-04-06 NOTE — Unmapped (Signed)
Prior Authorization for Meds (Pt Advice Request) - Fax :     Medication that requires prior authorization: HYDROCODONE-ACETAMINOPHEN 5-325 MG TABLET  Name of insurance company requiring prior authorization: [employer]   Member ID # for this company: [id number]  Insurance company phone number: [phone]  Pharmacy: [facility] - [address] [place], [place]   Phone#: [phone]  Fax#: [phone]  Other comments: PRIOR AUTHORIZATION REQUESTED; UP TO 7 D/S COVERED, REDUCE D/S OR PA REQUIRED.

## 2018-04-09 NOTE — Unmapped (Signed)
I was immediately available and present on site.  I reviewed the case, but did not see the patient.?? I agree with the assessment and plan as documented in the resident's note. Guy Begin, MD

## 2018-04-09 NOTE — Unmapped (Signed)
PA sent awaiting response .Key: [id number]

## 2018-04-10 NOTE — Unmapped (Signed)
PA approved.

## 2018-04-11 ENCOUNTER — Encounter
Admit: 2018-04-11 | Discharge: 2018-04-12 | Payer: BLUE CROSS/BLUE SHIELD | Attending: Family Medicine | Primary: Family Medicine

## 2018-04-11 ENCOUNTER — Encounter: Admit: 2018-04-11 | Discharge: 2018-04-12 | Payer: BLUE CROSS/BLUE SHIELD

## 2018-04-11 DIAGNOSIS — M7551 Bursitis of right shoulder: Secondary | ICD-10-CM

## 2018-04-11 DIAGNOSIS — M25512 Pain in left shoulder: Secondary | ICD-10-CM

## 2018-04-11 DIAGNOSIS — M25511 Pain in right shoulder: Principal | ICD-10-CM

## 2018-04-11 DIAGNOSIS — G8929 Other chronic pain: Secondary | ICD-10-CM

## 2018-04-11 NOTE — Unmapped (Signed)
[facility]   New Patient Note    Patient Name:Amber Carson  MRN: [id number]  DOB: 03/02/1976  Age: 42 y.o.   Date: 04/13/2018    ASSESSMENT AND PLAN     Pt is a 42 y.o. year old female with:     Diagnosis ICD-10-CM Associated Orders   1. Bilateral shoulder pain, unspecified chronicity M25.511 XR Shoulder 3 Or More Views Right    M25.512    2. Chronic right shoulder pain M25.511     G89.29    3. Scapulothoracic bursitis of right shoulder M75.51        1. Discussed ice and heat  2. Advised OTC Tylenol, NSAIDs (if tolerated)  3. Advised home PT  4. Discussed injection options/advanced imaging/Rx medications/referrals if appropriate  5. Consider formal PT    Multiple possible etiologies including scapulothoracic bursitis, biceps tendinosis, subacromial impingement, thoracic outlet syndrome, and CRPS.    Trial of scapulothoracic bursa injection.  Home exercise program to focus on scapular stabilization.  Get all outside records from [facility] orth[facility]ics regarding imaging and treatment.  Based on results we will consider shoulder MRI plus or minus eventual EMG.  May also consider a different diagnostic/therapeutic injections.    Right scapulothoracic injection procedure note    Verbal consent.  Risks, benefits, and alternatives discussed.  Sterile prep.  Joint: right shoulder  Approach: patient supine, needle placed parallel and deep to inferior medial border of scapula  Needle: 25 G 1.5 inch  Meds:2 mg dexamethasone, 20 mg kenalog, 1 cc 1% lidocaine  Sterile bandage applied.  Tolerated well without complication.  Post injection instructions discussed including possible cortisone flare and application of ice.      - Return in about 4 weeks (around 05/09/2018) for Recheck.    SUBJECTIVE     Chief complaint: Shoulder Pain (fell '01. R shoulder. no fractures. has tried PT, lidocaine patches, tens. was diagnosed with TOS. pain with overhead motion/flexion and internal rotation (when turning door handle)) History of present Illness:  Amber Carson is a 42 y.o. female with  has a past medical history of Carotid artery occlusion, Depression, Fibromyalgia, Gall bladder disease, GERD (gastroesophageal reflux disease), Migraine, and Peptic ulceration. who comes in today in with complaint of Shoulder Pain (fell '01. R shoulder. no fractures. has tried PT, lidocaine patches, tens. was diagnosed with TOS. pain with overhead motion/flexion and internal rotation (when turning door handle))  .    Shoulder Pain  Patient complains of right shoulder pain. The onset of the pain was 18 years ago. Mechanism of injury: direct trauma, fall.  Location is global.  The pain is described as aching, boring, burning, shooting, stabbing, throbbing and tingling. The pain is 8/10 on pain scale. No history of dislocation.  Symptoms are aggravated by all activities. Symptoms are diminished by: {ortho nothing reportedly alleviates the pain. No stiffness, crepitus in scapula noted is reported. Treatment to date has been rest, heat, ice, medication: NSAID, lidoderm patch used but not effective, physical therapy and TENS/cupping, without significant relief.  Previously seen at [facility] ortho, had imaging, did lots of PT and reflexology. Does not have records currently.  States this started with fall at work and had a long-standing Workmen's Compensation case in 2001.    Review of Systems   Relevant PMH/ROS as per HPI, otherwise as per below.   Review of Systems Some tingling in rue, no weakness       OBJECTIVE   BP 134/73 (BP  Site: R Arm, BP Position: Sitting)  - Pulse 104   General/Constitutional: Well-nourished, well developed, no apparent distress.   Eyes: EOMI  ENT: MMM  Neck: supple  Respiratory: Normal respirations, no labored breathing   Neuro: Alert and oriented  Psych: Normal mood and affect    Comprehensive Right Shoulder Exam:  Inspection:   Ecchymosis: No   Scapular winging: Yes  TTP:    Scapula with crepitus, biceps tendon, lateral acromial, trapezius muscle  ROM:    F flexion: Normal   Abduction: Normal   ER: Normal   IR: Normal  Strength:   SSP: 5/5   ER: 5/5  IR: 5/5  Special Tests:   Hawkin's: Positive   Neer's: Positive  Empty can: Negative  Subscap lift off: Negative  Drop arm: Negative  O'brien's: Negative  Anterior provocative labral testing: Negative  Posterior provocative labral testing: Negative  AC crossover: Negative  Resisted AC cross adduction: Negative  Speed's: Negative  Yergasson's: Negative  Apprehension: Negative  Jobe Relocation: Negative  Roos: ? Mildly positive    Diagnostic Imaging   Imaging (if performed) was reviewed with the patient.    B/l shoulder x-rays normal    Corbin Ade, MD, [facility]  Primary Care Sports Medicine  [facility]

## 2018-04-12 MED FILL — HYDROCODONE 5 MG-ACETAMINOPHEN 325 MG TABLET: 30 days supply | Qty: 30 | Fill #0 | Status: AC

## 2018-04-12 MED FILL — VENLAFAXINE ER 75 MG CAPSULE,EXTENDED RELEASE 24 HR: 30 days supply | Qty: 30 | Fill #0 | Status: AC

## 2018-04-12 MED FILL — HYDROCODONE 5 MG-ACETAMINOPHEN 325 MG TABLET: ORAL | 30 days supply | Qty: 30 | Fill #0

## 2018-04-12 MED FILL — LORAZEPAM 1 MG TABLET: 30 days supply | Qty: 30 | Fill #0 | Status: AC

## 2018-04-13 MED ORDER — TOPIRAMATE 25 MG TABLET
ORAL_TABLET | 0 refills | 0 days | Status: CP
Start: 2018-04-13 — End: 2018-11-29

## 2018-04-13 NOTE — Unmapped (Signed)
Patient Advice Request - Ancillary Staff [facility] Name and relation (if other than patient): Self  Question/Concern for provider (Specific): The patient is calling to remind her provider to put in the order for either a CT or MRI as per their conversationon at visit DOS: 04/11/2018  Best Contact Time: 8-5 pm / [phone] (w)  Other info: N/A

## 2018-04-14 NOTE — Unmapped (Signed)
Notify patient that I am awaiting her outside records to review before I order further imaging so I know what has been done in the past in [place].

## 2018-04-19 NOTE — Unmapped (Signed)
04-19-18  -  Medical Records were faxed from [facility] to [facility] for the patient on 04-18-18 @ 7:05 pm. These MR were routed to Clinical [facility] for [place] on 04-19-18. Thank you.

## 2018-04-20 ENCOUNTER — Ambulatory Visit
Admit: 2018-04-20 | Discharge: 2018-04-21 | Payer: BLUE CROSS/BLUE SHIELD | Attending: Family Medicine | Primary: Family Medicine

## 2018-04-20 DIAGNOSIS — G8929 Other chronic pain: Secondary | ICD-10-CM

## 2018-04-20 DIAGNOSIS — M25511 Pain in right shoulder: Principal | ICD-10-CM

## 2018-04-20 NOTE — Unmapped (Deleted)
[facility] Sports Medicine   New Patient Note    Patient Name:Amber Carson  MRN: [id number]  DOB: Oct 22, 1975  Age: 42 y.o.   Date: 04/20/2018    ASSESSMENT AND PLAN     Pt is a 42 y.o. year old female with:    {No diagnosis found. (Refresh or delete this SmartLink)}    1. Discussed ice and heat  2. Advised OTC Tylenol, NSAIDs (if tolerated)  3. Advised home PT  4. Discussed injection options/advanced imaging/Rx medications/referrals if appropriate  5. Consider formal PT    Multiple possible etiologies including scapulothoracic bursitis, biceps tendinosis, subacromial impingement, thoracic outlet syndrome, and CRPS.    Trial of scapulothoracic bursa injection.  Home exercise program to focus on scapular stabilization.  Get all outside records from [facility] orthopedics regarding imaging and treatment.  Based on results we will consider shoulder MRI plus or minus eventual EMG.  May also consider a different diagnostic/therapeutic injections.      - No follow-ups on file.    SUBJECTIVE     Chief complaint: No chief complaint on file.      History of present Illness:  Amber Carson is a 42 y.o. female with  has a past medical history of Carotid artery occlusion, Depression, Fibromyalgia, Gall bladder disease, GERD (gastroesophageal reflux disease), Migraine, and Peptic ulceration. who comes in today in with complaint of No chief complaint on file.  .    Shoulder Pain  Patient complains of right shoulder pain. The onset of the pain was 18 years ago. Mechanism of injury: direct trauma, fall.  Location is global.  The pain is described as aching, boring, burning, shooting, stabbing, throbbing and tingling. The pain is 8/10 on pain scale. No history of dislocation.  Symptoms are aggravated by all activities. Symptoms are diminished by: {ortho nothing reportedly alleviates the pain. No stiffness, crepitus in scapula noted is reported. Treatment to date has been rest, heat, ice, medication: NSAID, lidoderm patch used but not effective, physical therapy and TENS/cupping, without significant relief.  Previously seen at [facility] ortho, had imaging, did lots of PT and reflexology. Does not have records currently.  States this started with fall at work and had a long-standing Workmen's Compensation case in 2001.    Interval hx 04/20/18: Follow-up right shoulder pain last visit we tried scapulothoracic injection, this provided *** relief.  I also got her outside records from [facility] orthopedics which show a normal MRI cervical spine and normal upper extremity EMG, she also had trials of facet injections and epidural injections with little to no relief.  In addition there are notes regarding knee pain which she had a seminormal MRI with mild MCL and medial meniscal pathology.    Review of Systems   Relevant PMH/ROS as per HPI, otherwise as per below.   Review of Systems Some tingling in rue, no weakness       OBJECTIVE   There were no vitals taken for this visit.  General/Constitutional: Well-nourished, well developed, no apparent distress.   Eyes: EOMI  ENT: MMM  Neck: supple  Respiratory: Normal respirations, no labored breathing   Neuro: Alert and oriented  Psych: Normal mood and affect    Comprehensive Right Shoulder Exam:  Inspection:   Ecchymosis: No   Scapular winging: Yes  TTP:    Scapula with crepitus, biceps tendon, lateral acromial, trapezius muscle  ROM:    F flexion: Normal   Abduction: Normal   ER: Normal   IR: Normal  Strength:   SSP: 5/5   ER: 5/5  IR: 5/5  Special Tests:   Hawkin's: Positive   Neer's: Positive  Empty can: Negative  Subscap lift off: Negative  Drop arm: Negative  O'brien's: Negative  Anterior provocative labral testing: Negative  Posterior provocative labral testing: Negative  AC crossover: Negative  Resisted AC cross adduction: Negative  Speed's: Negative  Yergasson's: Negative  Apprehension: Negative  Jobe Relocation: Negative  Roos: ? Mildly positive    Diagnostic Imaging   Imaging (if performed) was reviewed with the patient.    B/l shoulder x-rays normal    Corbin Ade, MD, [facility]  Primary Care Sports Medicine  [facility]

## 2018-04-21 NOTE — Unmapped (Signed)
LWBS 

## 2018-04-21 NOTE — Unmapped (Signed)
[facility] Sports Medicine   Established Patient Note    Patient Name:Amber Carson  MRN: [id number]  DOB: 04-15-1976  Age: 42 y.o.   Date: 04/20/2018    ASSESSMENT AND PLAN     Pt is a 42 y.o. year old female with:     Diagnosis ICD-10-CM Associated Orders   1. Chronic right shoulder pain M25.511     G89.29    2. Thoracic outlet syndrome G54.0    3. Myofascial pain on right side M79.18        1. Discussed ice and heat  2. Advised OTC Tylenol, NSAIDs (if tolerated)  3. Advised home PT  4. Discussed injection options/advanced imaging/Rx medications/referrals if appropriate  5. Consider formal PT    Multiple possible etiologies including scapulothoracic bursitis, biceps tendinosis, subacromial impingement, thoracic outlet syndrome, and CRPS.    S/p scapulothoracic bursa injection with good temporary relief.  Continue home exercise program to focus on scapular stabilization.  Reviewed all outside records from [facility] orthopedics regarding imaging and treatment-> nl cspine MRI and NCS/EMG in 2003 and had facet and epidural injections.  For now with proceed with vascular surgery consult to help determine work up for TOS as her complaints now are most c/w with this.  Also trial of trigger point injections today.    Trigger Point Injection Procedure Note:    Verbal consent.  Risks, benefits, and alternatives discussed.  Sterile prep.  Location (s): right trap and lev scap  # trigger points injected: 3  Needle: 25 G 1.5 inch  Meds: Total of 1.5 cc 1% lidocaine used, with 0.5 cc distributed at each trigger point  Sterile bandage(s) applied.  Tolerated well without complication.        - No follow-ups on file.    SUBJECTIVE     Chief complaint: No chief complaint on file.      History of present Illness:  Amber Carson is a 42 y.o. female with  has a past medical history of Carotid artery occlusion, Depression, Fibromyalgia, Gall bladder disease, GERD (gastroesophageal reflux disease), Migraine, and Peptic ulceration. who comes in today in with complaint of No chief complaint on file.  .    Shoulder Pain  Patient complains of right shoulder pain. The onset of the pain was 18 years ago. Mechanism of injury: direct trauma, fall.  Location is global.  The pain is described as aching, boring, burning, shooting, stabbing, throbbing and tingling. The pain is 8/10 on pain scale. No history of dislocation.  Symptoms are aggravated by all activities. Symptoms are diminished by: {ortho nothing reportedly alleviates the pain. No stiffness, crepitus in scapula noted is reported. Treatment to date has been rest, heat, ice, medication: NSAID, lidoderm patch used but not effective, physical therapy and TENS/cupping, without significant relief.  Previously seen at [facility] ortho, had imaging, did lots of PT and reflexology. Does not have records currently.  States this started with fall at work and had a long-standing Workmen's Compensation case in 2001.    Interval hx 04/25/18: Follow-up right shoulder pain last visit we tried scapulothoracic injection, this provided good relief but only for 2 days.  I also got her outside records from [facility] orthopedics which show a normal MRI cervical spine and normal upper extremity EMG, she also had trials of facet injections and epidural injections with little to no relief.  In addition there are notes regarding knee pain which she had a seminormal MRI with mild MCL and medial meniscal  pathology.  Today she states her worst pain in her right upper extremity is the proximal anterior aspect of her chest and somewhat posterior over trapezius muscles.  She is also complaining of some heaviness in the right arm and warmth/tingling into her third fourth and fifth fingers.    Review of Systems   Relevant PMH/ROS as per HPI, otherwise as per below.   Review of Systems Some tingling in rue, no weakness       OBJECTIVE   There were no vitals taken for this visit.  General/Constitutional: Well-nourished, well developed, no apparent distress.   Eyes: EOMI  ENT: MMM  Neck: supple  Respiratory: Normal respirations, no labored breathing   Neuro: Alert and oriented  Psych: Normal mood and affect    Comprehensive Right Shoulder Exam:  Inspection:   Ecchymosis: No   Scapular winging: Yes  TTP:    Scapula with crepitus, biceps tendon, lateral acromial, trapezius muscle, and 1st rib  ROM:    F flexion: Normal   Abduction: Normal   ER: Normal   IR: Normal  Strength:   SSP: 5/5   ER: 5/5  IR: 5/5  Special Tests:   Hawkin's: Positive   Neer's: Positive  Empty can: Negative  Subscap lift off: Negative  Drop arm: Negative  O'brien's: Negative  Anterior provocative labral testing: Negative  Posterior provocative labral testing: Negative  AC crossover: Negative  Resisted AC cross adduction: Negative  Speed's: Negative  Yergasson's: Negative  Apprehension: Negative  Jobe Relocation: Negative  Roos:  Mildly positive    Diagnostic Imaging   Imaging (if performed) was reviewed with the patient.    B/l shoulder x-rays normal    Corbin Ade, MD, [facility]  Primary Care Sports Medicine  [facility]

## 2018-04-25 ENCOUNTER — Encounter
Admit: 2018-04-25 | Discharge: 2018-04-26 | Payer: BLUE CROSS/BLUE SHIELD | Attending: Family Medicine | Primary: Family Medicine

## 2018-04-25 DIAGNOSIS — G8929 Other chronic pain: Secondary | ICD-10-CM

## 2018-04-25 DIAGNOSIS — G540 Brachial plexus disorders: Secondary | ICD-10-CM

## 2018-04-25 DIAGNOSIS — M7918 Myalgia, other site: Secondary | ICD-10-CM

## 2018-04-25 DIAGNOSIS — M25511 Pain in right shoulder: Principal | ICD-10-CM

## 2018-04-26 MED ORDER — GALCANEZUMAB-GNLM 120 MG/ML SUBCUTANEOUS PEN INJECTOR
Freq: Once | SUBCUTANEOUS | 0 refills | 0.00000 days | Status: CP
Start: 2018-04-26 — End: 2018-05-01
  Filled 2018-05-09: qty 1, 30d supply, fill #1

## 2018-04-27 MED ORDER — QUETIAPINE 25 MG TABLET
ORAL_TABLET | 0 refills | 0 days | Status: CP
Start: 2018-04-27 — End: 2018-05-22
  Filled 2018-04-27: qty 30, 15d supply, fill #0

## 2018-04-27 MED FILL — QUETIAPINE 25 MG TABLET: 15 days supply | Qty: 30 | Fill #0 | Status: AC

## 2018-04-28 NOTE — Unmapped (Signed)
Right facial pain today- V2 distribution asstd with mild headache. No nausea. Severe paracervical spasm- recent trigger point injections. Not yet started TOPIRAMATE. Not sleeping this week.     Fam hx of Dic DOloroux    Exam: Mild dysesthesia Right V2 territory. Otherwise normal.     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     PLAN:   Seroquel for migraine abortive, when severe- to help sleep.   Drug interaction done.

## 2018-05-02 NOTE — Unmapped (Signed)
[facility] Refill Coordination Note  Medication: Emgality 120mg /ml    Unable to reach patient to schedule shipment for medication being filled at [facility]. Left voicemail on phone.  As this is the 3rd unsuccessful attempt to reach the patient, no additional phone call attempts will be made at this time.      Phone numbers attempted: [phone]  Last scheduled delivery: [id number]      Please call the [facility] at [phone] (option 4) should you have any further questions.      Thanks,  [facility] Specialty Team

## 2018-05-07 NOTE — Unmapped (Signed)
[facility] Refill Coordination Note    Specialty Medication(s) to be Shipped:   General Specialty: Emgality 120mg /ml     Amber Carson, DOB: 1976/04/15  Phone: [phone] (home) [phone] (work)    All above HIPAA information was verified with patient.     Completed refill call assessment today to schedule patient's medication shipment from the [facility] [phone]).       Specialty medication(s) and dose(s) confirmed: Regimen is correct and unchanged.   Changes to medications: Shakeela reports no changes reported at this time.  Changes to insurance: No  Questions for the pharmacist: No    The patient will receive a drug information handout for each medication shipped and additional FDA Medication Guides as required.      DISEASE/MEDICATION-SPECIFIC INFORMATION        For patients on injectable medications: Patient currently has 0 doses left.  Next injection is scheduled for Patient missed her dose already.    ADHERENCE     Medication Adherence    Patient reported X missed doses in the last month:  0  Specialty Medication:  Emgality 120mg /ml  Patient is on additional specialty medications:  No  Patient is on more than two specialty medications:  No  Any gaps in refill history greater than 2 weeks in the last 3 months:  no  Demonstrates understanding of importance of adherence:  yes  Informant:  patient  Reliability of informant:  reliable      Adherence tools used:  calendar          Confirmed plan for next specialty medication refill:  delivery by pharmacy          Refill Coordination    Has the Patients' Contact Information Changed:  No  Is the Shipping Address Different:  No         MEDICARE PART B DOCUMENTATION     Emgality 120mg /ml: Patient has 0 on hand.    SHIPPING     Shipping address confirmed in Epic.     Delivery Scheduled: Yes, Expected medication delivery date: 05/09/2018 via UPS or courier.     Medication will be delivered via Same Day Courier to the prescription address in Epic WAM.    Rahiem Schellinger P Allena Katz   [facility] Specialty Technician

## 2018-05-09 MED FILL — EMGALITY PEN 120 MG/ML SUBCUTANEOUS PEN INJECTOR: 30 days supply | Qty: 1 | Fill #1 | Status: AC

## 2018-05-11 ENCOUNTER — Ambulatory Visit: Admit: 2018-05-11 | Discharge: 2018-05-12 | Payer: BLUE CROSS/BLUE SHIELD | Attending: Neurology | Primary: Neurology

## 2018-05-11 DIAGNOSIS — M5481 Occipital neuralgia: Principal | ICD-10-CM

## 2018-05-11 DIAGNOSIS — G43811 Other migraine, intractable, with status migrainosus: Secondary | ICD-10-CM

## 2018-05-11 MED ORDER — METHYLPREDNISOLONE 4 MG TABLETS IN A DOSE PACK
ORAL_TABLET | 0 refills | 0 days | Status: CP
Start: 2018-05-11 — End: 2018-06-04
  Filled 2018-05-11: qty 21, 6d supply, fill #0

## 2018-05-11 MED ORDER — ZOLPIDEM 5 MG TABLET: 5 mg | tablet | Freq: Every evening | 0 refills | 0 days | Status: AC

## 2018-05-11 MED ORDER — ZOLPIDEM 5 MG TABLET
ORAL_TABLET | Freq: Every evening | ORAL | 0 refills | 0.00000 days | Status: CP | PRN
Start: 2018-05-11 — End: 2018-05-11
  Filled 2018-05-11: qty 10, 10d supply, fill #0

## 2018-05-11 MED FILL — METHYLPREDNISOLONE 4 MG TABLETS IN A DOSE PACK: 6 days supply | Qty: 21 | Fill #0 | Status: AC

## 2018-05-11 MED FILL — ZOLPIDEM 5 MG TABLET: 10 days supply | Qty: 10 | Fill #0 | Status: AC

## 2018-05-11 NOTE — Unmapped (Signed)
NEUROLOGY REPORT    [facility] [place]  [facility] MEDIC[facility] [place]  [address] [place] [address]  [phone]    Date: May 11, 2018  Patient Name: Amber Carson  MRN: [id number]  PCP:  [name]    Prior visit with this neurologist:  March 09, 2018; March 05, 2018    Assessment and Plan/ Clinical Decision Making          1. Bilateral occipital neuralgia  2. Other migraine with status migrainosus, intractable    Workin today for severe migraine- unable to work.   Chronic migraine with status migranosus today. Unrelenting. Not responsive to triptans.   Recent dosing with Emgality.     PLAN:   Occipital nerve block bilat.  If ineffective- dose pak.   Sleep.     - methylPREDNISolone (MEDROL DOSEPACK) 4 mg tablet; follow package directions  Dispense: 1 Package; Refill: 0  - methylPREDNISolone acetate (DEPO-MEDROL) injection 80 mg  - zolpidem (AMBIEN) 5 MG tablet; Take 1 tablet (5 mg total) by mouth nightly as needed for sleep. for up to 7 days  Dispense: 10 tablet; Refill: 0  - bupivacaine (PF) (MARCAINE) 0.5 % (5 mg/mL) injection (PF) 15 mg      Longstanding history of migraine, with resurgence post-partum (4 months) and no recent imaging.   Did well with occipital nerve block in the past. And again recently on Sep 30th.   - Ambulatory e-Consult to Nephrology re Topiramate- prefer not to resume- risk for recurrence is present. Has not started yet.     HEALTH EDUCATION/HEALTH LITERACY: PATIENT EDUCATION was provided. Reviewed the differential diagnosis, plan of care in detail, as well as other elements as detailed above. The benefits and risks of each of the above procedures, benefits and alternative options were reviewed with the patient.     Medication Management: There is a high risk for medication side effects, which will require ongoing monitoring and dose adjustment. Medication side effect profile was reviewed in detail with patient. Medication safety and drug interactions reviewed on ERx.   Patient Counselling: All the patient's questions and concerns were addressed to their stated satisfaction .    Barriers to Care: None identified.   Total face-to-face time included: 15 minutes > 50 % in direct consultation reviewing details of history, examination and data findings, discussing my clinical reasoning and clinical impression, as well as a review of my recommendations for a plan of treatment as documented above.     Additional time was provided to address alternative options for care, health literacy regarding the differential diagnosis, testing and medication options, the benefits of current plan as well as an opportunity to address all the patient's questions and concerns to their reported satisfaction. A written summary was provided to the patient at the time of the visit, as well as instructions on how to access My Chart. My contact information was provided along with instructions on how to reach the administrative office and the clinic.     Return in about 4 weeks (around 06/08/2018).         Subjective:          SOURCE: The history is obtained from the patient who appears to be a reliable historian. Additional history is obtained from review of available medical records. Open-ended and close-ended questions, as well as questionnaire data and review of EPIC chart was used to obtain details of the history.    History of Present Illness:  [name] is a 42 y.o. right handed female seen in consultation at the [place] [facility] Department of General Internal Medicine Outpatient Clinics at the request of Dr. [name], MD for evaluation of Injection(s)    Today's visit: 05/11/18   Severe headache- worked in.   Unrelenting despite triptan/unable to sleep (even with Seroquel)  Dosed with Emgality- some pain at site, but improves quickly.   Has not yet started Topiramate.        Prior visit: October 4, 2019Doing well- no events since prior visit.   -Here for Emgality injection  Has good understanding for plan of care.   MRI is pending in a few days.       03/05/2018  Migraine headache for years- on and off has been on topiramate with excellent results. Back on topiramate in 2012 for worsening headache.   Has chronic insomnia.   Since baby born, headaches are severe, difficult to control and go on for a week at at time- has had current headache for 7 days.     Location: holocephalic  Severity: Affect ADLS- missing social life. Has to work (in healthcare)  Recent changes in frequency (Last 3 months): YES.   Associated Symptoms: Nausea, vomiting, photophobia, phonophobia    Number of headache free days in last 30 days: unclear- ? 14    Has tried the following  Abortive Therapy:   Analgesics/NSAIDS-  Triptans- Zomig not working    Prophylactic Therapy:  Will get records to review history.   Topiramate- works well- kidney stones X2- most recently off topiramate at start of current pregnancy about a year ago.       Other studies/data:     ?? Sleep Study: yes  ?? Imaging: yes- CT scan many years ago. Some ? Occluded carotid?      Review of Systems: A 10-systems review was performed.Unless otherwise identified here, in HPI or by patient  no additional symptoms identified.     Past Medical History:   Diagnosis Date   ??? Carotid artery occlusion    ??? Depression    ??? Fibromyalgia    ??? Gall bladder disease    ??? GERD (gastroesophageal reflux disease)    ??? Migraine    ??? Peptic ulceration          Past Surgical History:   Procedure Laterality Date   ??? hemorhoidectomy     ??? SINUS SURGERY         Social History     Socioeconomic History   ??? Marital status: Single     Spouse name: Not on file   ??? Number of children: Not on file   ??? Years of education: Not on file   ??? Highest education level: Not on file   Occupational History   ??? Occupation: nurse     Comment: full time   Social Needs   ??? Financial resource strain: Not on file   ??? Food insecurity:     Worry: Not on file     Inability: Not on file   ??? Transportation needs:     Medical: Not on file     Non-medical: Not on file   Tobacco Use   ??? Smoking status: Former Smoker   ??? Smokeless tobacco: Former Estate agent and Sexual Activity   ??? Alcohol use: Yes   ??? Drug use: No   ??? Sexual activity: Not on file   Lifestyle   ???  Physical activity:     Days per week: Not on file     Minutes per session: Not on file   ??? Stress: Not on file   Relationships   ??? Social connections:     Talks on phone: Not on file     Gets together: Not on file     Attends religious service: Not on file     Active member of club or organization: Not on file     Attends meetings of clubs or organizations: Not on file     Relationship status: Not on file   Other Topics Concern   ??? Exercise Yes     Comment: 3x's a wk/yoga   ??? Living Situation Yes     Comment: husband   Social History Narrative   ??? Not on file       Social History     Tobacco Use   Smoking Status Former Smoker   Smokeless Tobacco Former User         reports current alcohol use.      Family History   Problem Relation Age of Onset   ??? Sjogren's syndrome Mother    ??? Diabetes Mother    ??? Osteoporosis Mother    ??? Heart disease Father        No Known Allergies    Current Outpatient Medications   Medication Sig Dispense Refill   ??? cetirizine HCl (ZYRTEC ORAL) Take by mouth.     ??? cyclobenzaprine (FLEXERIL) 5 MG tablet Take 1 tablet (5 mg total) by mouth Three (3) times a day. 60 tablet 1   ??? empty container Misc Use as directed 1 each PRN   ??? fluticasone (FLONASE) 50 mcg/actuation nasal spray 1 spray by Each Nare route daily.     ??? gabapentin (NEURONTIN) 300 MG capsule Take 1 capsule (300 mg total) by mouth Three (3) times a day. 90 capsule 1   ??? galcanezumab-gnlm (EMGALITY) 120 mg/mL injection Inject 1 ml (120 mg) under the skin every thirty (30) days. 1 mL 11   ??? HYDROcodone-acetaminophen (NORCO) 5-325 mg per tablet Take 1 tablet by mouth daily as needed for pain. 30 tablet 0   ??? hydrocortisone (ANUSOL-HC) 25 mg suppository Unwrap and Insert 1 suppository (25 mg total) into the rectum two (2) times a day as needed for hemorrhoids. 20 suppository 2   ??? LORazepam (ATIVAN) 1 MG tablet Take 1 tablet (1 mg total) by mouth daily as needed for anxiety. 30 tablet 1   ??? methylPREDNISolone (MEDROL DOSEPACK) 4 mg tablet follow package directions 1 Package 0   ??? multivitamin (TAB-A-VITE/THERAGRAN) per tablet Take 1 tablet by mouth daily.     ??? pseudoephedrine HCl (SUDAFED ORAL) Take by mouth.     ??? QUEtiapine (SEROQUEL) 25 MG tablet Take 1 to 2 tablets (25-50 MG) daily PRN severe migraine for sleep. DO NOT DRIVE or operate machinery/childcare when taking this medication. 30 tablet 0   ??? ranitidine (ZANTAC) 150 MG capsule Take 150 mg by mouth daily as needed for heartburn.     ??? rizatriptan (MAXALT-MLT) 10 MG disintegrating tablet May repeat in 2 hours if needed for migraine. NO more than 2 in 24 hours. 12 tablet 11   ??? topiramate (TOPAMAX) 25 MG tablet 25MG  HS PO X1wk; then 50MG  HS PO X1wk; then 75MG  HS PO X1wk; then 100MG  HS PO 60 tablet 0   ??? venlafaxine (EFFEXOR XR) 75 MG 24 hr capsule Take 1 capsule (75 mg  total) by mouth daily. 30 capsule 6   ??? ZOLMitriptan (ZOMIG ZMT) 5 MG disintegrating tablet Take one as needed for headache. Repeat in 2 hours if needed for headache. No more than 2 doses in 24 hours. 10 tablet 11   ??? zolpidem (AMBIEN) 5 MG tablet Take 1 tablet (5 mg total) by mouth nightly as needed for sleep. for up to 7 days 10 tablet 0     Current Facility-Administered Medications   Medication Dose Route Frequency Provider Last Rate Last Dose   ??? triamcinolone acetonide (KENALOG-40) injection 80 mg  80 mg Intra-articular Once Cassell Smiles, [place]                Objective:        GENERAL PHYSICAL EXAM:    Vital Signs: Reviewed today.   BP 128/61  - Pulse 95   Estimated body mass index is 36.99 kg/m?? as calculated from the following:    Height as of 04/03/18: 162.6 cm (5' 4.02).    Weight as of 04/20/18: 97.8 kg (215 lb 9.6 oz).  No height and weight on file for this encounter.      Wt Readings from Last 8 Encounters:   04/20/18 97.8 kg (215 lb 9.6 oz)   04/03/18 97.8 kg (215 lb 9.6 oz)   03/19/18 96.5 kg (212 lb 12.8 oz)   03/09/18 90.7 kg (199 lb 15.3 oz)   01/24/18 90.7 kg (200 lb)   01/10/18 90.7 kg (200 lb)   07/13/17 100.4 kg (221 lb 5.5 oz)   03/28/17 96.2 kg (212 lb)       General Appearance: The patient was well-groomed and neat, engaged and cooperative.   No evidence of rashes, cyanosis, clubbing, jaundice, edema, petechiae or splinter hemorrhages.  Head:  Atraumatic, normocephalic. Very tender bilaterally over occipital retion.     NEUROLOGICAL EXAMINATION   Mental Status  The patient is alert and oriented to time, place and person.   Attention/concentration adequate throughout the examination.   Speech and language were normal at the bedside.  Memory was intact for history, short and long-term.   Thought process: Logical, linear, clear, coherent, goal directed.       Gait: Normal gait and stance.          Diagnostic Studies and Review of Records:     DATA   I have independently reviewed the studies as noted.   1. Medical Records: reviewed including recent eCONSULT from Nephrology.     Radiology: Results reviewedMri Head Wo Mra Head Wo Contrast    Result Date: 03/14/2018  EXAM: Magnetic resonance imaging, brain without contrast material, AND Magnetic resonance angiography, head. DATE: 03/13/2018 5:43 PM ACCESSION: [id number] UN DICTATED: 03/14/2018 6:53 AM INTERPRETATION LOCATION: [facility]     CLINICAL INDICATION: 42 years old Female with headache  - Z87.442 - H/O renal calculi - M54.81 - Bilateral occipital neuralgia - G43.709 - Chronic migraine      COMPARISON: None     TECHNIQUE: Multiplanar, multisequence MR imaging of the brain was performed without contrast.  Time of flight MRA of intracranial arterial circulation was also performed.     FINDINGS: Few scattered T2/FLAIR hyperintense foci in the subcortical and periventricular white matter of both cerebral hemispheres. There is no midline shift or mass lesion. There is no evidence of intracranial hemorrhage or acute infarct.  There are no extra-axial fluid collections present.  No diffusion weighted signal abnormality is identified. Moderate mucosal thickening of the left maxillary sinus.  Intracranial MRA does not demonstrate any aneurysms or high grade stenoses. The right vertebral artery is hypoplastic.         Few foci in the white matter of both cerebral hemispheres, nonspecific, but commonly seen with small vessel ischemic change. Differential includes migraine headaches, vasculitis, sequelae of infection/inflammation and less likely demyelinating disease given distribution.     Moderate mucosal thickening, left maxillary sinus.     Unremarkable MRA head.    Xr Shoulder 3 Or More Views Bilateral    Result Date: 04/11/2018  EXAM: XR SHOULDER 3 OR MORE VIEWS BILATERAL DATE: 04/11/2018 3:53 PM ACCESSION: [id number] UN DICTATED: 04/11/2018 3:55 PM INTERPRETATION LOCATION: [facility]     CLINICAL INDICATION: 42 years old Female with Bilateral shoulder pain, unspecified chronicity  - M25.511 - Bilateral shoulder pain, unspecified chronicity - M25.512 - Bilateral shoulder pain, unspecified chronicity      COMPARISON: None.     TECHNIQUE: AP, Grashey, outlet and axillary views of both shoulders.     FINDINGS: No acute fracture or dislocation. Glenohumeral joint space and acromioclavicular joints are preserved bilaterally. No abnormal soft tissue mineralization.         Normal radiographs of both shoulders.    3.Labs: reviewed   Lab Results   Component Value Date    WBC 7.1 03/20/2018    HGB 15.1 03/20/2018    HCT 46.6 (H) 03/20/2018    PLT 323 03/20/2018       Lab Results   Component Value Date    NA 137 03/20/2018    K 4.4 03/20/2018    CL 105 03/20/2018    CO2 24.0 03/20/2018    BUN 8 03/20/2018    CREATININE 0.56 (L) 03/20/2018    GLU 100 (H) 03/20/2018    CALCIUM 9.9 03/20/2018       Lab Results   Component Value Date    BILITOT 0.5 01/10/2018    PROT 7.5 01/10/2018    ALBUMIN 4.6 01/10/2018    ALT 34 01/10/2018    AST 21 01/10/2018    ALKPHOS 96 01/10/2018         Dr. Hollie Beach    CC: [name], MD    Dictated using voice-activated software. Typographical errors may exist.       OCCIPITAL NERVE BLOCK    Indication: Occipital Neuralgia   We have discussed repeating the occipital nerve block which the patient would like to pursue.   Prior to initiation of procedure, proper consent was obtained from the patient after discussing risks and benefits of the procedure.   Risks were including but not limited to infection and bleeding at local sites and distal to site of injections.   Patient agreed to the procedure after thorough discussion.    Using palpation and pain response, target areas in the suboccipital region, including the occipital nerve region bilaterally were identified. A timeout was performed immediately prior to the procedure, identifying patient, and location of injections.    Patient was kept in the sitting position, the occipital groove was properly identified on each side and pressure applied to determine whether reproduction of pain symptoms would occur on palpation. In this patient's case, applied pressure reproduced faithfully the symptoms she described as painful.     After proper identification of the tract of the greater occipital nerve, the sites were thoroughly and carefully cleaned with alcohol swabs in usual fashion.    A solution containing 3cc of 0.5% Sensorcaine, and 1.0 cc of  DepoMedrol (80 mg) for a total of 5-cc was prepared in a 5 CC syringe.     1 mL was applied at each of 1 site at the point of most pain, along the tract of the greater occipital nerve.    Procedure was performed bilaterally.    Patient tolerated procedure well. Minimal bleeding occurred which was easily stopped with applying local pressure. Pressure was applied to each of the injection sites with the intent of spreading the solution under the scalp.    Complications: There were no immediate complications from the procedure. Patient was informed that she should avoid washing site of injections for approximately 4 hours, and thereafter should maintain her hair clean in order to avoid local site infections.

## 2018-05-11 NOTE — Unmapped (Signed)
You received an occipital nerve block today.     Please get some sleep- ok to increase seroquel to 50-75 MG HS. If not effective, 2.5-5 MG Ambien    If headache persists tomorrow, medrol dosepak for 5 days.       Warmest Regards,    Dr. Hollie Beach  Board Certified: Adult Neurology  [facility] of General Medicine and Clinical Epidemiology  [place], [place]      It has been my pleasure to participate in your care.   You may reach me via MyChart, phone or fax.     Please note that while I will try to answer messages in a timely manner, all urgent issues should be directed to your PCP.       Helpful Contact Information :  ?? Appointments/ Internal Medicine Clinic: [phone]    ?? HIPPA Compliant Facsimile: [phone]  ?? Same Day Clinic:  [phone].  8am-5pm Monday through Friday   ?? After Hours:  [phone].  A nurse will answer and can help you decide what kind of medication attention you need.   ?? [facility]:  [phone].  If you are sick, but not injured:  9am-8pm Mon-Sun.  [address], [address], [place], [place] off of [address].  All walk-ins accepted.    ?? [facility] at the [facility]:  [phone].  7am-9pm Mon-Fri; 12pm-5pm [address].  [address], [place] [address].  All walk-ins accepted.    ??    Thank you for choosing [facility].

## 2018-05-12 NOTE — Unmapped (Signed)
Addended by: Enedina Finner on: 05/11/2018 04:27 PM     Modules accepted: Orders

## 2018-05-22 MED FILL — ONDANSETRON 8 MG DISINTEGRATING TABLET: ORAL | 6 days supply | Qty: 18 | Fill #1

## 2018-05-22 MED FILL — RIZATRIPTAN 10 MG DISINTEGRATING TABLET: 20 days supply | Qty: 12 | Fill #1 | Status: AC

## 2018-05-22 MED FILL — GABAPENTIN 300 MG CAPSULE: 30 days supply | Qty: 90 | Fill #1 | Status: AC

## 2018-05-22 MED FILL — PROMETHAZINE 25 MG TABLET: 8 days supply | Qty: 30 | Fill #1 | Status: AC

## 2018-05-22 MED FILL — VENLAFAXINE ER 75 MG CAPSULE,EXTENDED RELEASE 24 HR: ORAL | 30 days supply | Qty: 30 | Fill #1

## 2018-05-22 MED FILL — CYCLOBENZAPRINE 5 MG TABLET: ORAL | 20 days supply | Qty: 60 | Fill #1

## 2018-05-22 MED FILL — VENLAFAXINE ER 75 MG CAPSULE,EXTENDED RELEASE 24 HR: 30 days supply | Qty: 30 | Fill #1 | Status: AC

## 2018-05-22 MED FILL — PROMETHAZINE 25 MG TABLET: ORAL | 8 days supply | Qty: 30 | Fill #1

## 2018-05-22 MED FILL — RIZATRIPTAN 10 MG DISINTEGRATING TABLET: 20 days supply | Qty: 12 | Fill #1

## 2018-05-22 MED FILL — GABAPENTIN 300 MG CAPSULE: ORAL | 30 days supply | Qty: 90 | Fill #1

## 2018-05-22 MED FILL — CYCLOBENZAPRINE 5 MG TABLET: 20 days supply | Qty: 60 | Fill #1 | Status: AC

## 2018-05-22 MED FILL — ONDANSETRON 8 MG DISINTEGRATING TABLET: 6 days supply | Qty: 18 | Fill #1 | Status: AC

## 2018-05-23 MED ORDER — QUETIAPINE 25 MG TABLET
ORAL_TABLET | 0 refills | 0 days | Status: CP
Start: 2018-05-23 — End: 2019-05-23
  Filled 2018-06-05: qty 30, 15d supply, fill #0

## 2018-06-04 MED ORDER — ZOLPIDEM 5 MG TABLET
ORAL_TABLET | Freq: Every evening | ORAL | 0 refills | 0.00000 days | Status: CP | PRN
Start: 2018-06-04 — End: 2019-01-24
  Filled 2018-06-05: qty 30, 60d supply, fill #0

## 2018-06-04 MED ORDER — RIZATRIPTAN 10 MG DISINTEGRATING TABLET
ORAL_TABLET | 11 refills | 0 days | Status: CP
Start: 2018-06-04 — End: 2019-06-05
  Filled 2018-06-05: qty 12, 6d supply, fill #0

## 2018-06-04 NOTE — Unmapped (Addendum)
Medication refill request.     Reminded to track migraines- to assess efficacy of EMGALITY- she thinks maybe Botox would be better.

## 2018-06-04 NOTE — Unmapped (Signed)
[facility] Refill Coordination Note  Specialty Medication(s): Emgality  Additional Medications shipped: none    Amber Carson, DOB: 02-23-1976  Phone: [phone] (home) [phone] (work), Alternate phone contact: N/A  Phone or address changes today?: No  All above HIPAA information was verified with patient.  Shipping Address: [address] [place] [address]   Insurance changes? No    Completed refill call assessment today to schedule patient's medication shipment from the [facility] [phone]).      Confirmed the medication and dosage are correct and have not changed: Yes, regimen is correct and unchanged.    Confirmed patient started or stopped the following medications in the past month:  No, there are no changes reported at this time.    Are you tolerating your medication?:  Amber Carson reports side effects of migraine for 4 days after inital injection..    ADHERENCE    (Below is required for Medicare Part B or Transplant patients only - per drug):   How many tablets were dispensed last month: 1  Patient currently has 0 remaining.    Did you miss any doses in the past 4 weeks? No missed doses, however patient is past due for next injection.    FINANCIAL/SHIPPING    Delivery Scheduled: Yes, Expected medication delivery date: 06/05/18     Medication will be delivered via Clinic Courier [facility] Internal Med clinic to the temporary address in [place].    The patient will receive a drug information handout for each medication shipped and additional FDA Medication Guides as required.      Amber Carson did not have any additional questions at this time.    We will follow up with patient monthly for standard refill processing and delivery.      Thank you,  [name] [facility] Specialty Pharmacist

## 2018-06-05 MED FILL — EMGALITY PEN 120 MG/ML SUBCUTANEOUS PEN INJECTOR: SUBCUTANEOUS | 30 days supply | Qty: 1 | Fill #2

## 2018-06-05 MED FILL — RIZATRIPTAN 10 MG DISINTEGRATING TABLET: 6 days supply | Qty: 12 | Fill #0 | Status: AC

## 2018-06-05 MED FILL — QUETIAPINE 25 MG TABLET: 15 days supply | Qty: 30 | Fill #0 | Status: AC

## 2018-06-05 MED FILL — EMGALITY PEN 120 MG/ML SUBCUTANEOUS PEN INJECTOR: 30 days supply | Qty: 1 | Fill #2 | Status: AC

## 2018-06-05 MED FILL — ZOLPIDEM 5 MG TABLET: 60 days supply | Qty: 30 | Fill #0 | Status: AC

## 2018-06-28 NOTE — Unmapped (Signed)
[facility] Refill Coordination Note  Specialty Medication(s): Emgality 120mg /ml  Additional Medications shipped:      Josephine Wooldridge, DOB: 05-25-1976  Phone: [phone] (home) [phone] (work), Alternate phone contact: N/A  Phone or address changes today?: No  All above HIPAA information was verified with patient.  Shipping Address: [address] [place] [address]   Insurance changes? No    Completed refill call assessment today to schedule patient's medication shipment from the [facility] [phone]).      Confirmed the medication and dosage are correct and have not changed: Yes, regimen is correct and unchanged.    Confirmed patient started or stopped the following medications in the past month:  No, there are no changes reported at this time.    Are you tolerating your medication?:  Aneesha reports tolerating the medication.    ADHERENCE    (Below is required for Medicare Part B or Transplant patients only - per drug):   How many tablets were dispensed last month:    Patient currently has   remaining.    Did you miss any doses in the past 4 weeks? No missed doses reported.    FINANCIAL/SHIPPING    Delivery Scheduled: Yes, Expected medication delivery date: 012920     Medication will be delivered via Same Day Courier to the temporary address in [facility].    The patient will receive a drug information handout for each medication shipped and additional FDA Medication Guides as required.      Indira did not have any additional questions at this time.    We will follow up with patient monthly for standard refill processing and delivery.      Thank you,  [name]   [facility] Specialty Technician

## 2018-07-04 MED FILL — EMGALITY PEN 120 MG/ML SUBCUTANEOUS PEN INJECTOR: 30 days supply | Qty: 1 | Fill #3 | Status: AC

## 2018-07-04 MED FILL — EMGALITY PEN 120 MG/ML SUBCUTANEOUS PEN INJECTOR: SUBCUTANEOUS | 30 days supply | Qty: 1 | Fill #3

## 2018-07-09 MED FILL — VENLAFAXINE ER 75 MG CAPSULE,EXTENDED RELEASE 24 HR: 30 days supply | Qty: 30 | Fill #2 | Status: AC

## 2018-07-09 MED FILL — VENLAFAXINE ER 75 MG CAPSULE,EXTENDED RELEASE 24 HR: ORAL | 30 days supply | Qty: 30 | Fill #2

## 2018-07-19 ENCOUNTER — Encounter: Admit: 2018-07-19 | Discharge: 2018-07-20 | Payer: BLUE CROSS/BLUE SHIELD | Attending: Specialist | Primary: Specialist

## 2018-07-19 DIAGNOSIS — G8929 Other chronic pain: Secondary | ICD-10-CM

## 2018-07-19 DIAGNOSIS — G540 Brachial plexus disorders: Secondary | ICD-10-CM

## 2018-07-19 DIAGNOSIS — M25511 Pain in right shoulder: Principal | ICD-10-CM

## 2018-07-19 NOTE — Unmapped (Addendum)
We are going to schedule you for a MRA and MRI.  The pain clinic will call you to schedule you for a scale block and a pectoralis minor block.  These will be scheduled on separate days.   Once both blocks have been completed - about 1 week follow-up with [place] on Thursday   Once you are scheduled for the blocks call our clinic to let us know and we can get you scheduled

## 2018-07-19 NOTE — Unmapped (Signed)
NEW TOS PATIENT     Patient Name: Amber Carson DOB:1975-12-10  Date of Encounter: 07/19/2018    Assessment:  Patient is a 43 y.o. female with history of left extremity pain, paresthesia, numbness and weakness likely consistent with neurogenic thoracic outlet syndrome. Greater than 45 minutes was spent with the patient describing the etiology, pathogenesis and options of care for her problem, including a thoracic outlet decompression.     Plan:  - MRI of the Brachial Plexus and MRV Chest ordered to rule out any other pathologies that may explain her symptoms.   - A scalene block and pec minor block ordered to further confirm diagnosis and set expectations for the success of outcomes from surgery.    HPI: Verneal Carson is a 43 y.o. female who is referred by Dr. [name], MD for the evaluation of left pain, paresthesia, numbness and weakness.   She reports a fall around 2001-2003 which resulted in pain and paraesthesias in right upper extremity since the accident.  She has headaches several times per week - medication does not provide relief.  She has tried PT many times without relief of symptoms.  Other therapies include cupping, massage and biofeedback.  She has a small child and holding him on a daily basis causes continued pain.  She is no longer breastfeeding.  She requires the use of her hydrocodone 1-2 times per month. She takes the Gabapentin as needed because she is too drowsy when taking daily.     Past Medical History:   Past Medical History:   Diagnosis Date   ??? Anemia 10/2017    post pregnancies   ??? Carotid artery occlusion    ??? Depression    ??? Diabetes mellitus (CMS-HCC) 05/2017    gestational   ??? Fibromyalgia    ??? Gall bladder disease    ??? GERD (gastroesophageal reflux disease)    ??? Kidney stones    ??? Migraine    ??? Peptic ulceration        Past Surgical History:  Past Surgical History:   Procedure Laterality Date   ??? hemorhoidectomy     ??? SINUS SURGERY         Social History: Patient  reports that she has quit smoking. She smoked 0.00 packs per day for 0.00 years. She has quit using smokeless tobacco. She reports previous alcohol use. She reports that she does not use drugs.    ROS:  Balance of systems reviewed x 10 and negative except as noted in HPI.      DASH Score  Date   72 07/19/2018     Physical Examination:   Vitals:    07/19/18 1421   BP: 134/79   Pulse: 112   Temp: 37.2 ??C (99 ??F)     General: Well appearing, no acute distress  Psych: Awake, Alert , Oriented by 3. Affect is drop down box (appropriate, tearful, depressed).   Neck: Supple, no evidence of JVD, no evidence of thyromegaly, no cervical bruits  Cardiac: RRR  Lungs: Easy work of breathing, CTA Bilaterally  Upper extremities: Warm and well perfused. Radial artery pulses bilaterally 2+.  Muscle Strength:  Deltoid: 4/5; Pronator Drift: R- L-; Biceps: 4/5; Wrist Extensor ( Radial Nerve): 5/5; Finger Flexion (C8): R5/5 L5/5; Finger Abduction (T1; Ulnar Nerve): R+ L-; Thumb Opposition (C8-T1; Median Nerve): R5/5 L5/5         Scalene Tenderness: R+ L-  Adson???s Test: R+ LNydia Bouton Test:  R+ L-  ULT: R+ L-

## 2018-07-20 NOTE — Unmapped (Signed)
Asked to consider a right staged scalene and pectoralis minor blocks.  Chart reviewed.  Have ordered.    Please give patient new patient pain intake form upon checking in.    Orders Placed This Encounter   Procedures   ??? Trigger Point Inj(s) 1 or 2 Muscles     Amber [facility]  [facility] [place]  Next available  Right scalene block under ultrasoudn  New patient paperwork     Standing Status:   Future     Standing Expiration Date:   07/21/2019   ??? [place] Needle Guidance Biopsy     Amber Carson  [facility] [place]  Next available  Right scalene block under ultrasoudn  New patient paperwork     Standing Status:   Future     Standing Expiration Date:   07/21/2019   ??? Trigger Point Inj(s) 1 or 2 Muscles     Amber Carson  [facility] [place]  First available after scalene block  Right pec minor block under ultrasoudn     Standing Status:   Future     Standing Expiration Date:   07/21/2019   ??? [place] Needle Guidance Biopsy     Amber Carson  [facility] [place]  First available after scalene block  Right pec minor block under ultrasoudn     Standing Status:   Future     Standing Expiration Date:   07/21/2019

## 2018-07-24 ENCOUNTER — Encounter: Admit: 2018-07-24 | Discharge: 2018-07-25 | Payer: BLUE CROSS/BLUE SHIELD

## 2018-07-24 DIAGNOSIS — G8929 Other chronic pain: Secondary | ICD-10-CM

## 2018-07-24 DIAGNOSIS — G540 Brachial plexus disorders: Secondary | ICD-10-CM

## 2018-07-24 DIAGNOSIS — M25511 Pain in right shoulder: Principal | ICD-10-CM

## 2018-07-24 MED ORDER — CYCLOBENZAPRINE 5 MG TABLET
ORAL_TABLET | Freq: Three times a day (TID) | ORAL | 1 refills | 0 days | Status: CP
Start: 2018-07-24 — End: ?
  Filled 2018-07-25: qty 60, 20d supply, fill #0

## 2018-07-24 MED ORDER — HYDROCODONE 5 MG-ACETAMINOPHEN 325 MG TABLET
ORAL_TABLET | Freq: Every day | ORAL | 0 refills | 0.00000 days | Status: CP | PRN
Start: 2018-07-24 — End: 2018-09-28
  Filled 2018-07-25: qty 30, 30d supply, fill #0

## 2018-07-25 MED FILL — LORAZEPAM 1 MG TABLET: 30 days supply | Qty: 30 | Fill #1 | Status: AC

## 2018-07-25 MED FILL — CYCLOBENZAPRINE 5 MG TABLET: 20 days supply | Qty: 60 | Fill #0 | Status: AC

## 2018-07-25 MED FILL — LORAZEPAM 1 MG TABLET: ORAL | 30 days supply | Qty: 30 | Fill #1

## 2018-07-25 MED FILL — HYDROCODONE 5 MG-ACETAMINOPHEN 325 MG TABLET: 30 days supply | Qty: 30 | Fill #0 | Status: AC

## 2018-08-02 NOTE — Unmapped (Signed)
[facility] Refill Coordination Note  Specialty Medication(s): Emgality 120mg /ml  Additional Medications shipped:      Amber Carson, DOB: 02/18/1976  Phone: [phone] (home) [phone] (work), Alternate phone contact: N/A  Phone or address changes today?: No  All above HIPAA information was verified with patient.  Shipping Address: [address] [place] [address]   Insurance changes? No    Completed refill call assessment today to schedule patient's medication shipment from the [facility] [phone]).      Confirmed the medication and dosage are correct and have not changed: Yes, regimen is correct and unchanged.    Confirmed patient started or stopped the following medications in the past month:  Yes. Amber Carson reports starting the following medications: Topamax    Are you tolerating your medication?:  Amber Carson reports tolerating the medication.    ADHERENCE    (Below is required for Medicare Part B or Transplant patients only - per drug):   How many tablets were dispensed last month: 1ml  Patient currently has 0 remaining.    Did you miss any doses in the past 4 weeks? No missed doses reported.    FINANCIAL/SHIPPING    Delivery Scheduled: Yes, Expected medication delivery date: 030320     Medication will be delivered via Same Day Courier to the temporary address in [facility].    The patient will receive a drug information handout for each medication shipped and additional FDA Medication Guides as required.      Amber Carson did not have any additional questions at this time.    We will follow up with patient monthly for standard refill processing and delivery.      Thank you,  Amber Carson   [facility] Specialty Technician               [facility] Refill Coordination Note  Medication: Emgality 120mg /ml    Unable to reach patient to schedule shipment for medication being filled at [facility]. lft vm for work # the other # did not have vm set up.  As this is the 3rd unsuccessful attempt to reach the patient, no additional phone call attempts will be made at this time.      Phone numbers attempted: [phone]  Last scheduled delivery:07/04/18    Please call the [facility] at [phone] (option 4) should you have any further questions.      Thanks,  Amber Carson

## 2018-08-06 NOTE — Unmapped (Addendum)
Department of Anesthesiology  [facility]  [address]  [place], [place] [address]  [phone]    Date: August 06, 2018  Patient Name: Amber Carson  MRN: [id number]  PCP: [name]  Referring Provider: Omar Person, MD    Assessment:     Amber Carson is a 43 y.o. female. She is [facility] for evaluation of possible thoracic outlet syndrome.  They have been evaluated by Dr. Gayla Doss and referred for consideration of diagnostic anterior scalene block.  Based on the patients description and exam, it does seem that they possibly have thoracic outlet syndrome.  They have undergone physical therapy without improvement.  There are no coagulation concerns or other contraindication to undergoing the block today.  We had a good risk and benefit discussion regarding the procedure and they deny any further questions.  We discussed that this is purely a diagnostic block and they should expect typically a few hours of relief at most, but that it would possibly assist in surgical planning.         Current Pain Provider: no  Urine toxicology: N/A.  Urine toxicology screen N/A appropriate.   Last Opioid Change: N/A  Last EKG: n/a  Previous Compliance Issues: none  Naloxone ordered: no  Total morphine equivalents: no  Benzodiazepine: yes  [place] DOC: no  [place]CCSRS database was reviewed 08/06/18.     1. TOS (thoracic outlet syndrome)          Plan:       -The patient will undergo Right anterior scalene block under ultrasound guidance.  Please see separate note  -They will follow up for another block- right pec minor  -Follow up with Dr. Gayla Doss with results once block/blocks complete  -We will have them follow up with [place] as needed once diagnostic blocks complete.      Subjective:     HPI:  Amber Carson is seen in consultation at the request of Lobonc, Ammie Ferrier, MD  For evaluation and recommendations regarding Her pain. Patient says her pain started in 2002 after an accident.  She states that her pain is worsened with exercise/lifting and is relieved with rest.  She has tried many medications, and PT without significant or lasting relief.      Pain Clinic? No    Current Medications:  Flexeril 5mg  TID PRN  Norco 5-325mg  daily PRN  Topamax (not taking)  Ativan 1mg  daily PRN  Effexor 75mg  daily  Gabapentin 300mg  TID    Her pain started approximately  10 years ago.  Her pain is localized to right shoulder and arm.    Her pain is described as aching, burning, dull, nauseating, sore and tender  Amber Carson is  presenting to the clinic today with pain that is graded as 7/10 in intensity. Her highest level of pain is reported as 10/10 in intensity, least pain level is 6/10 and average pain level is 7/10.  Her pain started Over time.  Her pain symptoms are associated with numbness, tingling, weakness, change in skin sensitivity, stiffness and swelling.  The patient has not experienced lost of bowel or bladder control.  Her pain is made worse with exercise, sitting and lying.  Her pain is present all of the time   Her pain is improved with medication, massage, injections, heat and other.       Previous interventions include PT, TENS unit, Medications,  Epidural Injections, Nerve Blocks, Chiropractic treatments, Acupuncture, Biofeedback and Psychological Counseling    Previous tests include MRI, XRAYs, CT scan and NCS/EMG  Workmans Compensation is not involved.  This is not involved with a lawsuit or lawyer  The treatment goals include Complete resolution and no medication, Complete resolution with medications if necessary and Improvement in pain without complete resolution    Previous Medication Trials: Amitriptyline/Elavil, Athrotec/Diclofenac, Celebrex, Cyclobenzaprine/Flexeril, gabapentin, hydromorphone/dilaudid, ibuprofen, lidocaine ointment, lorazepam, lorcet/oxycodone, lortab/hydrocodone, meloxicam/mobic, metaxalone/skelaxin, methocarbamol/robaxin, Naproxen/Aleve, Oxycodone, Pregabalin/Lyrica, Sertraline/Zoloft, Topiramate/Topamax, Tramadol ER, Tramadol, Trazodone, Ultracet/Tramadol-APAP, Venlafaxine, Vicodin and Zolpidem/Ambien    Past Medical History:   Diagnosis Date   ??? Anemia 10/2017    post pregnancies   ??? Carotid artery occlusion    ??? Depression    ??? Diabetes mellitus (CMS-HCC) 05/2017    gestational   ??? Fibromyalgia    ??? Gall bladder disease    ??? GERD (gastroesophageal reflux disease)    ??? Kidney stones    ??? Migraine    ??? Peptic ulceration      Past Surgical History:   Procedure Laterality Date   ??? hemorhoidectomy     ??? SINUS SURGERY       Family History   Problem Relation Age of Onset   ??? Sjogren's syndrome Mother    ??? Diabetes Mother         no longer has   ??? Osteoporosis Mother    ??? Arthritis Mother    ??? Heart disease Father         quad by-pass 2006   ??? Hyperlipidemia Father    ??? Hypertension Father         to help control heart   ??? Arthritis Maternal Grandmother    ??? Asthma Maternal Primary school teacher   ??? COPD Maternal Primary school teacher   ??? Heart disease Maternal Grandmother         pacemaker very young age   ??? Kidney disease Maternal Grandmother         only one kidney   ??? Clotting disorder Maternal Grandmother         on coumidin   ??? Ulcers Maternal Grandmother         gastric, hyital hernia   ??? Asthma Paternal Primary school teacher   ??? COPD Paternal Primary school teacher   ??? Diabetes Maternal Grandfather    ??? Heart disease Maternal Grandfather         unsure/had artery moved from leg and think had a CABG   ??? Early death Paternal Uncle         dropped dead-autopsy results unknown   ??? Early death Daughter         SIDS   ??? Heart disease Paternal Grandfather         heart/vascular issues       Social History:  She reports that she has quit smoking. She smoked 0.00 packs per day for 0.00 years. She has quit using smokeless tobacco. She reports previous alcohol use. She reports that she does not use drugs.    The patient is married  The patient has children: Yes, 3- 55yr, 84yr, 11mo  The patient is living with spouse/partner and children  Highest level of education: associates degree  Current Employment: Full time  Occupation: Engineer, civil (consulting)  Exercise: walking, yoga  Recreational Drugs: No  Treatment for Substance abuse: No  Use anothers prescription medications: No    Allergies as of 08/09/2018   ??? (No Known Allergies)      Current Outpatient Medications   Medication Sig Dispense Refill   ??? CAMILA 0.35 mg tablet      ??? cetirizine HCl (ZYRTEC ORAL) Take by mouth.     ??? cyclobenzaprine (FLEXERIL) 5 MG tablet Take 1 tablet (5 mg total) by mouth Three (3) times a day. 60 tablet 1   ??? empty container Misc Use as directed 1 each PRN   ??? fluticasone (FLONASE) 50 mcg/actuation nasal spray 1 spray by Each Nare route daily.     ??? gabapentin (NEURONTIN) 300 MG capsule Take 1 capsule (300 mg total) by mouth Three (3) times a day. 90 capsule 1   ??? galcanezumab-gnlm (EMGALITY) 120 mg/mL injection Inject 1 ml (120 mg) under the skin every thirty (30) days. 1 mL 11   ??? HYDROcodone-acetaminophen (NORCO) 5-325 mg per tablet Take 1 tablet by mouth daily as needed for pain. 30 tablet 0   ??? hydrocortisone (ANUSOL-HC) 25 mg suppository Unwrap and Insert 1 suppository (25 mg total) into the rectum two (2) times a day as needed for hemorrhoids. 20 suppository 2   ??? LORazepam (ATIVAN) 1 MG tablet Take 1 tablet (1 mg total) by mouth daily as needed for anxiety. 30 tablet 1   ??? magnesium 200 mg Tab Take 1 capsule by mouth.     ??? multivitamin (TAB-A-VITE/THERAGRAN) per tablet Take 1 tablet by mouth daily.     ??? pseudoephedrine HCl (SUDAFED ORAL) Take by mouth.     ??? QUEtiapine (SEROQUEL) 25 MG tablet Take 1 to 2 tablets (25-50 MG) daily as needed for severe migraine for sleep. DO NOT DRIVE or operate machinery/childcare when taking this medication. 30 tablet 0   ??? ranitidine (ZANTAC) 150 MG capsule Take 150 mg by mouth daily as needed for heartburn.     ??? rizatriptan (MAXALT-MLT) 10 MG disintegrating tablet Take 1 tablet by mouth.  May repeat in 2 hours if needed for migraine. NO more than 2 tablets in 24 hours. 12 tablet 11   ??? topiramate (TOPAMAX) 25 MG tablet 25MG  HS PO X1wk; then 50MG  HS PO X1wk; then 75MG  HS PO X1wk; then 100MG  HS PO (Patient not taking: Reported on 07/19/2018) 60 tablet 0   ??? venlafaxine (EFFEXOR XR) 75 MG 24 hr capsule Take 1 capsule (75 mg total) by mouth daily. 30 capsule 6   ??? zolpidem (AMBIEN) 5 MG tablet Take 1/2 tablet (2.5 mg total) by mouth nightly as needed for sleep. 30 tablet 0     Current Facility-Administered Medications   Medication Dose Route Frequency Provider Last Rate Last Dose   ??? triamcinolone acetonide (KENALOG-40) injection 80 mg  80 mg Intra-articular Once Cassell Smiles, [place]           Imaging/Tests:   MRA chest  IMPRESSION:  Normal MRA of the chest.  No evidence of arterial or venous thoracic outlet syndrome    MRI brachial plexus right   IMPRESSION:  Unremarkable MRI of the brachial plexus.    Xray shoulders  IMPRESSION:  Normal radiographs of both shoulders.    Review of Systems:  GENERAL: fatigue, difficulty sleeping  HEENT: nasal congestion/discharge, ear aches  SKIN:hives, dryness  PULMONARY:none  ENDOCRINE: intolerance to heat, intolerance to cold  GASTROINTESTINAL:gastric reflux  GENITOURINARY:frequent urination  MUSCULOSKELETAL:joint aches/, joint swelling,  back pain, muscle aches and muscle weakness  CARDIOVASCULAR:none  NEUROLOGIC:migraines/headaches  PSYCHIATRIC: low energy and PTSD  She denies any homicidal or suicidal ideation.   HEMATOLOGIC: none  IMMUNOLOGIC: none      Objective:     PHYSICAL EXAM:    Vitals 08/06/18  T- 98.5  HR-94  RR-18  BP-115/63  Sp02- 99  Wt Readings from Last 3 Encounters:   07/19/18 98.4 kg (217 lb)   04/20/18 97.8 kg (215 lb 9.6 oz)   04/03/18 97.8 kg (215 lb 9.6 oz)     GENERAL:  The patient is a well developed, well-nourished obese female and appears to be in no apparent distress. The patient is pleasant and interactive. Patient is a good historian.    HEAD/NECK:    Reveals normocephalic/atraumatic. Mucous membranes are moist.    Range of motion- preserved  CARDIOVASCULAR:   Regular rate, pulses equal in upper extremities  RESPIRATORY:   Normal work of breathing  EXTREMITIES:  no clubbing, cyanosis, or edema was noted.  GASTROINTESTINAL:  Soft, nondistended  NEUROLOGIC:    The patient was alert and oriented times 3 with normal language, attention, cognition and memory. Cranial nerve exam was normal.    Sensation Intact to light touch throughout the bilateral upper and lower extremities.Stacy Gardner maneuver: negative bilateral.  ULTT: positive right.  EAST: positive right  MUSCULOSKELETAL:    Appropriate muscle mass in the upper and lower extremities bilaterally.  Strength 5/5 in upper and lower extremities.  Pain with palpation over the Right coracoid process is present.  REFLEXES:  Reflexes were +2 and symmetric  in all four extremities.   GAIT:  The patient rises from a seated position with no difficulty.  The patient was able to ambulate with no difficulty throughout the clinic today without the assistance of a walking aid-None.   JOINTS:  Good range of motion of all extremities with joints all appearing to be within normal limits.  SKIN:   No obvious rashes lesions or erythema on the exposed skin  PSYCHIATRIC:  Appropriate affect today in clinic          I have personally reviewed the patient's medical record.   I have personally reviewed the patient's clinical labs and/or urine toxicology studies  I have independently reviewed patient's imaging studies.   I have not requested patient's old medical records from the prior provider.   The patient's significant other and/or family member/friend was not present during this visit and has contributed important medical information as pertinent to patient's medical history.   I have reviewed the [place] Narcotic Database today

## 2018-08-06 NOTE — Unmapped (Addendum)
PROCEDURE;  Right  anterior scalene muscle injection under ultrasound guidance.  PRE-PROCEDURE DIAGNOSIS: Thoracic outlet syndrome  POST-PROCEDURE DIAGNOSIS: Same     PERFORMED BY: Dr. Criss Rosales  ASSISTANT: Dr. Zannie Cove  Anesthesia: Local     INTERIM HISTORY:      The patient is a 43 y.o. w/ symptoms c/w TOS here for ant scalene block     DESCRIPTION OF THE PROCEDURE:   The above procedure was decided to be performed and discussed with the patient.  Informed consent was obtained and potential risks discussed including, but not limited to: bleeding, bruising, severe allergic reaction to components of the injection materials, infection, nerve damage, temporary brachial plexus block, and the possibility of no benefit (pain relief) derived from the injection, or in rare occasions worsening of pain. Questions were answered to the patient's satisfaction and the patient wishes to proceed.  Alternative options for treatment have previously been discussed and explored with the patient. The patient denies taking antiplatelet or anticoagulation medications and has a driver today.     An PIV was not placed and the patient was taken to the procedure room and placed supine . The procedure was performed under ultrasound guidance. The patient's heart rate, blood pressure, EKG and oxygen saturation were continuously monitored throughout the procedure. A timeout was performed and the correct side confirmed with the patient. Sedation was not given.     The area of interest was sterilely prepped with chlorhexidinex2 and sterilely draped. The physician wore a hat, mask and sterile gloves.      A 27 gauge 1.5 inch needle was inserted into the Right anterior scalene muscle using ultrasound guidance for visualization. Negative aspiration was confirmed. Contrast was not used. Then, after negative aspiration, 0.13ml of 0.25% bupivacaine was injected at 2 locations within muscle for a total of 1.46ml and the needle was removed intact     The patient tolerated the procedure extremely well. All injection sites were sterilely dressed. The patient was discharged after an appropriate period of observation and post procedural education was given.     Pre-procedure pain score was 7/10  Post-procedure pain score was 5/10     DISPOSITION:   Order placed for R pec minor block  Pt will f/u with Vascular Surgery

## 2018-08-07 MED FILL — EMGALITY PEN 120 MG/ML SUBCUTANEOUS PEN INJECTOR: SUBCUTANEOUS | 30 days supply | Qty: 1 | Fill #4

## 2018-08-07 MED FILL — EMGALITY PEN 120 MG/ML SUBCUTANEOUS PEN INJECTOR: 30 days supply | Qty: 1 | Fill #4 | Status: AC

## 2018-08-08 NOTE — Unmapped (Signed)
Pre-procedure call. Voicemail not set up yet; unable to leave message.

## 2018-08-09 ENCOUNTER — Encounter: Admit: 2018-08-09 | Discharge: 2018-08-10 | Payer: BLUE CROSS/BLUE SHIELD

## 2018-08-09 ENCOUNTER — Encounter
Admit: 2018-08-09 | Discharge: 2018-08-10 | Payer: BLUE CROSS/BLUE SHIELD | Attending: Anesthesiology | Primary: Anesthesiology

## 2018-08-09 DIAGNOSIS — M7918 Myalgia, other site: Principal | ICD-10-CM

## 2018-08-09 DIAGNOSIS — E119 Type 2 diabetes mellitus without complications: Principal | ICD-10-CM

## 2018-08-09 DIAGNOSIS — F329 Major depressive disorder, single episode, unspecified: Principal | ICD-10-CM

## 2018-08-09 DIAGNOSIS — K219 Gastro-esophageal reflux disease without esophagitis: Principal | ICD-10-CM

## 2018-08-09 DIAGNOSIS — Z87891 Personal history of nicotine dependence: Principal | ICD-10-CM

## 2018-08-09 DIAGNOSIS — G540 Brachial plexus disorders: Principal | ICD-10-CM

## 2018-08-09 DIAGNOSIS — G43909 Migraine, unspecified, not intractable, without status migrainosus: Principal | ICD-10-CM

## 2018-08-09 NOTE — Unmapped (Signed)
-  It was good to meet you    -Great information on Thoracic Outlet Sndrome  [email or web address]    -As we discussed, you were sent to [place] for evaluation to undergo a diagnostic block for your possible thoracic outlet syndrome.    -We discussed the risks and benefits of doing the procedure.    -Today we did a anterior scalene block    -The injection you had today will likely only last a few hours.  It is very important to keep track of your chronic arm or neck symptoms and see if they are improved during this time.    -You may have eye droop, facial weakness, neck weakness, or numbness/tingling/weakness in your arm from the injection today.  This will likely only last as long as the local anesthetic works (a few hours).  Be cautious.    -If you have improvement in your baseline pain/symptoms, this means you may respond well to surgery.    -If you do not have improvement, it may mean you do not have thoracic outlet syndrome, or sometimes means we need to try a different block.    -Please follow up with your surgeon with the results of this block.  They will help determine if surgery is an option moving forward.  If they think you would benefit from a repeat block to verify, or to undergo a different block, please contact us and we can set that up.    -We will get you set up for the next block at this point.    -Otherwise will we have you follow up as needed, but please do not hesitate to let us know if we can assist in the future.    MyChart Messages:  Please use MyChart for non-urgent symptoms or matters such as general questions,  non-urgent prescription refills, or non-urgent scheduling issues. For your safety and best  care please DO NOT use MyChart messages to report emergent or urgent symptoms as  messages are only checked during regular business hours.     Thank you for choosing [facility]. It was a pleasure to see you in clinic today. Please contact us with any questions or concerns at [phone].     Criss Rosales, MD    08/09/2018  Anesthesiologist and Pain Management Provider  [facility]

## 2018-08-09 NOTE — Unmapped (Signed)
Procedure complete. Bandage applied by MD.

## 2018-08-14 NOTE — Unmapped (Addendum)
PROCEDURE;  Right pectoralis minor muscle injection under ultrasound guidance.  PRE-PROCEDURE DIAGNOSIS: Pectoralis minor syndrome  POST-PROCEDURE DIAGNOSIS: Same     PERFORMED BY: Dr. Criss Rosales  ASSISTANT: Dr. Nelta Numbers, Dr. Roanna Raider  Anesthesia: Local     INTERIM HISTORY:      The patient is a 43 y.o. w/ symptoms c/w TOS. S/p right ant scalene block. Presents today for right pec minor block     DESCRIPTION OF THE PROCEDURE:   The above procedure was decided to be performed and discussed with the patient.  Informed consent was obtained and potential risks discussed including, but not limited to: bleeding, bruising, severe allergic reaction to components of the injection materials, infection, nerve damage, temporary brachial plexus block, pneumothorax, and the possibility of no benefit (pain relief) derived from the injection, or in rare occasions worsening of pain. Questions were answered to the patient's satisfaction and the patient wishes to proceed.  Alternative options for treatment have previously been discussed and explored with the patient. The patient denies taking antiplatelet or anticoagulation medications and has a driver today.     The patient was taken to the procedure room and placed supine . The procedure was performed under ultrasound guidance. The patient's heart rate, blood pressure, EKG and oxygen saturation were continuously monitored throughout the procedure. A timeout was performed and the correct side confirmed with the patient.     The area of interest was sterilely prepped with chlorhexidinex2 and sterilely draped. The physician wore a hat, mask and sterile gloves.      Ultrasound was used to visualize the pectoralis minor muscle and its attachment to the coracoid process.  Then a skin wheal was made with a 25g 1.5 inch needle and 0.5% lidocaine.  Then a 22 gauge 3.5 inch needle was inserted into the Right pectoralis minor muscle using ultrasound guidance for visualization under in plane visualization. Negative aspiration was confirmed. Contrast was not used. Then 0.26ml of 0.25% bupivacaine was injected at 2 locations within muscle for a total of 1.21ml and the needle was removed intact     The patient tolerated the procedure extremely well. All injection sites were sterilely dressed. The patient was discharged after an appropriate period of observation and post procedural education was given.     Pre-procedure pain score was 6/10  Post-procedure pain score was 4/10     DISPOSITION:   Pt will f/u with Vascular Surgery

## 2018-08-14 NOTE — Unmapped (Signed)
Pre-procedure call. Left message regarding appointment time, arrival time, and to call [phone] if any questions/concerns/need to reschedule.

## 2018-08-15 ENCOUNTER — Encounter
Admit: 2018-08-15 | Discharge: 2018-08-16 | Payer: BLUE CROSS/BLUE SHIELD | Attending: Anesthesiology | Primary: Anesthesiology

## 2018-08-15 ENCOUNTER — Ambulatory Visit: Admit: 2018-08-15 | Discharge: 2018-08-16 | Payer: BLUE CROSS/BLUE SHIELD

## 2018-08-15 ENCOUNTER — Encounter: Admit: 2018-08-15 | Discharge: 2018-08-16 | Payer: BLUE CROSS/BLUE SHIELD

## 2018-08-15 DIAGNOSIS — G540 Brachial plexus disorders: Principal | ICD-10-CM

## 2018-08-15 DIAGNOSIS — M791 Myalgia, unspecified site: Principal | ICD-10-CM

## 2018-08-15 DIAGNOSIS — I7789 Other specified disorders of arteries and arterioles: Principal | ICD-10-CM

## 2018-08-15 DIAGNOSIS — M7918 Myalgia, other site: Principal | ICD-10-CM

## 2018-08-17 MED FILL — VENLAFAXINE ER 75 MG CAPSULE,EXTENDED RELEASE 24 HR: 30 days supply | Qty: 30 | Fill #3 | Status: AC

## 2018-08-17 MED FILL — VENLAFAXINE ER 75 MG CAPSULE,EXTENDED RELEASE 24 HR: ORAL | 30 days supply | Qty: 30 | Fill #3

## 2018-08-24 DIAGNOSIS — K279 Peptic ulcer, site unspecified, unspecified as acute or chronic, without hemorrhage or perforation: Principal | ICD-10-CM

## 2018-08-24 DIAGNOSIS — E119 Type 2 diabetes mellitus without complications: Principal | ICD-10-CM

## 2018-08-24 DIAGNOSIS — I6529 Occlusion and stenosis of unspecified carotid artery: Principal | ICD-10-CM

## 2018-08-24 DIAGNOSIS — N200 Calculus of kidney: Principal | ICD-10-CM

## 2018-08-24 DIAGNOSIS — F329 Major depressive disorder, single episode, unspecified: Principal | ICD-10-CM

## 2018-08-24 DIAGNOSIS — G43909 Migraine, unspecified, not intractable, without status migrainosus: Principal | ICD-10-CM

## 2018-08-24 DIAGNOSIS — K829 Disease of gallbladder, unspecified: Principal | ICD-10-CM

## 2018-08-24 DIAGNOSIS — M797 Fibromyalgia: Principal | ICD-10-CM

## 2018-08-24 DIAGNOSIS — K219 Gastro-esophageal reflux disease without esophagitis: Principal | ICD-10-CM

## 2018-08-24 DIAGNOSIS — D649 Anemia, unspecified: Principal | ICD-10-CM

## 2018-08-31 NOTE — Unmapped (Signed)
[facility] Clinical Assessment & Refill Coordination Note    Amber Carson, DOB: August 28, 1975  Phone: [phone] (home) [phone] (work)    All above HIPAA information was verified with patient.     Specialty Medication(s):   Neurology: Emgality     Current Outpatient Medications   Medication Sig Dispense Refill   ??? CAMILA 0.35 mg tablet      ??? cetirizine HCl (ZYRTEC ORAL) Take by mouth.     ??? cyclobenzaprine (FLEXERIL) 5 MG tablet Take 1 tablet (5 mg total) by mouth Three (3) times a day. 60 tablet 1   ??? empty container Misc Use as directed 1 each PRN   ??? fluticasone (FLONASE) 50 mcg/actuation nasal spray 1 spray by Each Nare route daily.     ??? gabapentin (NEURONTIN) 300 MG capsule Take 1 capsule (300 mg total) by mouth Three (3) times a day. 90 capsule 1   ??? galcanezumab-gnlm (EMGALITY) 120 mg/mL injection Inject 1 ml (120 mg) under the skin every thirty (30) days. 1 mL 11   ??? HYDROcodone-acetaminophen (NORCO) 5-325 mg per tablet Take 1 tablet by mouth daily as needed for pain. 30 tablet 0   ??? hydrocortisone (ANUSOL-HC) 25 mg suppository Unwrap and Insert 1 suppository (25 mg total) into the rectum two (2) times a day as needed for hemorrhoids. 20 suppository 2   ??? LORazepam (ATIVAN) 1 MG tablet Take 1 tablet (1 mg total) by mouth daily as needed for anxiety. 30 tablet 1   ??? magnesium 200 mg Tab Take 1 capsule by mouth.     ??? multivitamin (TAB-A-VITE/THERAGRAN) per tablet Take 1 tablet by mouth daily.     ??? pseudoephedrine HCl (SUDAFED ORAL) Take by mouth.     ??? QUEtiapine (SEROQUEL) 25 MG tablet Take 1 to 2 tablets (25-50 MG) daily as needed for severe migraine for sleep. DO NOT DRIVE or operate machinery/childcare when taking this medication. 30 tablet 0   ??? ranitidine (ZANTAC) 150 MG capsule Take 150 mg by mouth daily as needed for heartburn.     ??? rizatriptan (MAXALT-MLT) 10 MG disintegrating tablet Take 1 tablet by mouth.  May repeat in 2 hours if needed for migraine. NO more than 2 tablets in 24 hours. 12 tablet 11   ??? topiramate (TOPAMAX) 25 MG tablet 25MG  HS PO X1wk; then 50MG  HS PO X1wk; then 75MG  HS PO X1wk; then 100MG  HS PO (Patient not taking: Reported on 07/19/2018) 60 tablet 0   ??? venlafaxine (EFFEXOR XR) 75 MG 24 hr capsule Take 1 capsule (75 mg total) by mouth daily. 30 capsule 6   ??? zolpidem (AMBIEN) 5 MG tablet Take 1/2 tablet (2.5 mg total) by mouth nightly as needed for sleep. 30 tablet 0     Current Facility-Administered Medications   Medication Dose Route Frequency Provider Last Rate Last Dose   ??? triamcinolone acetonide (KENALOG-40) injection 80 mg  80 mg Intra-articular Once Cassell Smiles, [place]            Changes to medications: Trista reports no changes reported at this time.    Allergies   Allergen Reactions   ??? Nsaids (Non-Steroidal Anti-Inflammatory Drug) Other (See Comments)       Changes to allergies: No    SPECIALTY MEDICATION ADHERENCE     Emgality 120 mg/ml: 0 days of medicine on hand     Medication Adherence    Adherence tools used:  calendar  Specialty medication(s) dose(s) confirmed: Regimen is correct and unchanged.     Are there any concerns with adherence? No    Adherence counseling provided? Not needed    CLINICAL MANAGEMENT AND INTERVENTION      Clinical Benefit Assessment:    Do you feel the medicine is effective or helping your condition? Yes    Clinical Benefit counseling provided? Progress note from 05/11/18 shows evidence of clinical benefit    Adverse Effects Assessment:    Are you experiencing any side effects? No    Are you experiencing difficulty administering your medicine? No    Quality of Life Assessment:    How many days over the past month did your migraines keep you from your normal activities? For example, brushing your teeth or getting up in the morning. 0    Have you discussed this with your provider? Not needed    Therapy Appropriateness:     Is therapy appropriate? Yes, therapy is appropriate and should be continued    DISEASE/MEDICATION-SPECIFIC INFORMATION      For patients on injectable medications: Patient currently has 0 doses left.  Next injection is scheduled for 09/07/18.    PATIENT SPECIFIC NEEDS     ? Does the patient have any physical, cognitive, or cultural barriers? No    ? Is the patient high risk? No     ? Does the patient require a Care Management Plan? No     ? Does the patient require physician intervention or other additional services (i.e. nutrition, smoking cessation, social work)? No      SHIPPING     Specialty Medication(s) to be Shipped:   Neurology: Emgality    Other medication(s) to be shipped: none     Changes to insurance: No    Delivery Scheduled: Yes, Expected medication delivery date: 09/03/18.     Medication will be delivered via [facility] clinic to the confirmed temporary address in [facility].    The patient will receive a drug information handout for each medication shipped and additional FDA Medication Guides as required.  Verified that patient has previously received a Conservation officer, historic buildings.    [name] [facility] Specialty Pharmacist

## 2018-09-03 MED FILL — EMGALITY PEN 120 MG/ML SUBCUTANEOUS PEN INJECTOR: 30 days supply | Qty: 1 | Fill #5 | Status: AC

## 2018-09-03 MED FILL — EMGALITY PEN 120 MG/ML SUBCUTANEOUS PEN INJECTOR: SUBCUTANEOUS | 30 days supply | Qty: 1 | Fill #5

## 2018-09-06 ENCOUNTER — Encounter: Admit: 2018-09-06 | Discharge: 2018-09-07 | Payer: BLUE CROSS/BLUE SHIELD | Attending: Specialist | Primary: Specialist

## 2018-09-06 NOTE — Unmapped (Signed)
Patient's call was sent to cardiology nurse voicemail. Patient is returning a phone call from a  nurse Britta Mccreedy about scheduling a phone visits. This is not a cardiology patient. Please contact the patient to schedule. Thank you!

## 2018-09-06 NOTE — Unmapped (Addendum)
This is a telephone encounter    Reason for telephone encounter: Follow-up in patient with right neurogenic thoracic outlet syndrome    History of present illness: This is a very pleasant 43 years old female with history of a right neurogenic thoracic outlet syndrome.The patient is s/p MRIs and a staged Right Scalene and Pec Minor Blocks. The blocks were consistent with resolution of her symptoms.  Upon phone interview , she states that her symptoms came back one week after the block. No Fever and Chills are reported today.    Ass/Plan : This is a 43 yo female with history as per HPI. The patient has positive right Scalene and Pec MInor Blocks.  The patient is recommended for a right Thoracic Outlet Decompression and Pec Minor Release.  Given the COVID 19 Pandemic, I explained to the patient that TO Decompressions are at this moment on hold.  F/U in 1 month.    I spent 10 minutes on the phone with patient explaining the results of the MRIs and Blocks.

## 2018-09-07 NOTE — Unmapped (Signed)
PER F/U NOTES FROM DR. PASCARELLA'S PHONE VISIT WITH PT 09/06/2018 - PT IS TO - Return in about 4 weeks (around 10/04/2018). 09/27/2018 VISIT CANCELED AND R/S'D TO 10/04/2018.

## 2018-09-11 ENCOUNTER — Encounter
Admit: 2018-09-11 | Discharge: 2018-09-12 | Payer: BLUE CROSS/BLUE SHIELD | Attending: Internal Medicine | Primary: Internal Medicine

## 2018-09-11 DIAGNOSIS — J45909 Unspecified asthma, uncomplicated: Principal | ICD-10-CM

## 2018-09-11 DIAGNOSIS — T7840XA Allergy, unspecified, initial encounter: Secondary | ICD-10-CM

## 2018-09-11 NOTE — Unmapped (Signed)
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  No wheezing, stridor, respiratory distress. Subsequently improved after taking benadryl and ranitidine, which she had in her personal bag with her.      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  No wheezing, stridor, respiratory distress. Subsequently improved after taking benadryl and ranitidine, which she had in her personal bag with her.    Problem List:  Patient Active Problem List   Diagnosis   ??? Anxiety   ??? Barrett's esophagus   ??? Neck pain   ??? Chronic bilateral low back pain with bilateral sciatica   ??? Chronic nausea   ??? Dermatographia   ??? Fatigue   ??? Fibromyalgia   ??? Headache   ??? Hip pain   ??? Chronic migraine   ??? Numbness and tingling sensation of skin   ??? Thoracic outlet syndrome   ??? Tobacco abuse   ??? Kidney stones   ??? Family history of SIDS (sudden infant death syndrome)   ??? Gestational diabetes   ??? Dizziness   ??? Leg pain, bilateral         Meds and allergies were reviewed in Epic    ROS: 10 point ROS was performed and is otherwise negative other than mentioned in the HPI    Objective  PE:  Vitals:    09/11/18 1036   BP: 123/60   Pulse: 97   SpO2: 100%     General: pleasant, conversive  Eyes: EOMI, sclera clear  CV: no jvd  Resp: CTAB, no wheezes or crackles, normal WOB  MSK: full ROM, no deformity noted  Skin: maculopapular rash on bilateral cheeks, neck, upper chest  Ext: no cyanosis/clubbing/edema  Neuro: alert, follows commands.

## 2018-09-11 NOTE — Unmapped (Signed)
I saw and evaluated the patient, participating in the key portions of the service.?? I reviewed the resident???s note.?? I agree with the resident???s findings and plan. Redina Zeller C Ziere Docken, MD

## 2018-09-13 NOTE — Unmapped (Signed)
Called to discuss.  She is having persistent issues with allergic reaction to the masks she is wearing.  Happens whenever she wears the masks - includes use of N95, surgical masks of multiple colors.  Swelling of the lips and face.  No generalized swelling or rashes.  Has been taking cetirizine, H2 blockers, and singulair with symptom relief but whenever she puts a mask on.  [facility] is exploring hypoallergenic masks but does not have access to this currently.  I am sending a letter to explain this and request redeployment.  Asked her to call if she needs any further assistance.

## 2018-09-13 NOTE — Unmapped (Signed)
Left message to call back.  Awaiting response.

## 2018-09-20 NOTE — Unmapped (Signed)
[id number]

## 2018-09-20 NOTE — Unmapped (Signed)
[facility] Refill Coordination Note    Specialty Medication(s) to be Shipped:   General Specialty: Emgality 120mg /ml    Other medication(s) to be shipped:       Amber Carson, DOB: Dec 08, 1975  Phone: [phone] (work)      All above HIPAA information was verified with patient.     Completed refill call assessment today to schedule patient's medication shipment from the [facility] [phone]).       Specialty medication(s) and dose(s) confirmed: Regimen is correct and unchanged.   Changes to medications: Kaidan reports no changes at this time.  Changes to insurance: No  Questions for the pharmacist: No    Confirmed patient received Welcome Packet with first shipment. The patient will receive a drug information handout for each medication shipped and additional FDA Medication Guides as required.       DISEASE/MEDICATION-SPECIFIC INFORMATION        N/A    SPECIALTY MEDICATION ADHERENCE     Medication Adherence    Patient reported X missed doses in the last month:  0  Specialty Medication:  Emgality 120mg /ml  Adherence tools used:  calendar                Emgality 120 mg/ml: 0 days of medicine on hand       SHIPPING     Shipping address confirmed in Epic.     Delivery Scheduled: Yes, Expected medication delivery date: 042820.     Medication will be delivered via UPS to the home address in Epic WAM.    [name]   [facility] Specialty Technician

## 2018-09-28 MED ORDER — LORAZEPAM 1 MG TABLET
ORAL_TABLET | Freq: Every day | ORAL | 1 refills | 0 days | Status: CP | PRN
Start: 2018-09-28 — End: ?

## 2018-09-28 MED ORDER — HYDROCODONE 5 MG-ACETAMINOPHEN 325 MG TABLET
ORAL_TABLET | Freq: Every day | ORAL | 0 refills | 0 days | Status: CP | PRN
Start: 2018-09-28 — End: 2019-01-24

## 2018-10-01 MED ORDER — VENLAFAXINE ER 75 MG CAPSULE,EXTENDED RELEASE 24 HR
ORAL_CAPSULE | Freq: Every day | ORAL | 3 refills | 90 days | Status: CP
Start: 2018-10-01 — End: 2019-10-01
  Filled 2018-10-04: qty 90, 90d supply, fill #0

## 2018-10-01 MED FILL — EMGALITY PEN 120 MG/ML SUBCUTANEOUS PEN INJECTOR: 30 days supply | Qty: 1 | Fill #6 | Status: AC

## 2018-10-01 MED FILL — EMGALITY PEN 120 MG/ML SUBCUTANEOUS PEN INJECTOR: SUBCUTANEOUS | 30 days supply | Qty: 1 | Fill #6

## 2018-10-01 NOTE — Unmapped (Signed)
Addended by: Adaline Sill D on: 10/01/2018 12:36 PM     Modules accepted: Orders

## 2018-10-01 NOTE — Unmapped (Signed)
Pt requesting venlafaxine 75 mg with 90 day supply be sent to the [facility] pharmacy.     Please advice.

## 2018-10-04 ENCOUNTER — Encounter: Admit: 2018-10-04 | Discharge: 2018-10-05 | Payer: BLUE CROSS/BLUE SHIELD | Attending: Specialist | Primary: Specialist

## 2018-10-04 MED FILL — VENLAFAXINE ER 75 MG CAPSULE,EXTENDED RELEASE 24 HR: 90 days supply | Qty: 90 | Fill #0 | Status: AC

## 2018-10-08 NOTE — Unmapped (Signed)
I tried to call [name] several times but no answer.

## 2018-10-30 NOTE — Unmapped (Signed)
[facility] Refill Coordination Note    Specialty Medication(s) to be Shipped:   General Specialty: emgality 120mg /ml    Other medication(s) to be shipped:       Amber Carson, DOB: 1976-03-15  Phone: [phone] (work)      All above HIPAA information was verified with patient.     Completed refill call assessment today to schedule patient's medication shipment from the [facility] [phone]).       Specialty medication(s) and dose(s) confirmed: Regimen is correct and unchanged.   Changes to medications: [name] reports no changes at this time.  Changes to insurance: No  Questions for the pharmacist: No    Confirmed patient received Welcome Packet with first shipment. The patient will receive a drug information handout for each medication shipped and additional FDA Medication Guides as required.       DISEASE/MEDICATION-SPECIFIC INFORMATION        N/A    SPECIALTY MEDICATION ADHERENCE     Medication Adherence    Patient reported X missed doses in the last month:  0  Specialty Medication:  emgality 120mg /ml  Adherence tools used:  calendar                Emgality 120 mg/ml: 0 days of medicine on hand       SHIPPING     Shipping address confirmed in Epic.     Delivery Scheduled: Yes, Expected medication delivery date: 05/28.     Medication will be delivered via UPS to the home address in Epic W[facility].    [name]   [facility] Specialty Technician

## 2018-10-31 MED FILL — EMGALITY PEN 120 MG/ML SUBCUTANEOUS PEN INJECTOR: 30 days supply | Qty: 1 | Fill #7 | Status: AC

## 2018-10-31 MED FILL — EMGALITY PEN 120 MG/ML SUBCUTANEOUS PEN INJECTOR: SUBCUTANEOUS | 30 days supply | Qty: 1 | Fill #7

## 2018-11-26 NOTE — Unmapped (Signed)
[facility] Refill Coordination Note    Specialty Medication(s) to be Shipped:   General Specialty: Emgality 120mg /ml    Other medication(s) to be shipped:       Amber Carson, DOB: 10-06-1975  Phone: [phone] (work)      All above HIPAA information was verified with patient.     Completed refill call assessment today to schedule patient's medication shipment from the [facility] [phone]).       Specialty medication(s) and dose(s) confirmed: Regimen is correct and unchanged.   Changes to medications: Amber Carson reports no changes at this time.  Changes to insurance: No  Questions for the pharmacist: No    Confirmed patient received Welcome Packet with first shipment. The patient will receive a drug information handout for each medication shipped and additional FDA Medication Guides as required.       DISEASE/MEDICATION-SPECIFIC INFORMATION        N/A    SPECIALTY MEDICATION ADHERENCE     Medication Adherence    Patient reported X missed doses in the last month:  0  Specialty Medication:  emgality refilll   Patient is on additional specialty medications:  No  Adherence tools used:  calendar                Emgality 120 mg/ml: 0 days of medicine on hand         SHIPPING     Shipping address confirmed in Epic.     Delivery Scheduled: Yes, Expected medication delivery date: 06/26.     Medication will be delivered via UPS to the home address in Epic WAM.    Amber Carson   [facility] Specialty Technician

## 2018-11-28 NOTE — Unmapped (Signed)
I have sent mychart message. Pt has renal calculi- Renal e Consult suggests high risk for recurrence.

## 2018-11-29 MED ORDER — TOPIRAMATE 25 MG TABLET
ORAL_TABLET | Freq: Every evening | ORAL | 2 refills | 0 days | Status: CP
Start: 2018-11-29 — End: 2019-02-27

## 2018-11-29 MED FILL — EMGALITY PEN 120 MG/ML SUBCUTANEOUS PEN INJECTOR: SUBCUTANEOUS | 30 days supply | Qty: 1 | Fill #8

## 2018-11-29 MED FILL — EMGALITY PEN 120 MG/ML SUBCUTANEOUS PEN INJECTOR: 30 days supply | Qty: 1 | Fill #8 | Status: AC

## 2018-11-29 NOTE — Unmapped (Signed)
Concern for topiramate use - patient desires for migraine prophylaxis and understands risk for kidney stones and that stones could mean renal disease long term. She is using low dose. Reminded to drink plenty of fluids and discontinue use with any symptoms suggestive of renal calculi.     PLAN:   Refill provided.     See back in 2 months with HA calendar.

## 2018-12-03 ENCOUNTER — Ambulatory Visit
Admit: 2018-12-03 | Discharge: 2018-12-04 | Payer: BLUE CROSS/BLUE SHIELD | Attending: Family Medicine | Primary: Family Medicine

## 2018-12-03 DIAGNOSIS — G8929 Other chronic pain: Secondary | ICD-10-CM

## 2018-12-03 DIAGNOSIS — M25562 Pain in left knee: Principal | ICD-10-CM

## 2018-12-03 NOTE — Unmapped (Signed)
[facility]  Established Patient Clinic Note    Assessment/Plan:   Amber Carson is a 43 y.o.female who presents for the following issues:    Problem List Items Addressed This Visit        Other    Chronic pain of left knee - Primary     Patient with 2 months of progressive pain of left knee. No identifiable trigger, although she does recall one time in which she stepped off a ladder in a weird way 2 months ago. Has had pain mainly along lateral knee, occasionally medial as well. Radiates up to her hip and down the lateral aspect of her leg. Has been worsening. Now has pain even when sitting. Twisting motions of her knee, walking up stairs, squatting make the pain worse. No popping, clicking. Does occasionally have buckling sensation, but knee has not given out. No swelling that she has noticed. Has tried Aleve, which helps, and topical creams and Voltaren. Exam notable for tenderness along IT band and at insertion point of IT band at knee, positive Ober's test, positive McMurray's, and positive Thessaly's test. 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 Will trial PT and conservative therapy first, and patient may need MRI in the future, which she understands.  - PT referral placed  - Aleve BID  - IT band exercises and mensicus injury exercises provided in AVS  - Encouraged to try foam rolling IT band, heat or ice to see if that relieves pain  - Follow up after PT         Relevant Orders    AMB REFERRAL TO PHYSICAL THERAPY          Attending: Dr. Ane Payment    Subjective   Amber Carson is a 43 y.o. female  coming to clinic today for the following issues:    Chief Complaint   Patient presents with   ??? Knee Pain     Left - x 2 months     HPI:    Amber Carson is a 43 year old woman with a history of Barrett's esophagus, chronic low back pain and sciatica, migraines, fibromyalgia, and thoracic outlet syndrome who presents for L knee pain.    # L knee pain  - Going on for 2 months  - Getting progressively worse  - 8/10 in severity    I have reviewed the problem list, medications, and allergies and have updated/reconciled them if needed.    Amber Carson  reports that she has quit smoking. She smoked 0.00 packs per day for 0.00 years. She has quit using smokeless tobacco.  Health Maintenance   Topic Date Due   ??? Foot Exam  06/10/1987   ??? Retinal Eye Exam  06/10/1987   ??? Pneumococcal Vaccine (1 of 1 - PPSV23) 06/09/1996   ??? Lipid Screening  06/09/2017   ??? Hemoglobin A1c  03/16/2018   ??? Urine Albumin/Creatinine Ratio  06/30/2018   ??? Serum Creatinine Monitoring  03/21/2019   ??? Potassium Monitoring  03/21/2019   ??? HPV Cotest with Pap Smear (21-65)  04/11/2021   ??? Pap Smear with Cotest HPV (21-65)  04/11/2021   ??? DTaP/Tdap/Td Vaccines (4 - Td) 10/06/2027   ??? Influenza Vaccine  Completed       Objective     VITALS: BP (P) 137/67 (BP Site: R Arm, BP Position: Sitting, BP Cuff Size: Large)  - Pulse (  P) 94  - Temp (P) 36.9 ??C (Oral)  - Wt (!) (P) 101.3 kg (223 lb 6.4 oz)  - BMI (P) 38.33 kg/m??     Physical Exam  Constitutional:       General: She is not in acute distress.     Appearance: She is well-developed. She is not diaphoretic.   HENT:      Head: Normocephalic and atraumatic.   Eyes:      General: No scleral icterus.     Conjunctiva/sclera: Conjunctivae normal.   Neck:      Musculoskeletal: Normal range of motion.      Trachea: No tracheal deviation.   Cardiovascular:      Rate and Rhythm: Normal rate and regular rhythm.      Heart sounds: Normal heart sounds. No murmur. No friction rub. No gallop.    Pulmonary:      Effort: Pulmonary effort is normal.      Breath sounds: Normal breath sounds. No wheezing or rales.   Musculoskeletal: Normal range of motion.         General: No swelling.      Comments: No erythema, edema, or effusion of L knee. Tender to palpation along insertion point of IT band on lateral knee. Normal ROM, although pain elicited on external and internal rotation of knee. Negative valgus and varus stress tests, anterior and posterior drawer tests. Pain elicited with McMurray's test. Positive Thessaly's test. 5/5 strength.  Also has tenderness to palpation along distal IT band. Positive Ober's test.   Skin:     General: Skin is warm and dry.   Neurological:      Mental Status: She is alert and oriented to person, place, and time.         LABS/IMAGING  I have reviewed pertinent recent labs and imaging in Epic    [facility]  [place] [facility] [place] [facility]  [address] [place], [place] [address] ??? Telephone [phone] ??? Fax [phone]  [email or web address]

## 2018-12-04 NOTE — Unmapped (Signed)
For your knee pain:  - Take Aleve twice a day for pain and inflammation  - Try ice or heat applied to the area that hurts the most to see if it helps relieve the pain  - Try stretches below and/or foam roller for your IT band    Patient Education        Meniscus Tear: Exercises  Introduction  Here are some examples of exercises for you to try. The exercises may be suggested for a condition or for rehabilitation. Start each exercise slowly. Ease off the exercises if you start to have pain.  You will be told when to start these exercises and which ones will work best for you.  How to do the exercises  Calf wall stretch   1. Stand facing a wall with your hands on the wall at about eye level. Put your affected leg about a step behind your other leg.  2. Keeping your back leg straight and your back heel on the floor, bend your front knee and gently bring your hip and chest toward the wall until you feel a stretch in the calf of your back leg.  3. Hold the stretch for at least 15 to 30 seconds.  4. Repeat 2 to 4 times.  5. Repeat steps 1 through 4, but this time keep your back knee bent.    Hamstring wall stretch   1. Lie on your back in a doorway, with your good leg through the open door.  2. Slide your affected leg up the wall to straighten your knee. You should feel a gentle stretch down the back of your leg.  3. Hold the stretch for at least 1 minute. Then over time, try to lengthen the time you hold the stretch to as long as 6 minutes.  4. Repeat 2 to 4 times.  5. If you do not have a place to do this exercise in a doorway, there is another way to do it:  6. Lie on your back, and bend your affected leg.  7. Loop a towel under the ball and toes of that foot, and hold the ends of the towel in your hands.  8. Straighten your knee, and slowly pull back on the towel. You should feel a gentle stretch down the back of your leg.  9. Hold the stretch for at least 15 to 30 seconds. Or even better, hold the stretch for 1 minute if you can.  10. Repeat 2 to 4 times.  1. Do not arch your back.  2. Do not bend either knee.  3. Keep one heel touching the floor and the other heel touching the wall. Do not point your toes.    Quad sets   1. Sit with your leg straight and supported on the floor or a firm bed. (If you feel discomfort in the front or back of your knee, place a small towel roll under your knee.)  2. Tighten the muscles on top of your thigh by pressing the back of your knee flat down to the floor. (If you feel discomfort under your kneecap, place a small towel roll under your knee.)  3. Hold for about 6 seconds, then rest up to 10 seconds.  4. Do 8 to 12 repetitions several times a day.    Straight-leg raises to the front   1. Lie on your back with your good knee bent so that your foot rests flat on the floor. Your injured leg should be straight.  Make sure that your low back has a normal curve. You should be able to slip your flat hand in between the floor and the small of your back, with your palm touching the floor and your back touching the back of your hand.  2. Tighten the thigh muscles in the injured leg by pressing the back of your knee flat down to the floor. Hold your knee straight.  3. Keeping the thigh muscles tight, lift your injured leg up so that your heel is about 12 inches off the floor. Hold for 5 seconds and then lower slowly.  4. Do 8 to 12 repetitions.    Straight-leg raises to the back   1. Lie on your stomach, and lift your leg straight up behind you (toward the ceiling).  2. Lift your toes about 6 inches off the floor, hold for about 6 seconds, then lower slowly.  3. Do 8 to 12 repetitions.    Hamstring curls   1. Lie on your stomach with your knees straight. If your kneecap is uncomfortable, roll up a washcloth and put it under your leg just above your kneecap.  2. Lift the foot of your injured leg by bending the knee so that you bring the foot up toward your buttock. If this motion hurts, try it without bending your knee quite as far. This may help you avoid any painful motion.  3. Slowly lower your leg back to the floor.  4. Do 8 to 12 repetitions.  5. With permission from your doctor or physical therapist, you may also want to add a cuff weight to your ankle (not more than 5 pounds). With weight, you do not have to lift your leg more than 12 inches to get a hamstring workout.    Wall slide with ball squeeze   1. Stand with your back against a wall and with your feet about shoulder-width apart. Your feet should be about 12 inches away from the wall.  2. Put a ball about the size of a soccer ball between your knees. Then slowly slide down the wall until your knees are bent about 20 to 30 degrees.  3. Tighten your thigh muscles by squeezing the ball between your knees. Hold that position for about 10 seconds, then stop squeezing. Rest for up to 10 seconds between repetitions.  4. Repeat 8 to 12 times.    Heel raises   1. Stand with your feet a few inches apart, with your hands lightly resting on a counter or chair in front of you.  2. Slowly raise your heels off the floor while keeping your knees straight.  3. Hold for about 6 seconds, then slowly lower your heels to the floor.  4. Do 8 to 12 repetitions several times during the day.    Heel dig bridging   Stop doing this exercise if it causes pain.  1. Lie on your back with both knees bent and your ankles bent so that only your heels are digging into the floor. Your knees should be bent about 90 degrees.  2. Then push your heels into the floor, squeeze your buttocks, and lift your hips off the floor until your shoulders, hips, and knees are all in a straight line.  3. Hold for about 6 seconds as you continue to breathe normally, and then slowly lower your hips back down to the floor and rest for up to 10 seconds.  4. Do 8 to 12 repetitions.    Shallow standing  knee bends   Do this exercise only if you have very little pain; if you have no clicking, locking, or giving way in the injured knee; and if it does not hurt while you are doing 8 to 12 repetitions.  1. Stand with your hands lightly resting on a counter or chair in front of you. Put your feet shoulder-width apart.  2. Slowly bend your knees so that you squat down like you are going to sit in a chair. Make sure your knees do not go in front of your toes.  3. Lower yourself about 6 inches. Your heels should remain on the floor at all times.  4. Rise slowly to a standing position.    Follow-up care is a key part of your treatment and safety. Be sure to make and go to all appointments, and call your doctor if you are having problems. It's also a good idea to know your test results and keep a list of the medicines you take.  Where can you learn more?  Go to [facility] at [email or web address]  [facility] [employer]. Enter C183 in the search box to learn more about Meniscus Tear: Exercises.  Current as of: August 06, 2018??????????????????????????????Content Version: 12.5  ?? 2006-2020 [employer].   Care instructions adapted under license by [facility]. If you have questions about a medical condition or this instruction, always ask your healthcare professional. [employer], [employer] disclaims any warranty or liability for your use of this information.             Patient Education        Iliotibial Band Syndrome: Exercises  Introduction  Here are some examples of exercises for you to try. The exercises may be suggested for a condition or for rehabilitation. Start each exercise slowly. Ease off the exercises if you start to have pain.  You will be told when to start these exercises and which ones will work best for you.  How to do the exercises  Iliotibial band stretch   1. Lean sideways against a wall. If you are not steady on your feet, hold on to a chair or counter.  2. Stand on the leg with the affected hip, with that leg close to the wall. Then cross your other leg in front of it.  3. Let your affected hip drop out to the side of your body and against the wall. Then lean away from your affected hip until you feel a stretch.  4. Hold the stretch for 15 to 30 seconds.  5. Repeat 2 to 4 times.    Piriformis stretch   1. Lie on your back with your legs straight.  2. Lift your affected leg and bend your knee. With your opposite hand, reach across your body, and then gently pull your knee toward your opposite shoulder.  3. Hold the stretch for 15 to 30 seconds.  4. Repeat 2 to 4 times.    Hamstring wall stretch   1. Lie on your back in a doorway, with your good leg through the open door.  2. Slide your affected leg up the wall to straighten your knee. You should feel a gentle stretch down the back of your leg.  3. Hold the stretch for at least 1 minute to begin. Then try to lengthen the time you hold the stretch to as long as 6 minutes.  4. Repeat 2 to 4 times.  5. If you do not have a place to  do this exercise in a doorway, there is another way to do it:  6. Lie on your back, and bend the knee of your affected leg.  7. Loop a towel under the ball and toes of that foot, and hold the ends of the towel in your hands.  8. Straighten your knee, and slowly pull back on the towel. You should feel a gentle stretch down the back of your leg.  9. Hold the stretch for 15 to 30 seconds. Or even better, hold the stretch for 1 minute if you can.  10. Repeat 2 to 4 times.  1. Do not arch your back.  2. Do not bend either knee.  3. Keep one heel touching the floor and the other heel touching the wall. Do not point your toes.    Follow-up care is a key part of your treatment and safety. Be sure to make and go to all appointments, and call your doctor if you are having problems. It's also a good idea to know your test results and keep a list of the medicines you take.  Where can you learn more?  Go to [facility] at [email or web address]  [facility] [employer]. Enter P252 in the search box to learn more about Iliotibial Band Syndrome: Exercises.  Current as of: August 06, 2018??????????????????????????????Content Version: 12.5  ?? 2006-2020 [employer].   Care instructions adapted under license by [facility] If you have questions about a medical condition or this instruction, always ask your healthcare professional. [employer], [employer] disclaims any warranty or liability for your use of this information.

## 2018-12-04 NOTE — Unmapped (Signed)
Patient with 2 months of progressive pain of left knee. No identifiable trigger, although she does recall one time in which she stepped off a ladder in a weird way 2 months ago. Has had pain mainly along lateral knee, occasionally medial as well. Radiates up to her hip and down the lateral aspect of her leg. Has been worsening. Now has pain even when sitting. Twisting motions of her knee, walking up stairs, squatting make the pain worse. No popping, clicking. Does occasionally have buckling sensation, but knee has not given out. No swelling that she has noticed. Has tried Aleve, which helps, and topical creams and Voltaren. Exam notable for tenderness along IT band and at insertion point of IT band at knee, positive Ober's test, positive McMurray's, and positive Thessaly's test. 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 Will trial PT and conservative therapy first, and patient may need MRI in the future, which she understands.  - PT referral placed  - Aleve BID  - IT band exercises and mensicus injury exercises provided in AVS  - Encouraged to try foam rolling IT band, heat or ice to see if that relieves pain  - Follow up after PT

## 2018-12-06 NOTE — Unmapped (Signed)
Immediately after or during the visit, I reviewed with the resident the medical history and the resident???s findings on physical examination.  I discussed with the resident the patient???s diagnosis and concur with the treatment plan as documented in the resident note. Kalii Chesmore, MD

## 2018-12-18 NOTE — Unmapped (Signed)
[facility]  OUTPATIENT PHYSICAL THERAPY  12/19/2018  Note Type: Evaluation       Patient Name: Amber Carson  Date of Birth:Jul 24, 1975  Visit #: 1 (1/10 Progress Note)  Diagnosis:   Encounter Diagnoses   Name Primary?   ??? Acute medial meniscus tear of left knee, subsequent encounter Yes   ??? Acute pain of left knee    ??? Chronic pain of left knee    ??? Gait abnormality    ??? Impaired strength of lower extremity      Referring MD:  Erenest Rasher*   Initial Evaluation Date: 12/19/18    Plan of Care Effective Date:       Assessment & Plan     Assessment details: 43 y.o. female presents with left knee dysfunction which is limiting their ability to perform the functional activities listed below.  Based on patient's presentation in clinic today, signs and symptoms appear to be consistent with meniscal tear of the left knee.  The patient presents with associated impairments, including left knee pain, LLE weakness, increased edema, decreased gait quality, and decreased load/activity tolerance.  This patient requires skilled physical therapy services to address the outlined impairments in order to return to their desired level of function.      Impairments: decreased mobility, decreased strength, gait deviation, impaired bed mobility, impaired transfers, pain, decreased endurance, poor awareness of body mechanics and poor sensory processing        Personal Factors/Comorbidities: 2  Specific Comorbidities: Increased body habitus; L knee pains    Body System: Musculoskeletal   Clinical Presentation: stable  Clinical Decision Making: low    Prognosis: fair      Positive Prognosis Rationale: chronicity of condition and strength.   Negative Prognosis Rationale: Pain Status and body habitus.        Therapy Goals  Goals: Short Term Goals:  In 3 weeks:  1) Pt will be ind with HEP and able to demo x1 in order to make progressive gains in function between PT visits.  2) Pt will have involved knee AROM & PROM that is WNL and comparable to the contralateral LE in order to return to LE dominant ADLs with reduced pain and dsyfunction.  3) Pt will have no tenderness to palpation at the posterior lateral left HS muscle tendon unit demonstrating lower irritability level of involved structures.      Long Term Goals:  In 12 weeks:  1) Pt will increase FOTO score to >/= to the predicted level demonstrating a significant improvement in function since beginning PT.  2) Pt will have 5/5 strength in the involved LE in all deficient planes of motion in order to return to LE dominant ADLs and recreation without pain.  3) Pt will be able to perform a bilat squat 2 x10 with good biomechanics and without pain in order to return to LE dominant ADLs and recreation without pain.  4) Pt will be able to return to desired recreation, including starting a walk/run program, without pain or dysfunction.      Plan  Therapy options: will be seen for skilled physical therapy services    Planned therapy interventions: Aquatic Therapy, body mechanics training, compression bandaging, Cryotherapy, Diaphragmatic/Pursed-lip breathing, education - patient, endurance activites, E-stim, functional mobility, gait training, home exercise program, Ultrasound, transfer training, traction, therapeutic exercises, therapeutic activities, TENs, taping, self-care / home training, postural training, neuromuscular re-education and manual therapy      PT/DME Equipment:  walker (rolling), compression garments and crutches (Recommended either AD).  Frequency: 1x week    Duration in weeks: 5    Education provided to: patient.    Education Results: needs reinforcement and verbalized good understanding.    Next visit plan: [place] therex, manual for pain relief      Plan details: See for 3-5 visits, if no improvement refer to Ortho for MRI          Subjective     History of Present Illness  Date of Onset: 10/18/2018    Date of Evaluation: 12/19/2018    Reason for Referral/Chief Complaint: Patient presents to OPPT with c/o left knee pains for ~2 months. Pt reports that two months ago she was coming off of a two step step and twisted, with pain the next day to the left knee. Pt denies previous left knee pain. Pt reports mostly postero-lateral upper L thigh tightness, that can go below the knee. Pt reports with her leg dangling her left knee she has posterior knee pain. When she walks a lot it can click and pop, but it has not done that since. Pt reports going up stairs is worse than coming down. Pt reports if she sits criss cross or too long she takes some time to get. Pt reports she has 6 lumbar vertebrae and and a hip malalignment that she can feel from time to time with walking.              Pain      Current pain rating: 6      At best pain rating: 3      At worst pain rating: 10      Location: L knee, lateral      Quality: tightness and aching      Relieving factors: rest, elevation and medications      Aggravating factors: sitting, on the move, stairs and other (driving, lifting the leg medially)      Pain Related Behaviors: irritable    Progression: no change      Red flags: none            Current functional status: exercise, limited standing tolerance, limited walking tolerance, limited recreation, leisure activities, difficulty with transfers, disturbed sleep and limited sitting tolerance      Precautions and Equipment  Precautions: None  Current Braces/Orthoses: None  Equipment Currently Used: None    Social Support  Communication Preference: verbal, written and visual   Barriers to Learning: No Barriers  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[employer]    Diagnostic Tests  No diagnostic tests performed                                  Treatments      Previous treatment: medication and other (Tried exercise/walking)                      Patient Goals  Patient goals for therapy: decreased pain, decreased edema, return to recreational activites, return to sport/leisure activities, increased strength, improved standing tolerance, improved ambulation and improved sleep      Patient goal: Patient would like to be able to get back to running; And would like to be able to get on and off the floor without holding onto anything          Objective    Gait Analysis:  Antalgis, increased LLE offloading, decreased RLE  step length and LLE stance time    Static Posture & Observation:   Sitting:   Standing: offloading to the RLE noted  Skin assessment, muscle tone/bulk & bony contour: WNL; increased adipose noted to the BLEs  Edema: posterior knee edema swelling noted to the LLE    Palpation/Segmental Motion/Joint Play:  Tibiofemoral: Increased mild crepitus noted with anterior drawer of the LLE and passive knee flexion  Patellofemoral:  WNL PROM without noted TTP or crepitus  Proximal Tibiofibular:   Distal Tibiofibular:   Talocrural:   Coxofemoral:   Tenderness to Palpation: TTP to the lateral HS muscle tendon unit, posterior L knee, predominantly the lateral L posterior knee compartment; Lateral L thigh over greater trochanter and ITB w/o reproducible patient symptom sign    Knee ROM  Motion L  AROM L  PROM R  AROM R  PROM Symptom/  End Feel Movement Analysis   Flexion Not measured 2/2 pain  WNL      Extension Full ext  WNL           Strength (MMT) & Handheld Dynamometer:  LE MMT Left   (/5) Right  (/5) HHD  (lbs) Symptoms Movement Analysis   Hip Flex:  (L2) 4+ 4+      Hip Ext:        Hip ABD:        Hip ADD: 5 5      Hip IR: 4 4+  P! Lateral L knee    Hip ER: 4 4+  P! Lateral L knee    Knee Ext:  (L3) 4+ 5      Knee Flex: (S2) 2+ 5  P! L knee, lateral and up thigh Minimal L HS engagement   Ankle DF:  (L4) 5 5      Ankle PF:  (S1) 5 5      Ankle Inv:        Ankle Ev:        Great Toe Ext: (L5)          Special Tests:   Left Right Notes   One-plane medial instability:       Hughston's valgus stress @ 0 & 30      Valgus stress @ 0 & 30 -     One-plan lateral instability:       Hughston's varus stress @ 0 & 30      Varus stress @ 0 & 30 -     One-plane anterior instability:       Active drawer       Drawer  -  Crepitus noted with testing to the L knee, slight increased laxity vs. The RLE; Improved subjective patient pain experience   Lachman test or its modifications      One-plane posterior instability:      Active drawer       Drawer -  Worsened L knee tenderness noted   Godfrey      Posterior Sag      Anteromedial rotatory instability:      Slocum      Anterolateral rotatory instability:      Crossover      Jerk test of Hughson      Losee test      Noyes flexion-rotation drawer test      Pivot shift       [place] ARLI       Posteromedial rotary instability:      Hughston's posteromedial drawer  Posteromedial pivot shift       Posterolateral rotary instability:      External rotation recurvatum      Hughston's posterolateral drawer       Jakob test       Tibial external rotation (dial)      Loomer's posterolateral rotary instability       Meniscus lesions:      Apley's  +  Both compress and distraction   Ege's       McMurray      Thessaly + - Lateral L knee pains   Plica lesions:       Hughston's plica       Mediopatellar plica      Plica stutter      Swelling:       Brush test       Fluctuation       Indentation       Patellar tap test (mod swelling)      Patellofemoral syndrome:      Clarke's sign      McConnell      Step-up      Quadriceps pull:       Q-angle       Tubercle sulcus       Patellar instability:      Fairbank's apprehension       Moving patellar apprehension      ITB Friction Syndrome:       Noble compression       Muscle Tightness:       Ober's Test for TFL/ITB      Ely's Test for Rectus Femoris      Rectus Femoris Contracture      Hip Rotator Tightness       90/90 Straight Leg Raise       Abduction Contracture      Adduction Contracture       Thomas Test for Hip Flexor Contracture          Sensation:   Gross sensation screen: Intact    Reflexes:   Not Tested    0: absent reflex 1+: trace, or seen only with reinforcement  2+: normal  3+: brisk  4+: nonsustained clonus (i.e., repetitive vibratory movements)  5+: sustained clonus    Functional Outcome Measure:  FOTO:   :   5 x sit to stand:  TUG:       PT Evaluation: 46 minutes    Additional patient education: examination findings, pathology & intervention, involved anatomy, exercise rationale, plan of care, role of physical therapist, goals, RICE, soft bracing of the L knee, and offloading of the L knee with amb using either RW or axillary crutch    Total Treatment Time:  46 minutes    Patient Education:   Throughout evaluation patient educated regarding the following: Role of PT in Rehabilitation, importance of therapy, body mechanics, treatment plan and Indications/Contraindications to Exercises. Patient demonstrated and verbalized agreement and understanding.  See treatment rendered section above for additional educational topics reviewed with patient, family and/or caregiver.      Communication/consultation with other professionals:  N/A    Equipment provided/recommended:   RW, axillary crutch recommended      I attest that I have reviewed the above information.  Signed: Fredrik Cove, PT  12/19/2018 4:48 PM

## 2018-12-19 ENCOUNTER — Encounter
Admit: 2018-12-19 | Discharge: 2019-01-17 | Payer: BLUE CROSS/BLUE SHIELD | Attending: Rehabilitative and Restorative Service Providers" | Primary: Rehabilitative and Restorative Service Providers"

## 2018-12-19 ENCOUNTER — Ambulatory Visit
Admit: 2018-12-19 | Discharge: 2019-01-17 | Payer: BLUE CROSS/BLUE SHIELD | Attending: Rehabilitative and Restorative Service Providers" | Primary: Rehabilitative and Restorative Service Providers"

## 2018-12-19 DIAGNOSIS — G8929 Other chronic pain: Secondary | ICD-10-CM

## 2018-12-19 DIAGNOSIS — S83242D Other tear of medial meniscus, current injury, left knee, subsequent encounter: Principal | ICD-10-CM

## 2018-12-19 DIAGNOSIS — M25562 Pain in left knee: Secondary | ICD-10-CM

## 2018-12-19 DIAGNOSIS — R29898 Other symptoms and signs involving the musculoskeletal system: Secondary | ICD-10-CM

## 2018-12-19 DIAGNOSIS — R269 Unspecified abnormalities of gait and mobility: Secondary | ICD-10-CM

## 2018-12-19 NOTE — Unmapped (Signed)
[facility] Clinical Assessment & Refill Coordination Note    Amber Carson, DOB: February 16, 1976  Phone: [phone] (home) [phone] (work)    All above HIPAA information was verified with patient.     Specialty Medication(s):   Neurology: Emgality     Current Outpatient Medications   Medication Sig Dispense Refill   ??? CAMILA 0.35 mg tablet      ??? cetirizine HCl (ZYRTEC ORAL) Take by mouth.     ??? cyclobenzaprine (FLEXERIL) 5 MG tablet Take 1 tablet (5 mg total) by mouth Three (3) times a day. 60 tablet 1   ??? empty container Misc Use as directed 1 each PRN   ??? fluticasone (FLONASE) 50 mcg/actuation nasal spray 1 spray by Each Nare route daily.     ??? gabapentin (NEURONTIN) 300 MG capsule Take 1 capsule (300 mg total) by mouth Three (3) times a day. 90 capsule 1   ??? galcanezumab-gnlm (EMGALITY) 120 mg/mL injection Inject 1 ml (120 mg) under the skin every thirty (30) days. 1 mL 11   ??? HYDROcodone-acetaminophen (NORCO) 5-325 mg per tablet Take 1 tablet by mouth daily as needed for pain. 30 tablet 0   ??? hydrocortisone (ANUSOL-HC) 25 mg suppository Unwrap and Insert 1 suppository (25 mg total) into the rectum two (2) times a day as needed for hemorrhoids. 20 suppository 2   ??? LORazepam (ATIVAN) 1 MG tablet Take 1 tablet (1 mg total) by mouth daily as needed for anxiety. 30 tablet 1   ??? magnesium 200 mg Tab Take 1 capsule by mouth.     ??? multivitamin (TAB-A-VITE/THERAGRAN) per tablet Take 1 tablet by mouth daily.     ??? pseudoephedrine HCl (SUDAFED ORAL) Take by mouth.     ??? QUEtiapine (SEROQUEL) 25 MG tablet Take 1 to 2 tablets (25-50 MG) daily as needed for severe migraine for sleep. DO NOT DRIVE or operate machinery/childcare when taking this medication. 30 tablet 0   ??? ranitidine (ZANTAC) 150 MG capsule Take 150 mg by mouth daily as needed for heartburn.     ??? rizatriptan (MAXALT-MLT) 10 MG disintegrating tablet Take 1 tablet by mouth.  May repeat in 2 hours if needed for migraine. NO more than 2 tablets in 24 hours. 12 tablet 11   ??? topiramate (TOPAMAX) 25 MG tablet Take 2 tablets (50 mg total) by mouth nightly. 60 tablet 2   ??? venlafaxine (EFFEXOR XR) 75 MG 24 hr capsule Take 1 capsule (75 mg total) by mouth daily. 90 capsule 3   ??? zolpidem (AMBIEN) 5 MG tablet Take 1/2 tablet (2.5 mg total) by mouth nightly as needed for sleep. 30 tablet 0     Current Facility-Administered Medications   Medication Dose Route Frequency Provider Last Rate Last Dose   ??? triamcinolone acetonide (KENALOG-40) injection 80 mg  80 mg Intra-articular Once Cassell Smiles, [place]            Changes to medications: [name] reports no changes at this time.    Allergies   Allergen Reactions   ??? Nsaids (Non-Steroidal Anti-Inflammatory Drug) Other (See Comments)       Changes to allergies: Yes: generic topamax (topiramate)- gets leg/feet swelling, cramps, and pain    SPECIALTY MEDICATION ADHERENCE     Emgality 120 mg/ml: 0 days of medicine on hand     Medication Adherence    Specialty Medication: emgality 120mg /ml  Patient is on additional specialty medications: No  Adherence tools used: calendar  Specialty medication(s) dose(s) confirmed: Regimen is correct and unchanged.     Are there any concerns with adherence? No    Adherence counseling provided? Not needed    CLINICAL MANAGEMENT AND INTERVENTION      Clinical Benefit Assessment:    Do you feel the medicine is effective or helping your condition? Yes    Clinical Benefit counseling provided? Progress note from 11/29/18 shows evidence of clinical benefit    Adverse Effects Assessment:    Are you experiencing any side effects? No    Are you experiencing difficulty administering your medicine? No    Quality of Life Assessment:    How many days over the past month did your migraine  keep you from your normal activities? For example, brushing your teeth or getting up in the morning. 1    Have you discussed this with your provider? Not needed    Therapy Appropriateness:    Is therapy appropriate? Yes, therapy is appropriate and should be continued    DISEASE/MEDICATION-SPECIFIC INFORMATION      For patients on injectable medications: Patient currently has 0 doses left.  Next injection is scheduled for 12/31/18.    PATIENT SPECIFIC NEEDS     ? Does the patient have any physical, cognitive, or cultural barriers? No    ? Is the patient high risk? No     ? Does the patient require a Care Management Plan? No     ? Does the patient require physician intervention or other additional services (i.e. nutrition, smoking cessation, social work)? No      SHIPPING     Specialty Medication(s) to be Shipped:   Neurology: Emgality    Other medication(s) to be shipped: none     Changes to insurance: No    Delivery Scheduled: Yes, Expected medication delivery date: 12/26/18.     Medication will be delivered via UPS to the confirmed home address in [facility].    The patient will receive a drug information handout for each medication shipped and additional FDA Medication Guides as required.  Verified that patient has previously received a Conservation officer, historic buildings.    All of the patient's questions and concerns have been addressed.    [name] [facility] Specialty Pharmacist

## 2018-12-25 MED FILL — EMGALITY PEN 120 MG/ML SUBCUTANEOUS PEN INJECTOR: SUBCUTANEOUS | 30 days supply | Qty: 1 | Fill #9

## 2018-12-25 MED FILL — EMGALITY PEN 120 MG/ML SUBCUTANEOUS PEN INJECTOR: 30 days supply | Qty: 1 | Fill #9 | Status: AC

## 2018-12-28 NOTE — Unmapped (Signed)
[facility]   OUTPATIENT PHYSICAL THERAPY  01/02/2019          Patient Name: Amber Carson  Date of Birth:1975/09/02  Visit #: 2 (2/10 Progress Note)  Diagnosis:   Encounter Diagnoses   Name Primary?   ??? Acute medial meniscus tear of left knee, subsequent encounter Yes   ??? Acute pain of left knee    ??? Chronic pain of left knee    ??? Gait abnormality    ??? Impaired strength of lower extremity      Referring MD:  Erenest Rasher*   Initial Evaluation Date: 12/19/18    Plan of Care Effective Date:       Assessment & Plan     Assessment details: Patient still presenting with antalgic gait quality, and increased L knee pain now donning L knee neoprene sleeve with perceived benefit. Pt with pronounced hypertonicity to the LLE vastus lateralis with symptom repro, so PT focused on STM and TP release to these tissues with the hopes of relieving pt baseline pain. Moderately successful, with patient lateral and some posterior structures quite irritable at this time. PT to continue to address these issues with future sessions. Lacrosse ball reviewed. Continue with skilled PT POC to address remaining patient functional deficits, meet pt goals with PT POC, and optimize patient level of function on discharge.      Impairments: decreased mobility, decreased strength, gait deviation, impaired bed mobility, impaired transfers, pain, decreased endurance, poor awareness of body mechanics and poor sensory processing        Personal Factors/Comorbidities: 2  Specific Comorbidities: Increased body habitus; L knee pains    Body System: Musculoskeletal   Clinical Presentation: stable  Clinical Decision Making: low    Prognosis: fair      Positive Prognosis Rationale: chronicity of condition and strength.   Negative Prognosis Rationale: Pain Status and body habitus.        Therapy Goals  Goals: Short Term Goals:  In 3 weeks:  1) Pt will be ind with HEP and able to demo x1 in order to make progressive gains in function between PT visits.  2) Pt will have involved knee AROM & PROM that is WNL and comparable to the contralateral LE in order to return to LE dominant ADLs with reduced pain and dsyfunction.  3) Pt will have no tenderness to palpation at the posterior lateral left HS muscle tendon unit demonstrating lower irritability level of involved structures.      Long Term Goals:  In 12 weeks:  1) Pt will increase FOTO score to >/= to the predicted level demonstrating a significant improvement in function since beginning PT.  2) Pt will have 5/5 strength in the involved LE in all deficient planes of motion in order to return to LE dominant ADLs and recreation without pain.  3) Pt will be able to perform a bilat squat 2 x10 with good biomechanics and without pain in order to return to LE dominant ADLs and recreation without pain.  4) Pt will be able to return to desired recreation, including starting a walk/run program, without pain or dysfunction.      Plan  Therapy options: will be seen for skilled physical therapy services    Planned therapy interventions: Aquatic Therapy, body mechanics training, compression bandaging, Cryotherapy, Diaphragmatic/Pursed-lip breathing, education - patient, endurance activites, E-stim, functional mobility, gait training, home exercise program, Ultrasound, transfer training, traction, therapeutic exercises, therapeutic activities, TENs, taping, self-care /  home training, postural training, neuromuscular re-education and manual therapy      PT/DME Equipment: walker (rolling), compression garments and crutches (Recommended either AD).  Frequency: 1x week    Duration in weeks: 5    Education provided to: patient.    Education Results: needs reinforcement and verbalized good understanding.    Next visit plan: [place]C therex, manual for pain relief      Plan details: See for 3-5 visits, if no improvement refer to Ortho for MRI          Subjective     History of Present Illness  Date of Onset: 10/18/2018    Date of Evaluation: 12/19/2018    Reason for Referral/Chief Complaint: Patient presents to OPPT with c/o left knee pains for ~2 months. Pt reports that two months ago she was coming off of a two step step and twisted, with pain the next day to the left knee. Pt denies previous left knee pain. Pt reports mostly postero-lateral upper L thigh tightness, that can go below the knee. Pt reports with her leg dangling her left knee she has posterior knee pain. When she walks a lot it can click and pop, but it has not done that since. Pt reports going up stairs is worse than coming down. Pt reports if she sits criss cross or too long she takes some time to get. Pt reports she has 6 lumbar vertebrae and and a hip malalignment that she can feel from time to time with walking.      Subjective: Patient reports she is now working from home now and able to elevate the leg when not working. Pt reports she is now wearing a neoprene L knee sleeve which she feels supported. 10/10 p! With amb, 5/10 with sitting/laying. Pt got a massage last weekend which really seemed to help.          Pain      Current pain rating: 5              Location: L knee, lateral, inferior      Quality: tightness and aching      Relieving factors: rest, elevation and medications      Aggravating factors: sitting, on the move, stairs and other (driving, lifting the leg medially)      Pain Related Behaviors: irritable    Progression: no change      Red flags: none            Current functional status: exercise, limited standing tolerance, limited walking tolerance, limited recreation, leisure activities, difficulty with transfers, disturbed sleep and limited sitting tolerance      Precautions and Equipment  Precautions: None  Current Braces/Orthoses: None  Equipment Currently Used: None    Social Support  Communication Preference: verbal, written and visual   Barriers to Learning: No Barriers  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 [employer]  No diagnostic tests performed                                  Treatments      Previous treatment: medication and other (Tried exercise/walking)                      Patient Goals  Patient goals for therapy: decreased pain, decreased edema, return to recreational activites, return to sport/leisure activities, increased strength, improved standing tolerance, improved ambulation and improved sleep  Patient goal: Patient would like to be able to get back to running; And would like to be able to get on and off the floor without holding onto anything          Objective    Previous Session 12/19/18:  Gait Analysis:  Antalgis, increased LLE offloading, decreased RLE step length and LLE stance time    Static Posture & Observation:   Sitting:   Standing: offloading to the RLE noted  Skin assessment, muscle tone/bulk & bony contour: WNL; increased adipose noted to the BLEs  Edema: posterior knee edema swelling noted to the LLE    Palpation/Segmental Motion/Joint Play:  Tibiofemoral: Increased mild crepitus noted with anterior drawer of the LLE and passive knee flexion  Patellofemoral:  WNL PROM without noted TTP or crepitus  Proximal Tibiofibular:   Distal Tibiofibular:   Talocrural:   Coxofemoral:   Tenderness to Palpation: TTP to the lateral HS muscle tendon unit, posterior L knee, predominantly the lateral L posterior knee compartment; Lateral L thigh over greater trochanter and ITB w/o reproducible patient symptom sign    Knee ROM  Motion L  AROM L  PROM R  AROM R  PROM Symptom/  End Feel Movement Analysis   Flexion Not measured 2/2 pain  WNL      Extension Full ext  WNL           Strength (MMT) & Handheld Dynamometer:  LE MMT Left   (/5) Right  (/5) HHD  (lbs) Symptoms Movement Analysis   Hip Flex:  (L2) 4+ 4+      Hip Ext:        Hip ABD:        Hip ADD: 5 5      Hip IR: 4 4+  P! Lateral L knee    Hip ER: 4 4+  P! Lateral L knee    Knee Ext:  (L3) 4+ 5      Knee Flex: (S2) 2+ 5  P! L knee, lateral and up thigh Minimal L HS engagement   Ankle DF:  (L4) 5 5      Ankle PF:  (S1) 5 5      Ankle Inv:        Ankle Ev:        Great Toe Ext: (L5)          Special Tests:   Left Right Notes   One-plane medial instability:       Hughston's valgus stress @ 0 & 30      Valgus stress @ 0 & 30 -     One-plan lateral instability:       Hughston's varus stress @ 0 & 30      Varus stress @ 0 & 30 -     One-plane anterior instability:       Active drawer       Drawer  -  Crepitus noted with testing to the L knee, slight increased laxity vs. The RLE; Improved subjective patient pain experience   Lachman test or its modifications      One-plane posterior instability:      Active drawer       Drawer -  Worsened L knee tenderness noted   Godfrey      Posterior Sag      Anteromedial rotatory instability:      [place]um      Anterolateral rotatory instability:      Crossover  Jerk test of Hughson      Losee test      Noyes flexion-rotation drawer test      Pivot shift       [place] ARLI       Posteromedial rotary instability:      Hughston's posteromedial drawer      Posteromedial pivot shift       Posterolateral rotary instability:      External rotation recurvatum      Hughston's posterolateral drawer       Jakob test       Tibial external rotation (dial)      Loomer's posterolateral rotary instability       Meniscus lesions:      Apley's  +  Both compress and distraction   Ege's       McMurray      Thessaly + - Lateral L knee pains   Plica lesions:       Hughston's plica       Mediopatellar plica      Plica stutter      Swelling:       Brush test       Fluctuation       Indentation       Patellar tap test (mod swelling)      Patellofemoral syndrome:      Clarke's sign      McConnell      Step-up      Quadriceps pull:       Q-angle       Tubercle sulcus       Patellar instability:      Fairbank's apprehension       Moving patellar apprehension      ITB Friction Syndrome:       Noble compression       Muscle Tightness: Ober's Test for TFL/ITB      Ely's Test for Rectus Femoris      Rectus Femoris Contracture      Hip Rotator Tightness       90/90 Straight Leg Raise       Abduction Contracture      Adduction Contracture       Thomas Test for Hip Flexor Contracture          Sensation:   Gross sensation screen: Intact    Reflexes:   Not Tested    0: absent reflex  1+: trace, or seen only with reinforcement  2+: normal  3+: brisk  4+: nonsustained clonus (i.e., repetitive vibratory movements)  5+: sustained clonus    Functional Outcome Measure:  FOTO:   :   5 x sit to stand:  TUG:     Treatment Rendered:      Therapeutic Exercise: 30 Minutes  UBE x5 mins  SL hip abduction x10 10 reps with manual facilitation  SL Clamshells with green TB, 2x10 (added to HEP)  Seated Hip ER with green TB 2x10 reps (added to HEP)  Prone knee flex stretch with strap, 2x1 min (added to HEP)    Manual Therapy: 15 Minutes  Passive mobilization of the L knee into physiologic flex/ext  STM to the vastus lateralis with and w/o therastick  TP release to TPs in the vastus lateral LLE, with and w/o TP release tool  TP release to the lateral LLE HS with posterior symptom re-pro in prone    Total Treatment Time:  45 minutes      Patient Education:   Throughout evaluation  patient educated regarding the following: Role of PT in Rehabilitation, importance of therapy, body mechanics, treatment plan and Indications/Contraindications to Exercises. Patient demonstrated and verbalized agreement and understanding.  See treatment rendered section above for additional educational topics reviewed with patient, family and/or caregiver.        Equipment provided/recommended:   Chilton Si TB, HEP reviewed    I attest that I have reviewed the above information.  Signed: Fredrik Cove, PT  01/02/2019 3:46 PM

## 2019-01-01 ENCOUNTER — Encounter: Admit: 2019-01-01 | Discharge: 2019-01-02 | Payer: BLUE CROSS/BLUE SHIELD

## 2019-01-01 DIAGNOSIS — G8929 Other chronic pain: Secondary | ICD-10-CM

## 2019-01-01 DIAGNOSIS — M25562 Pain in left knee: Principal | ICD-10-CM

## 2019-01-02 DIAGNOSIS — M25562 Pain in left knee: Secondary | ICD-10-CM

## 2019-01-02 DIAGNOSIS — G8929 Other chronic pain: Secondary | ICD-10-CM

## 2019-01-02 DIAGNOSIS — R29898 Other symptoms and signs involving the musculoskeletal system: Secondary | ICD-10-CM

## 2019-01-02 DIAGNOSIS — S83242D Other tear of medial meniscus, current injury, left knee, subsequent encounter: Principal | ICD-10-CM

## 2019-01-02 DIAGNOSIS — R269 Unspecified abnormalities of gait and mobility: Secondary | ICD-10-CM

## 2019-01-02 NOTE — Unmapped (Signed)
[facility] THERAPY SERVICES [facility] [place]  OUTPATIENT PHYSICAL THERAPY  01/04/2019          Patient Name: Amber Carson  Date of Birth:1975-10-08  Visit #: 3 (3/10 Progress Note)  Diagnosis:   Encounter Diagnoses   Name Primary?   ??? Acute medial meniscus tear of left knee, subsequent encounter Yes   ??? Acute pain of left knee    ??? Chronic pain of left knee    ??? Gait abnormality    ??? Impaired strength of lower extremity      Referring MD:  Erenest Rasher*   Initial Evaluation Date: 12/19/18    Plan of Care Effective Date:       Assessment & Plan     Assessment details: Patient presents with improving overall LLE pain symptoms, reduced antalgic quality to gait, and improved activity tolerance. PT able to progress STM to include LE foam rolling and some CKC therex including squatting with tolerance. PT to focus on lateral LLE STM and hip strengthening for patient's remaining PT sessions ->CKC. Continue with skilled PT POC to address remaining patient functional deficits, meet pt goals with PT POC, and optimize patient level of function on discharge.      Impairments: decreased mobility, decreased strength, gait deviation, impaired bed mobility, impaired transfers, pain, decreased endurance, poor awareness of body mechanics and poor sensory processing        Personal Factors/Comorbidities: 2  Specific Comorbidities: Increased body habitus; L knee pains    Body System: Musculoskeletal   Clinical Presentation: stable  Clinical Decision Making: low    Prognosis: fair      Positive Prognosis Rationale: chronicity of condition and strength.   Negative Prognosis Rationale: Pain Status and body habitus.        Therapy Goals  Goals: Short Term Goals:  In 3 weeks:  1) Pt will be ind with HEP and able to demo x1 in order to make progressive gains in function between PT visits.  2) Pt will have involved knee AROM & PROM that is WNL and comparable to the contralateral LE in order to return to LE dominant ADLs with reduced pain and dsyfunction.  3) Pt will have no tenderness to palpation at the posterior lateral left HS muscle tendon unit demonstrating lower irritability level of involved structures.      Long Term Goals:  In 12 weeks:  1) Pt will increase FOTO score to >/= to the predicted level demonstrating a significant improvement in function since beginning PT.  2) Pt will have 5/5 strength in the involved LE in all deficient planes of motion in order to return to LE dominant ADLs and recreation without pain.  3) Pt will be able to perform a bilat squat 2 x10 with good biomechanics and without pain in order to return to LE dominant ADLs and recreation without pain.  4) Pt will be able to return to desired recreation, including starting a walk/run program, without pain or dysfunction.      Plan  Therapy options: will be seen for skilled physical therapy services    Planned therapy interventions: Aquatic Therapy, body mechanics training, compression bandaging, Cryotherapy, Diaphragmatic/Pursed-lip breathing, education - patient, endurance activites, E-stim, functional mobility, gait training, home exercise program, Ultrasound, transfer training, traction, therapeutic exercises, therapeutic activities, TENs, taping, self-care / home training, postural training, neuromuscular re-education and manual therapy      PT/DME Equipment: walker (rolling), compression garments and crutches (Recommended either AD).  Frequency: 1x  week    Duration in weeks: 5    Education provided to: patient.    Education Results: needs reinforcement and verbalized good understanding.    Next visit plan: [place] therex, manual for pain relief              Subjective     History of Present Illness  Date of Onset: 10/18/2018    Date of Evaluation: 12/19/2018    Reason for Referral/Chief Complaint: Patient presents to OPPT with c/o left knee pains for ~2 months. Pt reports that two months ago she was coming off of a two step step and twisted, with pain the next day to the left knee. Pt denies previous left knee pain. Pt reports mostly postero-lateral upper L thigh tightness, that can go below the knee. Pt reports with her leg dangling her left knee she has posterior knee pain. When she walks a lot it can click and pop, but it has not done that since. Pt reports going up stairs is worse than coming down. Pt reports if she sits criss cross or too long she takes some time to get. Pt reports she has 6 lumbar vertebrae and and a hip malalignment that she can feel from time to time with walking.      Subjective: Patient reports that she is getting better with less overall pain. No knee brace, walking better with less overall pain. Pt states that she felt much better after last session.          Pain      Current pain rating: 3              Location: L knee, lateral, inferior      Quality: tightness and aching      Relieving factors: rest, elevation and medications      Aggravating factors: sitting, on the move, stairs and other (driving, lifting the leg medially)      Pain Related Behaviors: irritable    Progression: no change      Red flags: none            Current functional status: exercise, limited standing tolerance, limited walking tolerance, limited recreation, leisure activities, difficulty with transfers, disturbed sleep and limited sitting tolerance      Precautions and Equipment  Precautions: None  Current Braces/Orthoses: None  Equipment Currently Used: None    Social Support  Communication Preference: verbal, written and visual   Barriers to Learning: No Barriers  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[employer]    Diagnostic Tests  No diagnostic tests performed                                  Treatments      Previous treatment: medication and other (Tried exercise/walking)                      Patient Goals  Patient goals for therapy: decreased pain, decreased edema, return to recreational activites, return to sport/leisure activities, increased strength, improved standing tolerance, improved ambulation and improved sleep      Patient goal: Patient would like to be able to get back to running; And would like to be able to get on and off the floor without holding onto anything          Objective    Gait Analysis:  Reduced antalgic quality, increased LLE stance time, no cane, no brace  Static Posture & Observation:   Skin assessment, muscle tone/bulk & bony contour: No significant innominate rotation appreciated, grossly WNL bilat pelvis appreciated, mild increased protrusion R ischeal tuberosity     Previous Session 12/19/18:  Gait Analysis:  Antalgis, increased LLE offloading, decreased RLE step length and LLE stance time    Static Posture & Observation:   Sitting:   Standing: offloading to the RLE noted  Skin assessment, muscle tone/bulk & bony contour: WNL; increased adipose noted to the BLEs  Edema: posterior knee edema swelling noted to the LLE    Palpation/Segmental Motion/Joint Play:  Tibiofemoral: Increased mild crepitus noted with anterior drawer of the LLE and passive knee flexion  Patellofemoral:  WNL PROM without noted TTP or crepitus  Proximal Tibiofibular:   Distal Tibiofibular:   Talocrural:   Coxofemoral:   Tenderness to Palpation: TTP to the lateral HS muscle tendon unit, posterior L knee, predominantly the lateral L posterior knee compartment; Lateral L thigh over greater trochanter and ITB w/o reproducible patient symptom sign    Knee ROM  Motion L  AROM L  PROM R  AROM R  PROM Symptom/  End Feel Movement Analysis   Flexion Not measured 2/2 pain  WNL      Extension Full ext  WNL           Strength (MMT) & Handheld Dynamometer:  LE MMT Left   (/5) Right  (/5) HHD  (lbs) Symptoms Movement Analysis   Hip Flex:  (L2) 4+ 4+      Hip Ext:        Hip ABD:        Hip ADD: 5 5      Hip IR: 4 4+  P! Lateral L knee    Hip ER: 4 4+  P! Lateral L knee    Knee Ext:  (L3) 4+ 5      Knee Flex: (S2) 2+ 5  P! L knee, lateral and up thigh Minimal L HS engagement   Ankle DF: (L4) 5 5      Ankle PF:  (S1) 5 5      Ankle Inv:        Ankle Ev:        Great Toe Ext: (L5)          Special Tests:   Left Right Notes   One-plane medial instability:       Hughston's valgus stress @ 0 & 30      Valgus stress @ 0 & 30 -     One-plan lateral instability:       Hughston's varus stress @ 0 & 30      Varus stress @ 0 & 30 -     One-plane anterior instability:       Active drawer       Drawer  -  Crepitus noted with testing to the L knee, slight increased laxity vs. The RLE; Improved subjective patient pain experience   Lachman test or its modifications      One-plane posterior instability:      Active drawer       Drawer -  Worsened L knee tenderness noted   Godfrey      Posterior Sag      Anteromedial rotatory instability:      [place]um      Anterolateral rotatory instability:      Crossover      Jerk test of Hughson      Losee test  Noyes flexion-rotation drawer test      Pivot shift       [place] ARLI       Posteromedial rotary instability:      Hughston's posteromedial drawer      Posteromedial pivot shift       Posterolateral rotary instability:      External rotation recurvatum      Hughston's posterolateral drawer       Jakob test       Tibial external rotation (dial)      Loomer's posterolateral rotary instability       Meniscus lesions:      Apley's  +  Both compress and distraction   Ege's       McMurray      Thessaly + - Lateral L knee pains   Plica lesions:       Hughston's plica       Mediopatellar plica      Plica stutter      Swelling:       Brush test       Fluctuation       Indentation       Patellar tap test (mod swelling)      Patellofemoral syndrome:      Clarke's sign      McConnell      Step-up      Quadriceps pull:       Q-angle       Tubercle sulcus       Patellar instability:      Fairbank's apprehension       Moving patellar apprehension      ITB Friction Syndrome:       Noble compression       Muscle Tightness:       Ober's Test for TFL/ITB      Ely's Test for Rectus Femoris      Rectus Femoris Contracture      Hip Rotator Tightness       90/90 Straight Leg Raise       Abduction Contracture      Adduction Contracture       Thomas Test for Hip Flexor Contracture          Sensation:   Gross sensation screen: Intact    Reflexes:   Not Tested    0: absent reflex  1+: trace, or seen only with reinforcement  2+: normal  3+: brisk  4+: nonsustained clonus (i.e., repetitive vibratory movements)  5+: sustained clonus    Functional Outcome Measure:  FOTO:   :   5 x sit to stand:  TUG:     Treatment Rendered:      Therapeutic Exercise: 25 Minutes  UBE x5 mins  SL hip abduction x10 reps with manual resistance  LAQs/HS Curl with manual resistance x15 reps  Standing Calf raises x20 reps PRN UE support (added to HEP)  Lateral wt shifting 2x1 min (added to HEP)  Mini-squat with theraband at knees, green TB, x10 reps (added to HEP)  PRN rest breaks    Manual Therapy: 20 Minutes  Patient educated on use of foam roller for soft-tissue and trigger point release to the L vastus lateralis and ITB with visual demonstration, explanation, rationale and with indep performance by patient. - 6 mins  Passive mobilization of the L knee into physiologic flex/ext  TP release to TPs in the vastus lateral LLE, with and w TP release tool    Total Treatment Time: 45 minutes  Patient Education:   Throughout evaluation patient educated regarding the following: Role of PT in Rehabilitation, importance of therapy, body mechanics, treatment plan and Indications/Contraindications to Exercises. Patient demonstrated and verbalized agreement and understanding.  See treatment rendered section above for additional educational topics reviewed with patient, family and/or caregiver.      Equipment provided/recommended:   Carman Ching, HEP emailed    I attest that I have reviewed the above information.  Signed: Fredrik Cove, PT  01/04/2019 11:00 AM

## 2019-01-04 DIAGNOSIS — R269 Unspecified abnormalities of gait and mobility: Secondary | ICD-10-CM

## 2019-01-04 DIAGNOSIS — R29898 Other symptoms and signs involving the musculoskeletal system: Secondary | ICD-10-CM

## 2019-01-04 DIAGNOSIS — M25562 Pain in left knee: Secondary | ICD-10-CM

## 2019-01-04 DIAGNOSIS — S83242D Other tear of medial meniscus, current injury, left knee, subsequent encounter: Principal | ICD-10-CM

## 2019-01-04 DIAGNOSIS — G8929 Other chronic pain: Secondary | ICD-10-CM

## 2019-01-11 NOTE — Unmapped (Signed)
[facility] THERAPY SERVICES [facility] [place]  OUTPATIENT PHYSICAL THERAPY  01/14/2019          Patient Name: Amber Carson  Date of Birth:[birth year]  Visit #: 4 (4/10 Progress Note)  Diagnosis:   Encounter Diagnoses   Name Primary?   ??? Acute medial meniscus tear of left knee, subsequent encounter Yes   ??? Acute pain of left knee    ??? Gait abnormality    ??? Impaired strength of lower extremity      Referring MD:  Erenest Rasher*   Initial Evaluation Date: 12/19/18    Plan of Care Effective Date:       Assessment & Plan     Assessment details: Patient presents after vacation with reported worsening L knee pain with clicking/popping. PT without noted knee popping/catch with passive knee mobilization, with ROM presumed [facility] with hands on manual therapy. PT able to advance patient to CKC hip strengthening and CKC stability work without noted increased L knee pain. Pt symptoms inconsistent. PT to reassess patient next session for progress towards goals and to address changes to PT POC PRN. Continue with skilled PT POC to address remaining patient functional deficits, meet pt goals with PT POC, and optimize patient level of function on discharge.      Impairments: decreased mobility, decreased strength, gait deviation, impaired bed mobility, impaired transfers, pain, decreased endurance, poor awareness of body mechanics and poor sensory processing        Personal Factors/Comorbidities: 2  Specific Comorbidities: Increased body habitus; L knee pains    Body System: Musculoskeletal   Clinical Presentation: stable  Clinical Decision Making: low    Prognosis: fair      Positive Prognosis Rationale: chronicity of condition and strength.   Negative Prognosis Rationale: Pain Status and body habitus.        Therapy Goals  Goals: Short Term Goals:  In 3 weeks:  1) Pt will be ind with HEP and able to demo x1 in order to make progressive gains in function between PT visits.  2) Pt will have involved knee AROM & PROM that is WNL and comparable to the contralateral LE in order to return to LE dominant ADLs with reduced pain and dsyfunction.  3) Pt will have no tenderness to palpation at the posterior lateral left HS muscle tendon unit demonstrating lower irritability level of involved structures.      Long Term Goals:  In 12 weeks:  1) Pt will increase FOTO score to >/= to the predicted level demonstrating a significant improvement in function since beginning PT.  2) Pt will have 5/5 strength in the involved LE in all deficient planes of motion in order to return to LE dominant ADLs and recreation without pain.  3) Pt will be able to perform a bilat squat 2 x10 with good biomechanics and without pain in order to return to LE dominant ADLs and recreation without pain.  4) Pt will be able to return to desired recreation, including starting a walk/run program, without pain or dysfunction.      Plan  Therapy options: will be seen for skilled physical therapy services    Planned therapy interventions: Aquatic Therapy, body mechanics training, compression bandaging, Cryotherapy, Diaphragmatic/Pursed-lip breathing, education - patient, endurance activites, E-stim, functional mobility, gait training, home exercise program, Ultrasound, transfer training, traction, therapeutic exercises, therapeutic activities, TENs, taping, self-care / home training, postural training, neuromuscular re-education and manual therapy      PT/DME Equipment: walker (rolling),  compression garments and crutches (Recommended either AD).  Frequency: 1x week    Duration in weeks: 5    Education provided to: patient.    Education Results: needs reinforcement and verbalized good understanding.    Next visit plan: Reassess next session              Subjective     History of Present Illness  Date of Onset: 10/18/2018    Date of Evaluation: 12/19/2018    Reason for Referral/Chief Complaint: Patient presents to OPPT with c/o left knee pains for ~2 months. Pt reports that two months ago she was coming off of a two step step and twisted, with pain the next day to the left knee. Pt denies previous left knee pain. Pt reports mostly postero-lateral upper L thigh tightness, that can go below the knee. Pt reports with her leg dangling her left knee she has posterior knee pain. When she walks a lot it can click and pop, but it has not done that since. Pt reports going up stairs is worse than coming down. Pt reports if she sits criss cross or too long she takes some time to get. Pt reports she has 6 lumbar vertebrae and and a hip malalignment that she can feel from time to time with walking.      Subjective: Patient reports she notes clicking/popping to the L knee with bending. Pt reports that she went to the beach and was very active, patient denies falls or incidence.          Pain      Current pain rating: 10              Location: L knee, lateral, inferior      Quality: tightness and aching      Relieving factors: rest, elevation and medications      Aggravating factors: sitting, on the move, stairs and other (driving, lifting the leg medially)      Pain Related Behaviors: irritable    Progression: no change      Red flags: none            Current functional status: exercise, limited standing tolerance, limited walking tolerance, limited recreation, leisure activities, difficulty with transfers, disturbed sleep and limited sitting tolerance      Precautions and Equipment  Precautions: None  Current Braces/Orthoses: None  Equipment Currently Used: None    Social Support  Communication Preference: verbal, written and visual   Barriers to Learning: No Barriers  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[employer]    Diagnostic Tests  No diagnostic tests performed                                  Treatments      Previous treatment: medication and other (Tried exercise/walking)                      Patient Goals  Patient goals for therapy: decreased pain, decreased edema, return to recreational activites, return to sport/leisure activities, increased strength, improved standing tolerance, improved ambulation and improved sleep      Patient goal: Patient would like to be able to get back to running; And would like to be able to get on and off the floor without holding onto anything          Objective    Previous session:  Knee ROM  Motion L  AROM  L  PROM R  AROM R  PROM Symptom/  End Feel Movement Analysis   Flexion Not measured 2/2 pain  WNL      Extension Full ext  WNL           Strength (MMT) & Handheld Dynamometer:  LE MMT Left   (/5) Right  (/5) HHD  (lbs) Symptoms Movement Analysis   Hip Flex:  (L2) 4+ 4+      Hip Ext:        Hip ABD:        Hip ADD: 5 5      Hip IR: 4 4+  P! Lateral L knee    Hip ER: 4 4+  P! Lateral L knee    Knee Ext:  (L3) 4+ 5      Knee Flex: (S2) 2+ 5  P! L knee, lateral and up thigh Minimal L HS engagement   Ankle DF:  (L4) 5 5      Ankle PF:  (S1) 5 5      Ankle Inv:        Ankle Ev:        Great Toe Ext: (L5)          Gait Analysis:  Reduced antalgic quality, increased LLE stance time, no cane, no brace      Treatment Rendered:      Therapeutic Exercise: 27 Minutes  SL hip abduction x10 reps with manual resistance  Standing Calf raises x20 reps PRN UE support, band for lateral hip, purple (added to HEP)  Cisco, purple band 2x45'  Lateral Band Walks, purple band, 2x45'  TRX Mini-squat with theraband at knees, purple TB, 2x10 reps (added to HEP)  Rebounder forward tosses, PRN back foot, yellow TB, x20 reps    PRN rest breaks    Manual Therapy: 12 Minutes  Passive mobilization of the L knee into physiologic flex/ext  LA Distraction grade III, LLE  Theragun to the LLE vastus lateralis and ITB for pain relief, and reduced influence of hypertonicity  Grade I-II greater trochanter mobs  Lateral thigh TP release    Total Treatment Time: 39 minutes      Patient Education:   Throughout evaluation patient educated regarding the following: Role of PT in Rehabilitation, importance of therapy, body mechanics, treatment plan and Indications/Contraindications to Exercises. Patient demonstrated and verbalized agreement and understanding.  See treatment rendered section above for additional educational topics reviewed with patient, family and/or caregiver.      Equipment provided/recommended:   HEP reviewed, emailed to patient    I attest that I have reviewed the above information.  Signed: Fredrik Cove, PT  01/14/2019 8:28 AM

## 2019-01-14 DIAGNOSIS — R29898 Other symptoms and signs involving the musculoskeletal system: Secondary | ICD-10-CM

## 2019-01-14 DIAGNOSIS — M25562 Pain in left knee: Secondary | ICD-10-CM

## 2019-01-14 DIAGNOSIS — R269 Unspecified abnormalities of gait and mobility: Secondary | ICD-10-CM

## 2019-01-14 DIAGNOSIS — S83242D Other tear of medial meniscus, current injury, left knee, subsequent encounter: Principal | ICD-10-CM

## 2019-01-15 NOTE — Unmapped (Signed)
[name] requested medication delivered to home address not work at this time.    [facility] Clinical Assessment & Refill Coordination Note    Amber Carson, DOB: 10/11/1975  Phone: [phone] (home) [phone] (work)    All above HIPAA information was verified with patient.     Specialty Medication(s):   Neurology: Emgality     Current Outpatient Medications   Medication Sig Dispense Refill   ??? CAMILA 0.35 mg tablet      ??? cetirizine HCl (ZYRTEC ORAL) Take by mouth.     ??? cyclobenzaprine (FLEXERIL) 5 MG tablet Take 1 tablet (5 mg total) by mouth Three (3) times a day. 60 tablet 1   ??? empty container Misc Use as directed 1 each PRN   ??? fluticasone (FLONASE) 50 mcg/actuation nasal spray 1 spray by Each Nare route daily.     ??? gabapentin (NEURONTIN) 300 MG capsule Take 1 capsule (300 mg total) by mouth Three (3) times a day. 90 capsule 1   ??? galcanezumab-gnlm (EMGALITY) 120 mg/mL injection Inject 1 ml (120 mg) under the skin every thirty (30) days. 1 mL 11   ??? HYDROcodone-acetaminophen (NORCO) 5-325 mg per tablet Take 1 tablet by mouth daily as needed for pain. 30 tablet 0   ??? hydrocortisone (ANUSOL-HC) 25 mg suppository Unwrap and Insert 1 suppository (25 mg total) into the rectum two (2) times a day as needed for hemorrhoids. 20 suppository 2   ??? LORazepam (ATIVAN) 1 MG tablet Take 1 tablet (1 mg total) by mouth daily as needed for anxiety. 30 tablet 1   ??? magnesium 200 mg Tab Take 1 capsule by mouth.     ??? multivitamin (TAB-A-VITE/THERAGRAN) per tablet Take 1 tablet by mouth daily.     ??? pseudoephedrine HCl (SUDAFED ORAL) Take by mouth.     ??? QUEtiapine (SEROQUEL) 25 MG tablet Take 1 to 2 tablets (25-50 MG) daily as needed for severe migraine for sleep. DO NOT DRIVE or operate machinery/childcare when taking this medication. 30 tablet 0   ??? ranitidine (ZANTAC) 150 MG capsule Take 150 mg by mouth daily as needed for heartburn.     ??? rizatriptan (MAXALT-MLT) 10 MG disintegrating tablet Take 1 tablet by mouth.  May repeat in 2 hours if needed for migraine. NO more than 2 tablets in 24 hours. 12 tablet 11   ??? topiramate (TOPAMAX) 25 MG tablet Take 2 tablets (50 mg total) by mouth nightly. 60 tablet 2   ??? venlafaxine (EFFEXOR XR) 75 MG 24 hr capsule Take 1 capsule (75 mg total) by mouth daily. 90 capsule 3   ??? zolpidem (AMBIEN) 5 MG tablet Take 1/2 tablet (2.5 mg total) by mouth nightly as needed for sleep. 30 tablet 0     Current Facility-Administered Medications   Medication Dose Route Frequency Provider Last Rate Last Dose   ??? triamcinolone acetonide (KENALOG-40) injection 80 mg  80 mg Intra-articular Once Cassell Smiles, [place]            Changes to medications: Maeven reports no changes at this time.    Allergies   Allergen Reactions   ??? Nsaids (Non-Steroidal Anti-Inflammatory Drug) Other (See Comments)       Changes to allergies: No    SPECIALTY MEDICATION ADHERENCE     Emgality 120 mg/ml: 0 days of medicine on hand     Medication Adherence    Adherence tools used: calendar  Specialty medication(s) dose(s) confirmed: Regimen is correct and unchanged.     Are there any concerns with adherence? No    Adherence counseling provided? Not needed    CLINICAL MANAGEMENT AND INTERVENTION      Clinical Benefit Assessment:    Do you feel the medicine is effective or helping your condition? Yes    Clinical Benefit counseling provided? Progress note from 05/11/18 shows evidence of clinical benefit    Adverse Effects Assessment:    Are you experiencing any side effects? No    Are you experiencing difficulty administering your medicine? No    Quality of Life Assessment:    How many days over the past month did your migraines  keep you from your normal activities? For example, brushing your teeth or getting up in the morning. Patient declined to answer    Have you discussed this with your provider? Not needed    Therapy Appropriateness:    Is therapy appropriate? Yes, therapy is appropriate and should be continued    DISEASE/MEDICATION-SPECIFIC INFORMATION      For patients on injectable medications: Patient currently has 0 doses left.  Next injection is scheduled for 01/26/19.    PATIENT SPECIFIC NEEDS     ? Does the patient have any physical, cognitive, or cultural barriers? No    ? Is the patient high risk? No     ? Does the patient require a Care Management Plan? No     ? Does the patient require physician intervention or other additional services (i.e. nutrition, smoking cessation, social work)? No      SHIPPING     Specialty Medication(s) to be Shipped:   Neurology: Emgality    Other medication(s) to be shipped: none     Changes to insurance: No    Delivery Scheduled: Yes, Expected medication delivery date: 01/22/19.     Medication will be delivered via UPS to the confirmed home address in [facility].    The patient will receive a drug information handout for each medication shipped and additional FDA Medication Guides as required.  Verified that patient has previously received a Conservation officer, historic buildings.    All of the patient's questions and concerns have been addressed.    [name] [facility] Specialty Pharmacist

## 2019-01-21 MED FILL — EMGALITY PEN 120 MG/ML SUBCUTANEOUS PEN INJECTOR: 30 days supply | Qty: 1 | Fill #10 | Status: AC

## 2019-01-21 MED FILL — EMGALITY PEN 120 MG/ML SUBCUTANEOUS PEN INJECTOR: SUBCUTANEOUS | 30 days supply | Qty: 1 | Fill #10

## 2019-01-24 MED ORDER — ZOLPIDEM 5 MG TABLET
ORAL_TABLET | Freq: Every evening | ORAL | 0 refills | 60 days | Status: CP | PRN
Start: 2019-01-24 — End: 2019-02-23

## 2019-01-24 MED ORDER — HYDROCODONE 5 MG-ACETAMINOPHEN 325 MG TABLET
ORAL_TABLET | Freq: Every day | ORAL | 0 refills | 30 days | Status: CP | PRN
Start: 2019-01-24 — End: ?

## 2019-01-24 NOTE — Unmapped (Signed)
[facility] [place]  OUTPATIENT PHYSICAL THERAPY  01/25/2019  Note Type: Re-evaluation/Progress Note  Progress Note Date Range: 12/19/18 - 01/25/19    Patient Name: Amber Carson  Date of Birth:[birth year]  Visit #: 5 (5/10 Progress Note)  Diagnosis:   Encounter Diagnoses   Name Primary?   ??? Acute medial meniscus tear of left knee, subsequent encounter Yes   ??? Acute pain of left knee    ??? Gait abnormality    ??? Impaired strength of lower extremity    ??? Chronic pain of left knee      Referring MD:  Erenest Rasher*   Initial Evaluation Date: 12/19/18    Plan of Care Effective Date:       Assessment & Plan     Assessment details: Patient presented with improving overall symptoms, specifically noted after the recent application of a new topical L leg cream. Pt demonstrating improved BLE strength, improved AROM, however still with pain symptoms consistent with intra-articular pathology, specifically to the postero-lateral aspect of the left knee, however with negative imaging. Given that patient is improving since the onset of PT, and still demonstrates deficits to the LLE, PT feels it is appropriate to extend the patient PT POC x4 additional weeks. Pt has met two of her short term goals and is in progress with all of her long term goals at this time. PT POC adjusted below to refect these changes.      Impairments: decreased mobility, decreased strength, gait deviation, impaired bed mobility, impaired transfers, pain, decreased endurance, poor awareness of body mechanics and poor sensory processing        Personal Factors/Comorbidities: 2  Specific Comorbidities: Increased body habitus; L knee pains    Body System: Musculoskeletal   Clinical Presentation: stable  Clinical Decision Making: low    Prognosis: fair      Positive Prognosis Rationale: chronicity of condition and strength.   Negative Prognosis Rationale: Pain Status and body habitus.        Therapy Goals  Goals: Short Term Goals:  In 3 weeks:  1) Pt will be ind with HEP and able to demo x1 in order to make progressive gains in function between PT visits. Met  2) Pt will have involved knee AROM & PROM that is WNL and comparable to the contralateral LE in order to return to LE dominant ADLs with reduced pain and dsyfunction. Met  3) Pt will have no tenderness to palpation at the posterior lateral left HS muscle tendon unit demonstrating lower irritability level of involved structures. Not Met    Long Term Goals:  In 12 weeks:  1) Pt will increase FOTO score to >/= to the predicted level demonstrating a significant improvement in function since beginning PT. Discontinued  2) Pt will have 5/5 strength in the involved LE in all deficient planes of motion in order to return to LE dominant ADLs and recreation without pain. In Progress  3) Pt will be able to perform a bilat squat 2 x10 with good biomechanics and without pain in order to return to LE dominant ADLs and recreation without pain. In Progress  4) Pt will be able to return to desired recreation, including starting a walk/run program, without pain or dysfunction. In Progress      Plan  Therapy options: will be seen for skilled physical therapy services    Planned therapy interventions: Aquatic Therapy, body mechanics training, compression bandaging, Cryotherapy, Diaphragmatic/Pursed-lip breathing, education - patient,  endurance activites, E-stim, functional mobility, gait training, home exercise program, Ultrasound, transfer training, traction, therapeutic exercises, therapeutic activities, TENs, taping, self-care / home training, postural training, neuromuscular re-education and manual therapy      PT/DME Equipment: walker (rolling), compression garments and crutches (Recommended either AD).  Frequency: 1x week    Duration in weeks: 4 additional weeks    Education provided to: patient.    Education Results: needs reinforcement and verbalized good understanding.    Next visit plan: Reassess next session              Subjective     History of Present Illness  Date of Onset: 10/18/2018    Date of Evaluation: 12/19/2018    Reason for Referral/Chief Complaint: Patient presents to OPPT with c/o left knee pains for ~2 months. Pt reports that two months ago she was coming off of a two step step and twisted, with pain the next day to the left knee. Pt denies previous left knee pain. Pt reports mostly postero-lateral upper L thigh tightness, that can go below the knee. Pt reports with her leg dangling her left knee she has posterior knee pain. When she walks a lot it can click and pop, but it has not done that since. Pt reports going up stairs is worse than coming down. Pt reports if she sits criss cross or too long she takes some time to get. Pt reports she has 6 lumbar vertebrae and and a hip malalignment that she can feel from time to time with walking.      Subjective: Patient reports she is using horse cream on her leg and that has made her feel a lot better. Since starting to use this cream she has been able walk better and tolerate more activity.          Pain      Current pain rating: 4              Location: L knee, lateral, inferior      Quality: tightness and aching      Relieving factors: rest, elevation and medications      Aggravating factors: sitting, on the move, stairs and other (driving, lifting the leg medially)      Pain Related Behaviors: irritable    Progression: no change      Red flags: none            Current functional status: exercise, limited standing tolerance, limited walking tolerance, limited recreation, leisure activities, difficulty with transfers, disturbed sleep and limited sitting tolerance      Precautions and Equipment  Precautions: None  Current Braces/Orthoses: None  Equipment Currently Used: None    Social Support  Communication Preference: verbal, written and visual   Barriers to Learning: No Barriers  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[employer]    Diagnostic Tests No diagnostic tests performed                                  Treatments      Previous treatment: medication and other (Tried exercise/walking)                      Patient Goals  Patient goals for therapy: decreased pain, decreased edema, return to recreational activites, return to sport/leisure activities, increased strength, improved standing tolerance, improved ambulation and improved sleep      Patient goal: Patient would like  to be able to get back to running; And would like to be able to get on and off the floor without holding onto anything          Objective    Appley's: + with compression and distraction, posterior lateral L knee intra-articular pain  Ligament Testing LLE: -    Knee ROM  Motion L  AROM L  PROM R  AROM R  PROM Symptom/  End Feel Movement Analysis   Flexion 141  141      Extension +2  +1           Strength (MMT) & Handheld Dynamometer:  LE MMT Left   (/5) Right  (/5) HHD  (lbs) Symptoms Movement Analysis   Hip Flex:  (L2) 4+ 4+      Hip Ext:        Hip ABD:        Hip ADD: 5 5      Hip IR: 5 5  P! Lateral L knee    Hip ER: 4+ 4+  P! Lateral L knee    Knee Ext:  (L3) 5 5      Knee Flex: (S2) 4 5  P! L knee, lateral and up thigh Minimal L HS engagement   Ankle DF:  (L4) 5 5      Ankle PF:  (S1) 5 5      Ankle Inv:        Ankle Ev:        Great Toe Ext: (L5)          Gait Analysis:  Patient ambulating w/o a limp, or noted gait abnormality, notes pain in the L hip, mild pain in the L knee      Treatment Rendered:      Therapeutic Exercise: 35 Minutes  Reassessment of LE strength, knee ROM, and special testing to determine progress with therex  Supine Bridging, 25 lb loading, 2x10 reps (added to HEP)  Cisco, purple band 2x45'  Lateral Band Walks, purple band, 2x45'  TRX Mini-squat with theraband at knees, purple TB, x15 reps (added to HEP)    PRN rest breaks    Not Performed  Manual Therapy: Minutes  Passive mobilization of the L knee into physiologic flex/ext  LA Distraction grade III, LLE  Theragun to the LLE vastus lateralis and ITB for pain relief, and reduced influence of hypertonicity  Grade I-II greater trochanter mobs  Lateral thigh TP release    Total Treatment Time: 35 minutes      Patient Education:   Throughout evaluation patient educated regarding the following: Role of PT in Rehabilitation, importance of therapy, body mechanics, treatment plan and Indications/Contraindications to Exercises. Patient demonstrated and verbalized agreement and understanding.  See treatment rendered section above for additional educational topics reviewed with patient, family and/or caregiver.      Equipment provided/recommended:   HEP reviewed    I attest that I have reviewed the above information.  Signed: Fredrik Cove, PT  01/25/2019 3:59 PM

## 2019-01-25 ENCOUNTER — Encounter
Admit: 2019-01-25 | Discharge: 2019-02-16 | Payer: BLUE CROSS/BLUE SHIELD | Attending: Rehabilitative and Restorative Service Providers" | Primary: Rehabilitative and Restorative Service Providers"

## 2019-01-25 ENCOUNTER — Ambulatory Visit
Admit: 2019-01-25 | Discharge: 2019-02-16 | Payer: BLUE CROSS/BLUE SHIELD | Attending: Rehabilitative and Restorative Service Providers" | Primary: Rehabilitative and Restorative Service Providers"

## 2019-01-25 DIAGNOSIS — R29898 Other symptoms and signs involving the musculoskeletal system: Secondary | ICD-10-CM

## 2019-01-25 DIAGNOSIS — R269 Unspecified abnormalities of gait and mobility: Secondary | ICD-10-CM

## 2019-01-25 DIAGNOSIS — M25562 Pain in left knee: Secondary | ICD-10-CM

## 2019-01-25 DIAGNOSIS — G8929 Other chronic pain: Secondary | ICD-10-CM

## 2019-01-25 DIAGNOSIS — S83242D Other tear of medial meniscus, current injury, left knee, subsequent encounter: Principal | ICD-10-CM

## 2019-01-25 MED FILL — VENLAFAXINE ER 75 MG CAPSULE,EXTENDED RELEASE 24 HR: ORAL | 90 days supply | Qty: 90 | Fill #1

## 2019-01-25 MED FILL — VENLAFAXINE ER 75 MG CAPSULE,EXTENDED RELEASE 24 HR: 90 days supply | Qty: 90 | Fill #1 | Status: AC

## 2019-01-28 NOTE — Unmapped (Signed)
[facility] [place]  OUTPATIENT PHYSICAL THERAPY  01/30/2019          Patient Name: Amber Carson  Date of Birth:11-Sep-1975  Visit #: 6 (6/10 Progress Note)  Diagnosis:   Encounter Diagnoses   Name Primary?   ??? Acute medial meniscus tear of left knee, subsequent encounter Yes   ??? Acute pain of left knee    ??? Gait abnormality    ??? Impaired strength of lower extremity    ??? Chronic pain of left knee      Referring MD:  Erenest Rasher*   Initial Evaluation Date: 12/19/18    Plan of Care Effective Date:       Assessment & Plan     Assessment details: Patient still presenting with LLE knee pains, but improved since the start of PT. PT focsued today's session on reintroduction of LE strengthening activities, including loaded CKC therex with patient tolerance and without exacerbation of LLE knee pain symptoms. PT to continue to progressively overload patient's LEs to tolerance to improve her overall functional capacity. Continue with skilled PT POC to address remaining patient functional deficits, meet pt goals with PT POC, and optimize patient level of function on discharge.      Impairments: decreased mobility, decreased strength, gait deviation, impaired bed mobility, impaired transfers, pain, decreased endurance, poor awareness of body mechanics and poor sensory processing        Personal Factors/Comorbidities: 2  Specific Comorbidities: Increased body habitus; L knee pains    Body System: Musculoskeletal   Clinical Presentation: stable  Clinical Decision Making: low    Prognosis: fair      Positive Prognosis Rationale: chronicity of condition and strength.   Negative Prognosis Rationale: Pain Status and body habitus.        Therapy Goals  Goals: Short Term Goals:  In 3 weeks:  1) Pt will be ind with HEP and able to demo x1 in order to make progressive gains in function between PT visits. Met  2) Pt will have involved knee AROM & PROM that is WNL and comparable to the contralateral LE in order to return to LE dominant ADLs with reduced pain and dsyfunction. Met  3) Pt will have no tenderness to palpation at the posterior lateral left HS muscle tendon unit demonstrating lower irritability level of involved structures. Not Met    Long Term Goals:  In 12 weeks:  1) Pt will increase FOTO score to >/= to the predicted level demonstrating a significant improvement in function since beginning PT. Discontinued  2) Pt will have 5/5 strength in the involved LE in all deficient planes of motion in order to return to LE dominant ADLs and recreation without pain. In Progress  3) Pt will be able to perform a bilat squat 2 x10 with good biomechanics and without pain in order to return to LE dominant ADLs and recreation without pain. In Progress  4) Pt will be able to return to desired recreation, including starting a walk/run program, without pain or dysfunction. In Progress      Plan  Therapy options: will be seen for skilled physical therapy services    Planned therapy interventions: Aquatic Therapy, body mechanics training, compression bandaging, Cryotherapy, Diaphragmatic/Pursed-lip breathing, education - patient, endurance activites, E-stim, functional mobility, gait training, home exercise program, Ultrasound, transfer training, traction, therapeutic exercises, therapeutic activities, TENs, taping, self-care / home training, postural training, neuromuscular re-education and manual therapy      PT/DME  Equipment: walker (rolling), compression garments and crutches (Recommended either AD).  Frequency: 1x week    Duration in weeks: 4 additional weeks    Education provided to: patient.    Education Results: needs reinforcement and verbalized good understanding.    Next visit plan: Reassess next session              Subjective     History of Present Illness  Date of Onset: 10/18/2018    Date of Evaluation: 12/19/2018    Reason for Referral/Chief Complaint: Patient presents to OPPT with c/o left knee pains for ~2 months. Pt reports that two months ago she was coming off of a two step step and twisted, with pain the next day to the left knee. Pt denies previous left knee pain. Pt reports mostly postero-lateral upper L thigh tightness, that can go below the knee. Pt reports with her leg dangling her left knee she has posterior knee pain. When she walks a lot it can click and pop, but it has not done that since. Pt reports going up stairs is worse than coming down. Pt reports if she sits criss cross or too long she takes some time to get. Pt reports she has 6 lumbar vertebrae and and a hip malalignment that she can feel from time to time with walking.      Subjective: Patient reports that she had a massage on Saturday that felt good. Pt did not use her cream all week and is now having more pains to the lateral posterior L knee. Pt reports she had her knee crunch reaching out to get her child this past weekend.          Pain      Current pain rating: 5              Location: L knee, lateral, inferior      Quality: tightness and aching      Relieving factors: rest, elevation and medications      Aggravating factors: sitting, on the move, stairs and other (driving, lifting the leg medially)      Pain Related Behaviors: irritable    Progression: no change      Red flags: none            Current functional status: exercise, limited standing tolerance, limited walking tolerance, limited recreation, leisure activities, difficulty with transfers, disturbed sleep and limited sitting tolerance      Precautions and Equipment  Precautions: None  Current Braces/Orthoses: None  Equipment Currently Used: None    Social Support  Communication Preference: verbal, written and visual   Barriers to Learning: No Barriers  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[employer]    Diagnostic Tests  No diagnostic tests performed                                  Treatments      Previous treatment: medication and other (Tried exercise/walking) Patient Goals  Patient goals for therapy: decreased pain, decreased edema, return to recreational activites, return to sport/leisure activities, increased strength, improved standing tolerance, improved ambulation and improved sleep      Patient goal: Patient would like to be able to get back to running; And would like to be able to get on and off the floor without holding onto anything          Objective    Previous Session 01/25/19:  Appley's: +  with compression and distraction, posterior lateral L knee intra-articular pain  Ligament Testing LLE: -    Knee ROM  Motion L  AROM L  PROM R  AROM R  PROM Symptom/  End Feel Movement Analysis   Flexion 141  141      Extension +2  +1           Strength (MMT) & Handheld Dynamometer:  LE MMT Left   (/5) Right  (/5) HHD  (lbs) Symptoms Movement Analysis   Hip Flex:  (L2) 4+ 4+      Hip Ext:        Hip ABD:        Hip ADD: 5 5      Hip IR: 5 5  P! Lateral L knee    Hip ER: 4+ 4+  P! Lateral L knee    Knee Ext:  (L3) 5 5      Knee Flex: (S2) 4 5  P! L knee, lateral and up thigh Minimal L HS engagement   Ankle DF:  (L4) 5 5      Ankle PF:  (S1) 5 5      Ankle Inv:        Ankle Ev:        Great Toe Ext: (L5)          Gait Analysis:  Patient ambulating w/o a limp, or noted gait abnormality, notes pain in the L hip, mild pain in the L knee      Treatment Rendered:      Therapeutic Exercise: 31 Minutes    Supine Bridging, 30 lb loading, 15 reps (added to HEP)  Prone HS Curls with manual resistance 2x6 reps  TKEs blue TB x40 reps (added to HEP)  Wall Ball Squats red TBall, x10 reps  DB DL 16XW DB 2x8 reps (added to HEP)    PRN rest breaks    Manual Therapy: 8 Minutes  Lateral posterior L knee TP    Total Treatment Time: 39 minutes      Patient Education:   Throughout evaluation patient educated regarding the following: Role of PT in Rehabilitation, importance of therapy, body mechanics, treatment plan and Indications/Contraindications to Exercises. Patient demonstrated and verbalized agreement and understanding.  See treatment rendered section above for additional educational topics reviewed with patient, family and/or caregiver.      Equipment provided/recommended:   HEP reviewed and emailed to patient    I attest that I have reviewed the above information.  Signed: Fredrik Cove, PT  01/30/2019 9:19 AM

## 2019-01-30 DIAGNOSIS — G8929 Other chronic pain: Secondary | ICD-10-CM

## 2019-01-30 DIAGNOSIS — R29898 Other symptoms and signs involving the musculoskeletal system: Secondary | ICD-10-CM

## 2019-01-30 DIAGNOSIS — S83242D Other tear of medial meniscus, current injury, left knee, subsequent encounter: Principal | ICD-10-CM

## 2019-01-30 DIAGNOSIS — M25562 Pain in left knee: Secondary | ICD-10-CM

## 2019-01-30 DIAGNOSIS — R269 Unspecified abnormalities of gait and mobility: Secondary | ICD-10-CM

## 2019-02-04 NOTE — Unmapped (Deleted)
[facility] THERAPY SERVICES [facility] [place]  OUTPATIENT PHYSICAL THERAPY  02/08/2019          Patient Name: Amber Carson  Date of Birth:05/04/1976  Visit #: 7 (7/10 Progress Note)  Diagnosis:   Encounter Diagnoses   Name Primary?   ??? Acute medial meniscus tear of left knee, subsequent encounter Yes   ??? Acute pain of left knee    ??? Gait abnormality    ??? Impaired strength of lower extremity    ??? Chronic pain of left knee      Referring MD:  Erenest Rasher*   Initial Evaluation Date: 12/19/18    Plan of Care Effective Date:       Assessment & Plan     Assessment details: ***    Patient still presenting with LLE knee pains, but improved since the start of PT. PT focsued today's session on reintroduction of LE strengthening activities, including loaded CKC therex with patient tolerance and without exacerbation of LLE knee pain symptoms. PT to continue to progressively overload patient's LEs to tolerance to improve her overall functional capacity. Continue with skilled PT POC to address remaining patient functional deficits, meet pt goals with PT POC, and optimize patient level of function on discharge.      Impairments: decreased mobility, decreased strength, gait deviation, impaired bed mobility, impaired transfers, pain, decreased endurance, poor awareness of body mechanics and poor sensory processing        Personal Factors/Comorbidities: 2  Specific Comorbidities: Increased body habitus; L knee pains    Body System: Musculoskeletal   Clinical Presentation: stable  Clinical Decision Making: low    Prognosis: fair      Positive Prognosis Rationale: chronicity of condition and strength.   Negative Prognosis Rationale: Pain Status and body habitus.Insight:            Therapy Goals  Goals: Short Term Goals:  In 3 weeks:  1) Pt will be ind with HEP and able to demo x1 in order to make progressive gains in function between PT visits. Met  2) Pt will have involved knee AROM & PROM that is WNL and comparable to the contralateral LE in order to return to LE dominant ADLs with reduced pain and dsyfunction. Met  3) Pt will have no tenderness to palpation at the posterior lateral left HS muscle tendon unit demonstrating lower irritability level of involved structures. Not Met    Long Term Goals:  In 12 weeks:  1) Pt will increase FOTO score to >/= to the predicted level demonstrating a significant improvement in function since beginning PT. Discontinued  2) Pt will have 5/5 strength in the involved LE in all deficient planes of motion in order to return to LE dominant ADLs and recreation without pain. In Progress  3) Pt will be able to perform a bilat squat 2 x10 with good biomechanics and without pain in order to return to LE dominant ADLs and recreation without pain. In Progress  4) Pt will be able to return to desired recreation, including starting a walk/run program, without pain or dysfunction. In Progress      Plan  Therapy options: will be seen for skilled physical therapy services    Planned therapy interventions: Aquatic Therapy, body mechanics training, compression bandaging, Cryotherapy, Diaphragmatic/Pursed-lip breathing, education - patient, endurance activites, E-stim, functional mobility, gait training, home exercise program, Ultrasound, transfer training, traction, therapeutic exercises, therapeutic activities, TENs, taping, self-care / home training, postural training, neuromuscular re-education and  manual therapy      PT/DME Equipment: walker (rolling), compression garments and crutches (Recommended either AD).  Frequency: 1x week    Duration in weeks: 4 additional weeks    Education provided to: patient.    Education Results: needs reinforcement and verbalized good understanding.    Next visit plan: Reassess next session      Plan details: Additional Week: 2/4          Subjective     History of Present Illness  Date of Onset: 10/18/2018    Date of Evaluation: 12/19/2018    Reason for Referral/Chief Complaint: Patient presents to OPPT with c/o left knee pains for ~2 months. Pt reports that two months ago she was coming off of a two step step and twisted, with pain the next day to the left knee. Pt denies previous left knee pain. Pt reports mostly postero-lateral upper L thigh tightness, that can go below the knee. Pt reports with her leg dangling her left knee she has posterior knee pain. When she walks a lot it can click and pop, but it has not done that since. Pt reports going up stairs is worse than coming down. Pt reports if she sits criss cross or too long she takes some time to get. Pt reports she has 6 lumbar vertebrae and and a hip malalignment that she can feel from time to time with walking.      Subjective: ***    Patient reports that she had a massage on Saturday that felt good. Pt did not use her cream all week and is now having more pains to the lateral posterior L knee. Pt reports she had her knee crunch reaching out to get her child this past weekend.          Pain      Current pain rating: 5              Location: L knee, lateral, inferior      Quality: tightness and aching      Relieving factors: rest, elevation and medications      Aggravating factors: sitting, on the move, stairs and other (driving, lifting the leg medially)      Pain Related Behaviors: irritable    Progression: no change      Red flags: none            Current functional status: exercise, limited standing tolerance, limited walking tolerance, limited recreation, leisure activities, difficulty with transfers, disturbed sleep and limited sitting tolerance      Precautions and Equipment  Precautions: None  Current Braces/Orthoses: None  Equipment Currently Used: None    Social Support  Communication Preference: verbal, written and visual   Barriers to Learning: No Barriers  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[employer]    Diagnostic Tests  No diagnostic tests performed                                  Treatments      Previous treatment: medication and other (Tried exercise/walking)                      Patient Goals  Patient goals for therapy: decreased pain, decreased edema, return to recreational activites, return to sport/leisure activities, increased strength, improved standing tolerance, improved ambulation and improved sleep      Patient goal: Patient would like to be able to get  back to running; And would like to be able to get on and off the floor without holding onto anything          Objective    Previous Session 01/25/19:  Appley's: + with compression and distraction, posterior lateral L knee intra-articular pain  Ligament Testing LLE: -    Knee ROM  Motion L  AROM L  PROM R  AROM R  PROM Symptom/  End Feel Movement Analysis   Flexion 141  141      Extension +2  +1           Strength (MMT) & Handheld Dynamometer:  LE MMT Left   (/5) Right  (/5) HHD  (lbs) Symptoms Movement Analysis   Hip Flex:  (L2) 4+ 4+      Hip Ext:        Hip ABD:        Hip ADD: 5 5      Hip IR: 5 5  P! Lateral L knee    Hip ER: 4+ 4+  P! Lateral L knee    Knee Ext:  (L3) 5 5      Knee Flex: (S2) 4 5  P! L knee, lateral and up thigh Minimal L HS engagement   Ankle DF:  (L4) 5 5      Ankle PF:  (S1) 5 5      Ankle Inv:        Ankle Ev:        Great Toe Ext: (L5)          Gait Analysis:  Patient ambulating w/o a limp, or noted gait abnormality, notes pain in the L hip, mild pain in the L knee      Treatment Rendered:      Therapeutic Exercise: *** Minutes    Supine Bridging, 30 lb loading, 15 reps (added to HEP)  Prone HS Curls with manual resistance 2x6 reps  TKEs blue TB x40 reps (added to HEP)  Wall Ball Squats red TBall, x10 reps  DB DL 16XW DB 2x8 reps (added to HEP)      Monster Walks ***  Lateral Band Walks***  Static stability with foam/rebounder/BOSU***    PRN rest breaks    Manual Therapy: *** Minutes  Lateral posterior L knee TP    Total Treatment Time: *** minutes      Patient Education:   Throughout evaluation patient educated regarding the following: Role of PT in Rehabilitation, importance of therapy, body mechanics, treatment plan and Indications/Contraindications to Exercises. Patient demonstrated and verbalized agreement and understanding.  See treatment rendered section above for additional educational topics reviewed with patient, family and/or caregiver.      Equipment provided/recommended:   HEP reviewed and emailed to patient    I attest that I have reviewed the above information.  Signed: Fredrik Cove, PT  02/08/2019 10:19 AM

## 2019-02-07 NOTE — Unmapped (Signed)
This patient has been disenrolled from the [facility] specialty pharmacy services due to [facility] has been removed from specialty list.    Amber Carson  [facility] Specialty Pharmacist

## 2019-02-07 NOTE — Unmapped (Signed)
[facility] Pharmacy Refill Coordination Note    Specialty Medication(s) to be Shipped:   General Specialty: emgality 120mg /ml     Other medication(s) to be shipped:       Amber Carson, DOB: 11-30-1975  Phone: [phone] (work)      All above HIPAA information was verified with patient.     Completed refill call assessment today to schedule patient's medication shipment from the [facility] [phone]).       Specialty medication(s) and dose(s) confirmed: Regimen is correct and unchanged.   Changes to medications: Leasia reports no changes at this time.  Changes to insurance: No  Questions for the pharmacist: No    Confirmed patient received Welcome Packet with first shipment. The patient will receive a drug information handout for each medication shipped and additional FDA Medication Guides as required.       DISEASE/MEDICATION-SPECIFIC INFORMATION        N/A    SPECIALTY MEDICATION ADHERENCE     Medication Adherence    Patient reported X missed doses in the last month: 0  Specialty Medication: emgality 120mg /ml  Patient is on additional specialty medications: No  Adherence tools used: calendar                Emgality 120 mg: 0 days of medicine on hand       SHIPPING     Shipping address confirmed in Epic.     Delivery Scheduled: Yes, Expected medication delivery date: 09/15.     Medication will be delivered via UPS to the home address in Epic WAM.    Amber Carson   [facility] Specialty Technician

## 2019-02-15 ENCOUNTER — Ambulatory Visit
Admit: 2019-02-15 | Discharge: 2019-02-16 | Payer: BLUE CROSS/BLUE SHIELD | Attending: Student in an Organized Health Care Education/Training Program | Primary: Student in an Organized Health Care Education/Training Program

## 2019-02-15 DIAGNOSIS — R269 Unspecified abnormalities of gait and mobility: Secondary | ICD-10-CM

## 2019-02-15 DIAGNOSIS — S83242D Other tear of medial meniscus, current injury, left knee, subsequent encounter: Secondary | ICD-10-CM

## 2019-02-15 DIAGNOSIS — R109 Unspecified abdominal pain: Principal | ICD-10-CM

## 2019-02-15 DIAGNOSIS — M25562 Pain in left knee: Secondary | ICD-10-CM

## 2019-02-15 DIAGNOSIS — Z87442 Personal history of urinary calculi: Secondary | ICD-10-CM

## 2019-02-15 DIAGNOSIS — G8929 Other chronic pain: Secondary | ICD-10-CM

## 2019-02-15 DIAGNOSIS — R29898 Other symptoms and signs involving the musculoskeletal system: Secondary | ICD-10-CM

## 2019-02-15 MED ORDER — TAMSULOSIN 0.4 MG CAPSULE
ORAL_CAPSULE | Freq: Every day | ORAL | 3 refills | 90.00000 days | Status: CP
Start: 2019-02-15 — End: 2020-02-15

## 2019-02-19 DIAGNOSIS — G43709 Chronic migraine without aura, not intractable, without status migrainosus: Secondary | ICD-10-CM

## 2019-02-20 ENCOUNTER — Encounter
Admit: 2019-02-20 | Discharge: 2019-03-18 | Payer: BLUE CROSS/BLUE SHIELD | Attending: Rehabilitative and Restorative Service Providers" | Primary: Rehabilitative and Restorative Service Providers"

## 2019-02-20 ENCOUNTER — Ambulatory Visit
Admit: 2019-02-20 | Discharge: 2019-03-18 | Payer: BLUE CROSS/BLUE SHIELD | Attending: Rehabilitative and Restorative Service Providers" | Primary: Rehabilitative and Restorative Service Providers"

## 2019-02-20 DIAGNOSIS — M25562 Pain in left knee: Secondary | ICD-10-CM

## 2019-02-20 DIAGNOSIS — R269 Unspecified abnormalities of gait and mobility: Secondary | ICD-10-CM

## 2019-02-20 DIAGNOSIS — R29898 Other symptoms and signs involving the musculoskeletal system: Secondary | ICD-10-CM

## 2019-02-20 DIAGNOSIS — G8929 Other chronic pain: Secondary | ICD-10-CM

## 2019-02-20 DIAGNOSIS — S83242D Other tear of medial meniscus, current injury, left knee, subsequent encounter: Secondary | ICD-10-CM

## 2019-02-20 MED FILL — EMGALITY PEN 120 MG/ML SUBCUTANEOUS PEN INJECTOR: 30 days supply | Qty: 1 | Fill #11 | Status: AC

## 2019-02-20 MED FILL — EMGALITY PEN 120 MG/ML SUBCUTANEOUS PEN INJECTOR: SUBCUTANEOUS | 30 days supply | Qty: 1 | Fill #11

## 2019-03-01 DIAGNOSIS — R269 Unspecified abnormalities of gait and mobility: Secondary | ICD-10-CM

## 2019-03-01 DIAGNOSIS — M25562 Pain in left knee: Secondary | ICD-10-CM

## 2019-03-01 DIAGNOSIS — G8929 Other chronic pain: Secondary | ICD-10-CM

## 2019-03-01 DIAGNOSIS — R29898 Other symptoms and signs involving the musculoskeletal system: Secondary | ICD-10-CM

## 2019-03-01 DIAGNOSIS — S83242D Other tear of medial meniscus, current injury, left knee, subsequent encounter: Secondary | ICD-10-CM

## 2019-03-01 MED FILL — CYCLOBENZAPRINE 5 MG TABLET: ORAL | 20 days supply | Qty: 60 | Fill #1

## 2019-03-01 MED FILL — CYCLOBENZAPRINE 5 MG TABLET: 20 days supply | Qty: 60 | Fill #1 | Status: AC

## 2019-03-27 DIAGNOSIS — G43709 Chronic migraine without aura, not intractable, without status migrainosus: Principal | ICD-10-CM

## 2019-03-27 MED ORDER — EMGALITY PEN 120 MG/ML SUBCUTANEOUS PEN INJECTOR: 120 mg | mL | 11 refills | 30 days | Status: AC

## 2019-03-27 MED ORDER — EMGALITY PEN 120 MG/ML SUBCUTANEOUS PEN INJECTOR: 120 mg | mL | 11 refills | 28 days | Status: AC

## 2019-03-28 DIAGNOSIS — F4310 Post-traumatic stress disorder, unspecified: Principal | ICD-10-CM

## 2019-03-28 DIAGNOSIS — G4700 Insomnia, unspecified: Principal | ICD-10-CM

## 2019-03-28 DIAGNOSIS — G540 Brachial plexus disorders: Principal | ICD-10-CM

## 2019-03-28 DIAGNOSIS — M5481 Occipital neuralgia: Principal | ICD-10-CM

## 2019-03-28 DIAGNOSIS — G43811 Other migraine, intractable, with status migrainosus: Principal | ICD-10-CM

## 2019-03-28 DIAGNOSIS — G43709 Chronic migraine without aura, not intractable, without status migrainosus: Principal | ICD-10-CM

## 2019-04-04 DIAGNOSIS — G4700 Insomnia, unspecified: Principal | ICD-10-CM

## 2019-04-04 DIAGNOSIS — G540 Brachial plexus disorders: Principal | ICD-10-CM

## 2019-04-04 DIAGNOSIS — G43811 Other migraine, intractable, with status migrainosus: Principal | ICD-10-CM

## 2019-04-04 DIAGNOSIS — F4310 Post-traumatic stress disorder, unspecified: Principal | ICD-10-CM

## 2019-04-04 DIAGNOSIS — M5481 Occipital neuralgia: Principal | ICD-10-CM

## 2019-04-04 DIAGNOSIS — G43709 Chronic migraine without aura, not intractable, without status migrainosus: Principal | ICD-10-CM

## 2019-04-05 MED ORDER — LORAZEPAM 1 MG TABLET
ORAL_TABLET | Freq: Every day | ORAL | 0 refills | 30.00000 days | Status: CP | PRN
Start: 2019-04-05 — End: ?
  Filled 2019-04-05: qty 30, 30d supply, fill #0

## 2019-04-05 MED ORDER — HYDROCODONE 5 MG-ACETAMINOPHEN 325 MG TABLET
ORAL_TABLET | Freq: Every day | ORAL | 0 refills | 30.00000 days | Status: CP | PRN
Start: 2019-04-05 — End: ?
  Filled 2019-04-05: qty 30, 30d supply, fill #0

## 2019-04-05 MED ORDER — ZOLPIDEM 5 MG TABLET
ORAL_TABLET | Freq: Every evening | ORAL | 0 refills | 60 days | Status: CP | PRN
Start: 2019-04-05 — End: 2019-06-04
  Filled 2019-04-05: qty 30, 60d supply, fill #0

## 2019-04-05 MED FILL — LORAZEPAM 1 MG TABLET: 30 days supply | Qty: 30 | Fill #0 | Status: AC

## 2019-04-05 MED FILL — ZOLPIDEM 5 MG TABLET: 60 days supply | Qty: 30 | Fill #0 | Status: AC

## 2019-04-05 MED FILL — HYDROCODONE 5 MG-ACETAMINOPHEN 325 MG TABLET: 30 days supply | Qty: 30 | Fill #0 | Status: AC

## 2019-04-23 ENCOUNTER — Encounter
Admit: 2019-04-23 | Discharge: 2019-04-24 | Payer: BLUE CROSS/BLUE SHIELD | Attending: Family Medicine | Primary: Family Medicine

## 2019-04-23 MED ORDER — CYCLOBENZAPRINE 5 MG TABLET
ORAL_TABLET | Freq: Three times a day (TID) | ORAL | 1 refills | 20 days | Status: CP
Start: 2019-04-23 — End: ?

## 2019-04-23 MED ORDER — VENLAFAXINE ER 150 MG CAPSULE,EXTENDED RELEASE 24 HR
ORAL_CAPSULE | Freq: Every day | ORAL | 3 refills | 90.00000 days | Status: CP
Start: 2019-04-23 — End: 2020-04-22
  Filled 2019-04-25: qty 90, 90d supply, fill #0

## 2019-04-23 MED ORDER — BUTALBITAL-ACETAMINOPHEN-CAFFEINE 50 MG-325 MG-40 MG TABLET
ORAL_TABLET | Freq: Every day | ORAL | 0 refills | 10 days | Status: CP | PRN
Start: 2019-04-23 — End: ?

## 2019-04-25 MED FILL — EMGALITY PEN 120 MG/ML SUBCUTANEOUS PEN INJECTOR: 28 days supply | Qty: 1 | Fill #0 | Status: AC

## 2019-04-25 MED FILL — EMGALITY PEN 120 MG/ML SUBCUTANEOUS PEN INJECTOR: SUBCUTANEOUS | 28 days supply | Qty: 1 | Fill #0

## 2019-04-25 MED FILL — VENLAFAXINE ER 150 MG CAPSULE,EXTENDED RELEASE 24 HR: 90 days supply | Qty: 90 | Fill #0 | Status: AC

## 2019-05-06 MED ORDER — LORAZEPAM 1 MG TABLET
ORAL_TABLET | Freq: Every day | ORAL | 5 refills | 30.00000 days | Status: CP | PRN
Start: 2019-05-06 — End: ?

## 2019-05-06 MED ORDER — ZOLPIDEM 5 MG TABLET
ORAL_TABLET | Freq: Every evening | ORAL | 5 refills | 30.00000 days | Status: CP | PRN
Start: 2019-05-06 — End: 2019-06-05

## 2019-05-06 MED ORDER — HYDROCODONE 5 MG-ACETAMINOPHEN 325 MG TABLET
ORAL_TABLET | Freq: Every day | ORAL | 0 refills | 30.00000 days | Status: CP | PRN
Start: 2019-05-06 — End: ?

## 2019-05-27 MED FILL — EMGALITY PEN 120 MG/ML SUBCUTANEOUS PEN INJECTOR: 28 days supply | Qty: 1 | Fill #1 | Status: AC

## 2019-05-27 MED FILL — EMGALITY PEN 120 MG/ML SUBCUTANEOUS PEN INJECTOR: SUBCUTANEOUS | 28 days supply | Qty: 1 | Fill #1

## 2019-06-10 ENCOUNTER — Encounter: Admit: 2019-06-10 | Discharge: 2019-06-11 | Payer: BLUE CROSS/BLUE SHIELD

## 2019-06-10 ENCOUNTER — Ambulatory Visit: Admit: 2019-06-10 | Discharge: 2019-06-11 | Payer: BLUE CROSS/BLUE SHIELD

## 2019-06-12 ENCOUNTER — Encounter: Admit: 2019-06-12 | Discharge: 2019-06-13 | Payer: BLUE CROSS/BLUE SHIELD

## 2019-06-12 DIAGNOSIS — M7052 Other bursitis of knee, left knee: Principal | ICD-10-CM

## 2019-06-13 ENCOUNTER — Encounter: Admit: 2019-06-13 | Discharge: 2019-06-13 | Payer: BLUE CROSS/BLUE SHIELD

## 2019-06-17 MED ORDER — TIZANIDINE 4 MG CAPSULE
ORAL_CAPSULE | Freq: Three times a day (TID) | ORAL | 11 refills | 30 days | Status: CP
Start: 2019-06-17 — End: 2020-06-16

## 2019-06-17 MED ORDER — RIZATRIPTAN 10 MG DISINTEGRATING TABLET
ORAL_TABLET | 11 refills | 0 days | Status: CP
Start: 2019-06-17 — End: 2020-06-17

## 2019-06-17 MED ORDER — QUETIAPINE 50 MG TABLET
ORAL_TABLET | 0 refills | 0 days | Status: CP
Start: 2019-06-17 — End: 2020-06-16

## 2019-06-17 MED ORDER — NURTEC ODT 75 MG DISINTEGRATING TABLET
ORAL_TABLET | Freq: Every day | ORAL | 3 refills | 8.00000 days | Status: CP | PRN
Start: 2019-06-17 — End: 2019-09-15

## 2019-06-19 ENCOUNTER — Ambulatory Visit: Payer: BC Managed Care – PPO | Attending: Internal Medicine

## 2019-06-19 DIAGNOSIS — Z20822 Contact with and (suspected) exposure to covid-19: Secondary | ICD-10-CM

## 2019-06-20 LAB — NOVEL CORONAVIRUS, NAA: SARS-CoV-2, NAA: NOT DETECTED

## 2019-06-25 ENCOUNTER — Encounter: Admit: 2019-06-25 | Discharge: 2019-06-25 | Payer: BLUE CROSS/BLUE SHIELD

## 2019-06-25 ENCOUNTER — Encounter: Admit: 2019-06-25 | Discharge: 2019-06-25 | Payer: BLUE CROSS/BLUE SHIELD | Attending: Family | Primary: Family

## 2019-06-25 DIAGNOSIS — G43709 Chronic migraine without aura, not intractable, without status migrainosus: Principal | ICD-10-CM

## 2019-06-25 MED ORDER — QUETIAPINE 50 MG TABLET
ORAL_TABLET | 1 refills | 0 days | Status: CP
Start: 2019-06-25 — End: ?

## 2019-07-05 MED FILL — EMGALITY PEN 120 MG/ML SUBCUTANEOUS PEN INJECTOR: 28 days supply | Qty: 1 | Fill #2 | Status: AC

## 2019-07-05 MED FILL — EMGALITY PEN 120 MG/ML SUBCUTANEOUS PEN INJECTOR: SUBCUTANEOUS | 28 days supply | Qty: 1 | Fill #2

## 2019-07-08 DIAGNOSIS — G540 Brachial plexus disorders: Principal | ICD-10-CM

## 2019-07-08 MED ORDER — HYDROCODONE 5 MG-ACETAMINOPHEN 325 MG TABLET
ORAL_TABLET | Freq: Every day | ORAL | 0 refills | 30 days | Status: CP | PRN
Start: 2019-07-08 — End: ?

## 2019-08-21 ENCOUNTER — Encounter: Admit: 2019-08-21 | Discharge: 2019-08-22 | Payer: BLUE CROSS/BLUE SHIELD

## 2019-08-21 DIAGNOSIS — M7042 Prepatellar bursitis, left knee: Principal | ICD-10-CM

## 2019-08-21 DIAGNOSIS — M7050 Other bursitis of knee, unspecified knee: Principal | ICD-10-CM

## 2019-08-21 MED FILL — VENLAFAXINE ER 150 MG CAPSULE,EXTENDED RELEASE 24 HR: 90 days supply | Qty: 90 | Fill #1 | Status: AC

## 2019-08-21 MED FILL — VENLAFAXINE ER 150 MG CAPSULE,EXTENDED RELEASE 24 HR: ORAL | 90 days supply | Qty: 90 | Fill #1

## 2019-08-21 MED FILL — EMGALITY PEN 120 MG/ML SUBCUTANEOUS PEN INJECTOR: SUBCUTANEOUS | 28 days supply | Qty: 1 | Fill #3

## 2019-08-21 MED FILL — EMGALITY PEN 120 MG/ML SUBCUTANEOUS PEN INJECTOR: 28 days supply | Qty: 1 | Fill #3 | Status: AC

## 2019-09-10 DIAGNOSIS — G43709 Chronic migraine without aura, not intractable, without status migrainosus: Principal | ICD-10-CM

## 2019-09-10 DIAGNOSIS — G540 Brachial plexus disorders: Principal | ICD-10-CM

## 2019-09-10 MED ORDER — HYDROCODONE 5 MG-ACETAMINOPHEN 325 MG TABLET
ORAL_TABLET | Freq: Every day | ORAL | 0 refills | 30.00000 days | Status: CP | PRN
Start: 2019-09-10 — End: ?

## 2019-09-10 MED ORDER — TIZANIDINE 4 MG TABLET
ORAL_TABLET | Freq: Three times a day (TID) | ORAL | 8 refills | 30.00000 days | Status: CP
Start: 2019-09-10 — End: 2020-09-09

## 2019-09-11 MED ORDER — VALACYCLOVIR 1 GRAM TABLET
ORAL_TABLET | Freq: Two times a day (BID) | ORAL | 1 refills | 45.00000 days | Status: CP | PRN
Start: 2019-09-11 — End: 2019-09-12

## 2019-10-08 MED FILL — EMGALITY PEN 120 MG/ML SUBCUTANEOUS PEN INJECTOR: SUBCUTANEOUS | 28 days supply | Qty: 1 | Fill #4

## 2019-10-08 MED FILL — EMGALITY PEN 120 MG/ML SUBCUTANEOUS PEN INJECTOR: 28 days supply | Qty: 1 | Fill #4 | Status: AC

## 2019-11-12 MED FILL — VENLAFAXINE ER 150 MG CAPSULE,EXTENDED RELEASE 24 HR: 90 days supply | Qty: 90 | Fill #2 | Status: AC

## 2019-11-12 MED FILL — EMGALITY PEN 120 MG/ML SUBCUTANEOUS PEN INJECTOR: 28 days supply | Qty: 1 | Fill #5 | Status: AC

## 2019-11-12 MED FILL — EMGALITY PEN 120 MG/ML SUBCUTANEOUS PEN INJECTOR: SUBCUTANEOUS | 28 days supply | Qty: 1 | Fill #5

## 2019-11-12 MED FILL — VENLAFAXINE ER 150 MG CAPSULE,EXTENDED RELEASE 24 HR: ORAL | 90 days supply | Qty: 90 | Fill #2

## 2019-12-08 DIAGNOSIS — G43709 Chronic migraine without aura, not intractable, without status migrainosus: Principal | ICD-10-CM

## 2019-12-08 MED ORDER — QUETIAPINE 50 MG TABLET
ORAL_TABLET | 1 refills | 0 days
Start: 2019-12-08 — End: ?

## 2019-12-15 MED ORDER — QUETIAPINE 50 MG TABLET
ORAL_TABLET | Freq: Once | ORAL | 1 refills | 0.00000 days | Status: CP | PRN
Start: 2019-12-15 — End: 2020-02-13

## 2019-12-27 MED ORDER — NURTEC ODT 75 MG DISINTEGRATING TABLET
0.00000 days
Start: 2019-12-27 — End: 2020-01-08

## 2019-12-31 MED FILL — VALACYCLOVIR 1 GRAM TABLET: ORAL | 45 days supply | Qty: 90 | Fill #0

## 2019-12-31 MED FILL — VALACYCLOVIR 1 GRAM TABLET: 45 days supply | Qty: 90 | Fill #0 | Status: AC

## 2019-12-31 MED FILL — EMGALITY PEN 120 MG/ML SUBCUTANEOUS PEN INJECTOR: 28 days supply | Qty: 1 | Fill #6 | Status: AC

## 2019-12-31 MED FILL — EMGALITY PEN 120 MG/ML SUBCUTANEOUS PEN INJECTOR: SUBCUTANEOUS | 28 days supply | Qty: 1 | Fill #6

## 2020-01-08 ENCOUNTER — Telehealth: Admit: 2020-01-08 | Discharge: 2020-01-09 | Payer: BLUE CROSS/BLUE SHIELD | Attending: Neurology | Primary: Neurology

## 2020-01-08 DIAGNOSIS — G43801 Other migraine, not intractable, with status migrainosus: Principal | ICD-10-CM

## 2020-01-08 DIAGNOSIS — G43709 Chronic migraine without aura, not intractable, without status migrainosus: Principal | ICD-10-CM

## 2020-01-08 MED ORDER — GABAPENTIN 100 MG CAPSULE
ORAL_CAPSULE | Freq: Every evening | ORAL | 11 refills | 30 days | Status: CP
Start: 2020-01-08 — End: 2021-01-07

## 2020-01-08 MED ORDER — QUETIAPINE 25 MG TABLET
ORAL_TABLET | Freq: Every evening | ORAL | 0 refills | 90 days | Status: CP
Start: 2020-01-08 — End: 2020-04-07

## 2020-01-08 MED ORDER — NURTEC ODT 75 MG DISINTEGRATING TABLET
ORAL_TABLET | Freq: Every day | ORAL | 0 refills | 24.00000 days | Status: CP | PRN
Start: 2020-01-08 — End: 2020-04-07

## 2020-01-08 MED ORDER — METHYLPREDNISOLONE 4 MG TABLETS IN A DOSE PACK
0 refills | 0 days | Status: CP
Start: 2020-01-08 — End: 2021-01-07

## 2020-01-08 MED ORDER — EMGALITY PEN 120 MG/ML SUBCUTANEOUS PEN INJECTOR
SUBCUTANEOUS | 3 refills | 84 days | Status: CP
Start: 2020-01-08 — End: 2021-01-07
  Filled 2020-02-04: qty 1, 28d supply, fill #0

## 2020-01-31 MED ORDER — AMOXICILLIN 875 MG-POTASSIUM CLAVULANATE 125 MG TABLET
ORAL_TABLET | Freq: Two times a day (BID) | ORAL | 0 refills | 7 days | Status: CP
Start: 2020-01-31 — End: 2020-02-07

## 2020-02-03 DIAGNOSIS — G43801 Other migraine, not intractable, with status migrainosus: Principal | ICD-10-CM

## 2020-02-03 DIAGNOSIS — F4310 Post-traumatic stress disorder, unspecified: Principal | ICD-10-CM

## 2020-02-03 DIAGNOSIS — G540 Brachial plexus disorders: Principal | ICD-10-CM

## 2020-02-03 DIAGNOSIS — G43709 Chronic migraine without aura, not intractable, without status migrainosus: Principal | ICD-10-CM

## 2020-02-03 MED ORDER — GABAPENTIN 300 MG CAPSULE
ORAL_CAPSULE | Freq: Three times a day (TID) | ORAL | 1 refills | 30 days
Start: 2020-02-03 — End: 2021-02-02

## 2020-02-03 MED ORDER — NURTEC ODT 75 MG DISINTEGRATING TABLET
ORAL_TABLET | Freq: Every day | ORAL | 0 refills | 24.00000 days | PRN
Start: 2020-02-03 — End: 2020-05-03

## 2020-02-03 MED ORDER — LORAZEPAM 1 MG TABLET
ORAL_TABLET | Freq: Every day | ORAL | 5 refills | 30.00000 days | PRN
Start: 2020-02-03 — End: ?

## 2020-02-03 MED ORDER — HYDROCODONE 5 MG-ACETAMINOPHEN 325 MG TABLET
ORAL_TABLET | Freq: Every day | ORAL | 0 refills | 30.00000 days | PRN
Start: 2020-02-03 — End: ?

## 2020-02-04 MED ORDER — HYDROCODONE 5 MG-ACETAMINOPHEN 325 MG TABLET
ORAL_TABLET | Freq: Every day | ORAL | 0 refills | 30 days | Status: CP | PRN
Start: 2020-02-04 — End: ?

## 2020-02-04 MED ORDER — LORAZEPAM 1 MG TABLET
ORAL_TABLET | Freq: Every day | ORAL | 5 refills | 30 days | Status: CP | PRN
Start: 2020-02-04 — End: ?

## 2020-02-04 MED ORDER — NARCAN 4 MG/ACTUATION NASAL SPRAY
0 days
Start: 2020-02-04 — End: ?

## 2020-02-04 MED ORDER — QUETIAPINE 25 MG TABLET
ORAL_TABLET | Freq: Every evening | ORAL | 11 refills | 30.00000 days | Status: CP
Start: 2020-02-04 — End: 2020-05-04

## 2020-02-04 MED FILL — VENLAFAXINE ER 150 MG CAPSULE,EXTENDED RELEASE 24 HR: 90 days supply | Qty: 90 | Fill #3 | Status: AC

## 2020-02-04 MED FILL — EMGALITY PEN 120 MG/ML SUBCUTANEOUS PEN INJECTOR: 28 days supply | Qty: 1 | Fill #0 | Status: AC

## 2020-02-04 MED FILL — VENLAFAXINE ER 150 MG CAPSULE,EXTENDED RELEASE 24 HR: ORAL | 90 days supply | Qty: 90 | Fill #3

## 2020-02-12 ENCOUNTER — Ambulatory Visit
Admit: 2020-02-12 | Discharge: 2020-02-13 | Payer: BLUE CROSS/BLUE SHIELD | Attending: Family Medicine | Primary: Family Medicine

## 2020-02-12 MED ORDER — METHYLPREDNISOLONE 4 MG TABLETS IN A DOSE PACK
Freq: Every day | ORAL | 0 refills | 1 days | Status: CP
Start: 2020-02-12 — End: 2021-02-11

## 2020-02-12 MED ORDER — CIPROFLOXACIN 0.3 %-DEXAMETHASONE 0.1 % EAR DROPS,SUSPENSION
Freq: Two times a day (BID) | OTIC | 0 refills | 19.00000 days | Status: CP
Start: 2020-02-12 — End: ?

## 2020-02-12 MED ORDER — LIDOCAINE 5 % TOPICAL PATCH
MEDICATED_PATCH | Freq: Two times a day (BID) | TRANSDERMAL | 0 refills | 5.00000 days | Status: CP
Start: 2020-02-12 — End: 2021-02-11

## 2020-02-25 DIAGNOSIS — G43709 Chronic migraine without aura, not intractable, without status migrainosus: Principal | ICD-10-CM

## 2020-02-26 MED ORDER — QUETIAPINE 50 MG TABLET
ORAL_TABLET | ORAL | 1 refills | 0.00000 days | Status: CP
Start: 2020-02-26 — End: ?

## 2020-03-19 MED FILL — EMGALITY PEN 120 MG/ML SUBCUTANEOUS PEN INJECTOR: 28 days supply | Qty: 1 | Fill #1 | Status: AC

## 2020-03-19 MED FILL — EMGALITY PEN 120 MG/ML SUBCUTANEOUS PEN INJECTOR: SUBCUTANEOUS | 28 days supply | Qty: 1 | Fill #1

## 2020-03-29 MED ORDER — QUETIAPINE 50 MG TABLET
ORAL_TABLET | 1 refills | 0.00000 days
Start: 2020-03-29 — End: ?

## 2020-04-04 MED ORDER — QUETIAPINE 50 MG TABLET
ORAL_TABLET | Freq: Every evening | ORAL | 3 refills | 30 days | Status: CP | PRN
Start: 2020-04-04 — End: 2021-04-04

## 2020-04-24 DIAGNOSIS — G43801 Other migraine, not intractable, with status migrainosus: Principal | ICD-10-CM

## 2020-04-24 MED ORDER — QUETIAPINE 50 MG TABLET
ORAL_TABLET | Freq: Every evening | ORAL | 11 refills | 30.00000 days | Status: CP | PRN
Start: 2020-04-24 — End: 2021-04-24

## 2020-05-04 MED ORDER — VENLAFAXINE ER 150 MG CAPSULE,EXTENDED RELEASE 24 HR
ORAL_CAPSULE | Freq: Every day | ORAL | 2 refills | 90 days | Status: CP
Start: 2020-05-04 — End: 2021-05-04
  Filled 2020-05-07: qty 90, 90d supply, fill #0

## 2020-05-07 DIAGNOSIS — G43801 Other migraine, not intractable, with status migrainosus: Principal | ICD-10-CM

## 2020-05-07 MED FILL — VENLAFAXINE ER 150 MG CAPSULE,EXTENDED RELEASE 24 HR: 90 days supply | Qty: 90 | Fill #0 | Status: AC

## 2020-05-11 DIAGNOSIS — G540 Brachial plexus disorders: Principal | ICD-10-CM

## 2020-05-11 DIAGNOSIS — F4310 Post-traumatic stress disorder, unspecified: Principal | ICD-10-CM

## 2020-05-11 MED ORDER — MAGNESIUM 200 MG TABLET
ORAL | 0 refills | 0.00000 days | Status: CN
Start: 2020-05-11 — End: ?

## 2020-05-11 MED FILL — EMGALITY PEN 120 MG/ML SUBCUTANEOUS PEN INJECTOR: 28 days supply | Qty: 1 | Fill #2 | Status: AC

## 2020-05-11 MED FILL — EMGALITY PEN 120 MG/ML SUBCUTANEOUS PEN INJECTOR: SUBCUTANEOUS | 28 days supply | Qty: 1 | Fill #2

## 2020-05-12 DIAGNOSIS — F4310 Post-traumatic stress disorder, unspecified: Principal | ICD-10-CM

## 2020-05-12 DIAGNOSIS — G540 Brachial plexus disorders: Principal | ICD-10-CM

## 2020-05-12 MED ORDER — HYDROCODONE 5 MG-ACETAMINOPHEN 325 MG TABLET
ORAL_TABLET | Freq: Every day | ORAL | 0 refills | 30.00000 days | Status: CP | PRN
Start: 2020-05-12 — End: 2020-06-26

## 2020-05-12 MED ORDER — LORAZEPAM 1 MG TABLET
ORAL_TABLET | Freq: Every day | ORAL | 0 refills | 30.00000 days | Status: CP | PRN
Start: 2020-05-12 — End: ?

## 2020-05-12 MED ORDER — LORAZEPAM 1 MG TABLET: 1 mg | tablet | Freq: Every day | 0 refills | 30 days | Status: AC

## 2020-06-08 DIAGNOSIS — G43801 Other migraine, not intractable, with status migrainosus: Principal | ICD-10-CM

## 2020-06-08 MED ORDER — QUETIAPINE 50 MG TABLET
ORAL_TABLET | Freq: Every evening | ORAL | 11 refills | 30 days | Status: CP | PRN
Start: 2020-06-08 — End: 2021-06-08

## 2020-06-08 MED FILL — EMGALITY PEN 120 MG/ML SUBCUTANEOUS PEN INJECTOR: 28 days supply | Qty: 1 | Fill #3 | Status: AC

## 2020-06-08 MED FILL — EMGALITY PEN 120 MG/ML SUBCUTANEOUS PEN INJECTOR: SUBCUTANEOUS | 28 days supply | Qty: 1 | Fill #3

## 2020-06-16 ENCOUNTER — Encounter: Admit: 2020-06-16 | Discharge: 2020-06-17 | Payer: PRIVATE HEALTH INSURANCE | Attending: Neurology | Primary: Neurology

## 2020-06-16 DIAGNOSIS — G43719 Chronic migraine without aura, intractable, without status migrainosus: Principal | ICD-10-CM

## 2020-06-26 ENCOUNTER — Encounter
Admit: 2020-06-26 | Discharge: 2020-06-27 | Payer: PRIVATE HEALTH INSURANCE | Attending: Family Medicine | Primary: Family Medicine

## 2020-06-26 DIAGNOSIS — F4310 Post-traumatic stress disorder, unspecified: Principal | ICD-10-CM

## 2020-06-26 DIAGNOSIS — G43009 Migraine without aura, not intractable, without status migrainosus: Principal | ICD-10-CM

## 2020-06-26 DIAGNOSIS — G540 Brachial plexus disorders: Principal | ICD-10-CM

## 2020-06-26 MED ORDER — ONDANSETRON 8 MG DISINTEGRATING TABLET
ORAL_TABLET | Freq: Three times a day (TID) | ORAL | 3 refills | 10 days | Status: CP | PRN
Start: 2020-06-26 — End: 2020-07-03

## 2020-06-26 MED ORDER — HYDROCODONE 5 MG-ACETAMINOPHEN 325 MG TABLET
ORAL_TABLET | Freq: Every day | ORAL | 0 refills | 30 days | Status: CP | PRN
Start: 2020-06-26 — End: ?

## 2020-07-07 MED FILL — EMGALITY PEN 120 MG/ML SUBCUTANEOUS PEN INJECTOR: SUBCUTANEOUS | 28 days supply | Qty: 1 | Fill #4

## 2020-07-21 ENCOUNTER — Encounter: Admit: 2020-07-21 | Discharge: 2020-07-22 | Payer: PRIVATE HEALTH INSURANCE

## 2020-07-21 DIAGNOSIS — R059 Cough: Principal | ICD-10-CM

## 2020-07-21 DIAGNOSIS — J0111 Acute recurrent frontal sinusitis: Principal | ICD-10-CM

## 2020-07-21 MED ORDER — METHYLPREDNISOLONE 4 MG TABLETS IN A DOSE PACK
0 refills | 0 days | Status: CP
Start: 2020-07-21 — End: ?

## 2020-07-21 MED ORDER — AMOXICILLIN 875 MG-POTASSIUM CLAVULANATE 125 MG TABLET
ORAL_TABLET | Freq: Two times a day (BID) | ORAL | 0 refills | 10 days | Status: CP
Start: 2020-07-21 — End: 2020-07-31

## 2020-07-21 MED ORDER — BENZONATATE 200 MG CAPSULE
ORAL_CAPSULE | Freq: Three times a day (TID) | ORAL | 0 refills | 10 days | Status: CP | PRN
Start: 2020-07-21 — End: 2020-07-31

## 2020-08-03 MED FILL — VALACYCLOVIR 1 GRAM TABLET: ORAL | 45 days supply | Qty: 90 | Fill #1

## 2020-08-03 MED FILL — EMGALITY PEN 120 MG/ML SUBCUTANEOUS PEN INJECTOR: SUBCUTANEOUS | 28 days supply | Qty: 1 | Fill #5

## 2020-08-03 MED FILL — VENLAFAXINE ER 150 MG CAPSULE,EXTENDED RELEASE 24 HR: ORAL | 90 days supply | Qty: 90 | Fill #1

## 2020-08-04 MED ORDER — TIZANIDINE 4 MG TABLET
ORAL_TABLET | Freq: Three times a day (TID) | ORAL | 2 refills | 90 days | Status: CP
Start: 2020-08-04 — End: 2021-08-04

## 2020-09-03 DIAGNOSIS — G43801 Other migraine, not intractable, with status migrainosus: Principal | ICD-10-CM

## 2020-09-03 DIAGNOSIS — G43719 Chronic migraine without aura, intractable, without status migrainosus: Principal | ICD-10-CM

## 2020-09-03 MED ORDER — UBRELVY 100 MG TABLET
ORAL_TABLET | Freq: Every day | ORAL | 3 refills | 30 days | Status: CP | PRN
Start: 2020-09-03 — End: 2021-09-03

## 2020-09-09 MED FILL — EMGALITY PEN 120 MG/ML SUBCUTANEOUS PEN INJECTOR: SUBCUTANEOUS | 28 days supply | Qty: 1 | Fill #6

## 2020-09-15 DIAGNOSIS — G540 Brachial plexus disorders: Principal | ICD-10-CM

## 2020-09-15 DIAGNOSIS — F4310 Post-traumatic stress disorder, unspecified: Principal | ICD-10-CM

## 2020-09-15 MED ORDER — HYDROCODONE 5 MG-ACETAMINOPHEN 325 MG TABLET
ORAL_TABLET | Freq: Every day | ORAL | 0 refills | 30.00000 days | Status: CP | PRN
Start: 2020-09-15 — End: ?

## 2020-09-15 MED ORDER — LORAZEPAM 1 MG TABLET
ORAL_TABLET | Freq: Every day | ORAL | 0 refills | 30 days | Status: CP | PRN
Start: 2020-09-15 — End: ?

## 2020-09-15 MED ORDER — TIZANIDINE 4 MG TABLET
ORAL_TABLET | Freq: Three times a day (TID) | ORAL | 2 refills | 90 days
Start: 2020-09-15 — End: 2021-09-15

## 2020-09-16 MED ORDER — TIZANIDINE 4 MG TABLET
ORAL_TABLET | Freq: Three times a day (TID) | ORAL | 2 refills | 90.00000 days
Start: 2020-09-16 — End: 2021-09-16

## 2020-09-16 MED ORDER — NYSTATIN 100,000 UNIT/GRAM TOPICAL POWDER
1 refills | 0 days | Status: CP
Start: 2020-09-16 — End: 2021-09-16

## 2020-09-23 DIAGNOSIS — G43719 Chronic migraine without aura, intractable, without status migrainosus: Principal | ICD-10-CM

## 2020-09-23 DIAGNOSIS — G43801 Other migraine, not intractable, with status migrainosus: Principal | ICD-10-CM

## 2020-09-23 MED ORDER — UBRELVY 100 MG TABLET
ORAL_TABLET | Freq: Every day | ORAL | 3 refills | 30 days | Status: CP | PRN
Start: 2020-09-23 — End: 2021-09-23

## 2020-10-06 ENCOUNTER — Other Ambulatory Visit: Payer: Self-pay

## 2020-10-06 ENCOUNTER — Ambulatory Visit
Admission: EM | Admit: 2020-10-06 | Discharge: 2020-10-06 | Disposition: A | Discharge status: Home / Self Care | Payer: BC Managed Care – PPO | Attending: Emergency Medicine | Admitting: Emergency Medicine

## 2020-10-06 DIAGNOSIS — N390 Urinary tract infection, site not specified: Secondary | ICD-10-CM | POA: Diagnosis present

## 2020-10-06 LAB — POCT URINALYSIS DIP (MANUAL ENTRY)
Bilirubin, UA: NEGATIVE
Glucose, UA: NEGATIVE mg/dL
Ketones, POC UA: NEGATIVE mg/dL
Nitrite, UA: POSITIVE — AB
Protein Ur, POC: NEGATIVE mg/dL
Spec Grav, UA: 1.015 (ref 1.010–1.025)
Urobilinogen, UA: 1 E.U./dL
pH, UA: 7.5 (ref 5.0–8.0)

## 2020-10-06 LAB — POCT URINE PREGNANCY: Preg Test, Ur: NEGATIVE

## 2020-10-06 MED ORDER — CEPHALEXIN 500 MG PO CAPS: 500.0000 mg | ORAL_CAPSULE | Freq: Two times a day (BID) | ORAL | 0 refills | Status: AC

## 2020-10-06 MED ORDER — PHENAZOPYRIDINE HCL 200 MG PO TABS: 200.0000 mg | ORAL_TABLET | Freq: Three times a day (TID) | ORAL | 0 refills | Status: DC

## 2020-10-06 MED FILL — EMGALITY PEN 120 MG/ML SUBCUTANEOUS PEN INJECTOR: SUBCUTANEOUS | 28 days supply | Qty: 1 | Fill #7

## 2020-10-06 NOTE — ED Provider Notes (Signed)
[facility]    CSN: [id number] Arrival date & time: 10/06/20  1048      History   Chief Complaint Chief Complaint  Patient presents with  . Urinary Tract Infection    HPI Brooke Rush is a 45 y.o. female presenting today for evaluation of possible UTI.  Reports associated urinary frequency urgency and burning.  Symptoms began 2 days ago.  Reports history of UTIs and stones.  She has had some chills nausea and some back pain.  Denies known fevers.  Using Urogesic-Blue for discomfort  HPI  Past Medical History:  Diagnosis Date  . Arthritis    in her neck  . Constipation    2/2 pain medication  . Fibromyalgia    Diagnosed by Dr. Derrel Nip  . GERD (gastroesophageal reflux disease)   . Kidney stones   . Migraines     Patient Active Problem List   Diagnosis Date Noted  . Insulin controlled gestational diabetes mellitus (GDM) in third trimester 10/16/2017  . Gestational diabetes 10/10/2017  . NST (non-stress test) reactive 10/02/2017  . Pregnancy 09/30/2017  . Preterm premature rupture of membranes, antepartum 09/14/2017  . Labor and delivery indication for care or intervention 07/13/2017  . Advanced maternal age in multigravida, first trimester   . Family history of SIDS (sudden infant death syndrome)   . Supervision of high risk pregnancy in third trimester 03/27/2017  . Chronic bilateral low back pain with bilateral sciatica 09/12/2016  . Hip pain 09/12/2016  . Thoracic outlet syndrome 06/03/2016  . Anxiety 06/03/2016  . Migraine 06/03/2016  . Fibromyalgia 08/05/2013  . Hypertriglyceridemia 08/05/2013  . Fatigue 08/05/2013  . Tobacco abuse 08/05/2013    Past Surgical History:  Procedure Laterality Date  . CARPAL TUNNEL RELEASE Bilateral 1998  . CHOLECYSTECTOMY  2011  . ETHMOIDECTOMY Bilateral 12/02/2015   Procedure: LEFT TOTAL ETHMOIDECTOMY  RIGHT  REVISION TOTAL ETHMOIDECTOMY;  Surgeon: Carloyn Manner, MD;  Location: [place];   Service: ENT;  Laterality: Bilateral;  . FRONTAL SINUS EXPLORATION Bilateral 12/02/2015   Procedure: FRONTAL SINUSOTOMY;  Surgeon: Carloyn Manner, MD;  Location: [place];  Service: ENT;  Laterality: Bilateral;  . HEMORROIDECTOMY     Dr. Tamala Julian  . IMAGE GUIDED SINUS SURGERY N/A 12/02/2015   Procedure: IMAGE GUIDED SINUS SURGERY;  Surgeon: Carloyn Manner, MD;  Location: [place];  Service: ENT;  Laterality: N/A;  GAVE DISK TO [facility] 6/22 DEE GEL [place]  . LEEP    . MAXILLARY ANTROSTOMY Left 12/02/2015   Procedure: MAXILLARY ANTROSTOMY;  Surgeon: Carloyn Manner, MD;  Location: [place];  Service: ENT;  Laterality: Left;  . NASAL SEPTUM SURGERY  2011  . NASAL SINUS SURGERY  2011   Dr. Pryor Ochoa  . REMOVAL OF EAR TUBE Bilateral 12/02/2015   Procedure: Bilateral Tube removal with Gel Film Myringoplasty;  Surgeon: Carloyn Manner, MD;  Location: [place];  Service: ENT;  Laterality: Bilateral;  . TONSILLECTOMY AND ADENOIDECTOMY  2013    OB History    Gravida  8   Para  4   Term  4   Preterm      AB  4   Living  3     SAB  4   IAB      Ectopic      Multiple  0   Live Births  4            Home Medications    Prior to  Admission medications   Medication Sig Start Date End Date Taking? Authorizing Provider  cephALEXin (KEFLEX) 500 MG capsule Take 1 capsule (500 mg total) by mouth 2 (two) times daily for 7 days. 10/06/20 10/13/20 Yes Desmin Daleo C, PA-C  phenazopyridine (PYRIDIUM) 200 MG tablet Take 1 tablet (200 mg total) by mouth 3 (three) times daily. 10/06/20  Yes Anastasia Tompson C, PA-C  tiZANidine (ZANAFLEX) 4 MG capsule Take 4 mg by mouth 3 (three) times daily as needed for muscle spasms.   Yes [provider]  venlafaxine XR (EFFEXOR-XR) 150 MG 24 hr capsule Take 150 mg by mouth daily with breakfast.   Yes [provider]  albuterol (PROVENTIL HFA) 108 (90 Base) MCG/ACT inhaler Inhale into the  lungs. 03/10/17 03/10/18  [provider]  docusate sodium (COLACE) 100 MG capsule Take 100 mg 2 (two) times daily by mouth.    [provider]  ferrous sulfate 325 (65 FE) MG tablet Take 1 tablet (325 mg total) by mouth 2 (two) times daily with a meal. 10/18/17   Catheryn Bacon, CNM    Family History Family History  Problem Relation Age of Onset  . Arthritis Mother        Degenerative Disc Dz  . Obesity Mother   . Alcohol abuse Father   . Hyperlipidemia Father   . Heart disease Father        CAD - CABG age 62  . Kidney disease Maternal Grandmother   . Heart disease Maternal Grandmother   . Heart disease Maternal Grandfather   . Hyperlipidemia Maternal Grandfather   . Hypertension Maternal Grandfather     Social History Social History   Tobacco Use  . Smoking status: Former Smoker    Packs/day: 1.00    Years: 21.00    Pack years: 21.00    Quit date: 06/04/2016    Years since quitting: 4.3  . Smokeless tobacco: Never Used  Substance Use Topics  . Alcohol use: No    Alcohol/week: 0.0 standard drinks    Comment: 1-2 drinks twice a week/not during pregnancy  . Drug use: No     Allergies   Patient has no known allergies.   Review of Systems Review of Systems  Constitutional: Positive for chills. Negative for fever.  Respiratory: Negative for shortness of breath.   Cardiovascular: Negative for chest pain.  Gastrointestinal: Positive for nausea. Negative for abdominal pain, diarrhea and vomiting.  Genitourinary: Positive for dysuria and frequency. Negative for flank pain, genital sores, hematuria, menstrual problem, vaginal bleeding, vaginal discharge and vaginal pain.  Musculoskeletal: Negative for back pain.  Skin: Negative for rash.  Neurological: Negative for dizziness, light-headedness and headaches.     Physical Exam Triage Vital Signs ED Triage Vitals [10/06/20 1230]  Enc Vitals Group     BP 130/83     Pulse Rate 79     Resp 18     Temp  98.1 F (36.7 C)     Temp Source Oral     SpO2 97 %     Weight      Height      Head Circumference      Peak Flow      Pain Score 8     Pain Loc      Pain Edu?      Excl. in [place]?    No data found.  Updated Vital Signs BP 130/83 (BP Location: Left Arm)   Pulse 79   Temp 98.1 F (  36.7 C) (Oral)   Resp 18   LMP 09/29/2020   SpO2 97%   Breastfeeding No   Visual Acuity Right Eye Distance:   Left Eye Distance:   Bilateral Distance:    Right Eye Near:   Left Eye Near:    Bilateral Near:     Physical Exam Vitals and nursing note reviewed.  Constitutional:      Appearance: She is well-developed.     Comments: No acute distress  HENT:     Head: Normocephalic and atraumatic.     Nose: Nose normal.  Eyes:     Conjunctiva/sclera: Conjunctivae normal.  Cardiovascular:     Rate and Rhythm: Normal rate.  Pulmonary:     Effort: Pulmonary effort is normal. No respiratory distress.  Abdominal:     General: There is no distension.  Musculoskeletal:        General: Normal range of motion.     Cervical back: Neck supple.  Skin:    General: Skin is warm and dry.  Neurological:     Mental Status: She is alert and oriented to person, place, and time.      UC Treatments / Results  Labs (all labs ordered are listed, but only abnormal results are displayed) Labs Reviewed  POCT URINALYSIS DIP (MANUAL ENTRY) - Abnormal; Notable for the following components:      Result Value   Clarity, UA hazy (*)    Blood, UA trace-intact (*)    Nitrite, UA Positive (*)    Leukocytes, UA Trace (*)    All other components within normal limits  URINE CULTURE  POCT URINE PREGNANCY    EKG   Radiology No results found.  Procedures Procedures (including critical care time)  Medications Ordered in UC Medications - No data to display  Initial Impression / Assessment and Plan / UC Course  I have reviewed the triage vital signs and the nursing notes.  Pertinent labs & imaging  results that were available during my care of the patient were reviewed by me and considered in my medical decision making (see chart for details).     UA with trace leuks, positive nitrites, urine culture pending, treating for UTI with Keflex, will continue Pyridium as needed for dysuria.  Push fluids.  Discussed strict return precautions. Patient verbalized understanding and is agreeable with plan.  Final Clinical Impressions(s) / UC Diagnoses   Final diagnoses:  Lower urinary tract infection, acute     Discharge Instructions     Urine showed evidence of infection. We are treating you with keflex- twice daily for 1 week. Be sure to take full course. Stay hydrated- urine should be pale yellow to clear.   Please return or follow up with your primary provider if symptoms not improving with treatment. Please return sooner if you have worsening of symptoms or develop fever, nausea, vomiting, abdominal pain, back pain, lightheadedness, dizziness.    ED Prescriptions    Medication Sig Dispense Auth. Provider   cephALEXin (KEFLEX) 500 MG capsule Take 1 capsule (500 mg total) by mouth 2 (two) times daily for 7 days. 14 capsule Lenna Hagarty C, PA-C   phenazopyridine (PYRIDIUM) 200 MG tablet Take 1 tablet (200 mg total) by mouth 3 (three) times daily. 6 tablet Chalsey Leeth, [place] C, PA-C     PDMP not reviewed this encounter.   Janith Lima, [place] 10/06/20 1327

## 2020-10-06 NOTE — Discharge Instructions (Signed)
Urine showed evidence of infection. We are treating you with keflex- twice daily for 1 week. Be sure to take full course. Stay hydrated- urine should be pale yellow to clear.   Please return or follow up with your primary provider if symptoms not improving with treatment. Please return sooner if you have worsening of symptoms or develop fever, nausea, vomiting, abdominal pain, back pain, lightheadedness, dizziness.

## 2020-10-06 NOTE — ED Triage Notes (Signed)
Pt c/o urinary frequency, urgency, and burning on urination with bladder spasms x2 days.

## 2020-10-08 LAB — URINE CULTURE: Culture: >=100000 — AB

## 2020-11-05 MED FILL — EMGALITY PEN 120 MG/ML SUBCUTANEOUS PEN INJECTOR: SUBCUTANEOUS | 28 days supply | Qty: 1 | Fill #8

## 2020-11-05 MED FILL — VENLAFAXINE ER 150 MG CAPSULE,EXTENDED RELEASE 24 HR: ORAL | 90 days supply | Qty: 90 | Fill #2

## 2020-11-16 ENCOUNTER — Other Ambulatory Visit: Payer: Self-pay

## 2020-11-16 ENCOUNTER — Ambulatory Visit
Admission: RE | Admit: 2020-11-16 | Discharge: 2020-11-16 | Disposition: A | Discharge status: Home / Self Care | Payer: BC Managed Care – PPO | Source: Ambulatory Visit

## 2020-11-16 VITALS — BP 132/84 | HR 89 | Temp 98.3°F | Resp 18

## 2020-11-16 DIAGNOSIS — H6692 Otitis media, unspecified, left ear: Secondary | ICD-10-CM

## 2020-11-16 DIAGNOSIS — H7292 Unspecified perforation of tympanic membrane, left ear: Secondary | ICD-10-CM

## 2020-11-16 MED ORDER — AMOXICILLIN-POT CLAVULANATE 875-125 MG PO TABS: 1.0000 | ORAL_TABLET | Freq: Two times a day (BID) | ORAL | 0 refills | Status: DC

## 2020-11-16 NOTE — ED Triage Notes (Signed)
Patient c/o LFT sided ear pain x 1 day.   Patient states " I busted my ear drum yesterday while swimming".   Patient endorses ringing in ear and vertigo. Patient endorses sever pain today.   Patient put tea tree oil and ETOH with no relief of symptoms.

## 2020-11-16 NOTE — Discharge Instructions (Addendum)
Take the Augmentin as directed.  Follow up with an ENT.

## 2020-11-16 NOTE — ED Provider Notes (Signed)
Roderic Palau    CSN: [id number] Arrival date & time: 11/16/20  1446      History   Chief Complaint Chief Complaint  Patient presents with   Otalgia   1500 APPT    HPI Brooke Rush is a 45 y.o. female.  Patient presents with left ear pain x1 day.  She believes she ruptured her eardrum while swimming yesterday.  She reports ringing in her ear and dizziness.  Treatment at home with tea tree oil and rubbing alcohol.  She denies fever, chills, sore throat, cough, or other symptoms.  She reports history of ruptured tympanic membrane and has been seen by ENT.  Her medical history includes chronic back pain, fibromyalgia, fatigue, kidney stones, arthritis.  The history is provided by the patient and medical records.   Past Medical History:  Diagnosis Date   Arthritis    in her neck   Constipation    2/2 pain medication   Fibromyalgia    Diagnosed by Dr. Derrel Nip   GERD (gastroesophageal reflux disease)    Kidney stones    Migraines     Patient Active Problem List   Diagnosis Date Noted   Insulin controlled gestational diabetes mellitus (GDM) in third trimester 10/16/2017   Gestational diabetes 10/10/2017   NST (non-stress test) reactive 10/02/2017   Pregnancy 09/30/2017   Preterm premature rupture of membranes, antepartum 09/14/2017   Labor and delivery indication for care or intervention 07/13/2017   Advanced maternal age in multigravida, first trimester    Family history of SIDS (sudden infant death syndrome)    Supervision of high risk pregnancy in third trimester 03/27/2017   Chronic bilateral low back pain with bilateral sciatica 09/12/2016   Hip pain 09/12/2016   Thoracic outlet syndrome 06/03/2016   Anxiety 06/03/2016   Migraine 06/03/2016   Fibromyalgia 08/05/2013   Hypertriglyceridemia 08/05/2013   Fatigue 08/05/2013   Tobacco abuse 08/05/2013    Past Surgical History:  Procedure Laterality Date   CARPAL TUNNEL RELEASE Bilateral 1998    CHOLECYSTECTOMY  2011   ETHMOIDECTOMY Bilateral 12/02/2015   Procedure: LEFT TOTAL ETHMOIDECTOMY  RIGHT  REVISION TOTAL ETHMOIDECTOMY;  Surgeon: Carloyn Manner, MD;  Location: [place];  Service: ENT;  Laterality: Bilateral;   FRONTAL SINUS EXPLORATION Bilateral 12/02/2015   Procedure: FRONTAL SINUSOTOMY;  Surgeon: Carloyn Manner, MD;  Location: [place];  Service: ENT;  Laterality: Bilateral;   HEMORROIDECTOMY     Dr. Tamala Julian   IMAGE GUIDED SINUS SURGERY N/A 12/02/2015   Procedure: IMAGE GUIDED SINUS SURGERY;  Surgeon: Carloyn Manner, MD;  Location: [place];  Service: ENT;  Laterality: N/A;  GAVE DISK TO [facility] 6/22 DEE GEL FOAM PATCH STRYKER   LEEP     MAXILLARY ANTROSTOMY Left 12/02/2015   Procedure: MAXILLARY ANTROSTOMY;  Surgeon: Carloyn Manner, MD;  Location: [place];  Service: ENT;  Laterality: Left;   NASAL SEPTUM SURGERY  2011   [place]  2011   Dr. Pryor Ochoa   REMOVAL OF EAR TUBE Bilateral 12/02/2015   Procedure: Bilateral Tube removal with Gel Film Myringoplasty;  Surgeon: Carloyn Manner, MD;  Location: [place];  Service: ENT;  Laterality: Bilateral;   TONSILLECTOMY AND ADENOIDECTOMY  2013    OB History     Gravida  8   Para  4   Term  4   Preterm      AB  4   Living  3      SAB  4   IAB      Ectopic      Multiple  0   Live Births  4            Home Medications    Prior to Admission medications   Medication Sig Start Date End Date Taking? Authorizing Provider  amoxicillin-clavulanate (AUGMENTIN) 875-125 MG tablet Take 1 tablet by mouth every 12 (twelve) hours. 11/16/20  Yes Sharion Balloon, NP  docusate sodium (COLACE) 100 MG capsule Take 100 mg 2 (two) times daily by mouth.   Yes [provider]  QUEtiapine (SEROQUEL) 50 MG tablet Take 50 mg by mouth. 06/08/20 06/08/21 Yes [provider]  tiZANidine (ZANAFLEX) 4 MG capsule Take 4 mg by mouth 3 (three) times daily as  needed for muscle spasms.   Yes [provider]  venlafaxine XR (EFFEXOR-XR) 150 MG 24 hr capsule Take 150 mg by mouth daily with breakfast.   Yes [provider]  albuterol (PROVENTIL HFA) 108 (90 Base) MCG/ACT inhaler Inhale into the lungs. 03/10/17 03/10/18  [provider]  ferrous sulfate 325 (65 FE) MG tablet Take 1 tablet (325 mg total) by mouth 2 (two) times daily with a meal. 10/18/17   Catheryn Bacon, CNM  phenazopyridine (PYRIDIUM) 200 MG tablet Take 1 tablet (200 mg total) by mouth 3 (three) times daily. 10/06/20   Wieters, Elesa Hacker, PA-C    Family History Family History  Problem Relation Age of Onset   Arthritis Mother        Degenerative Disc Dz   Obesity Mother    Alcohol abuse Father    Hyperlipidemia Father    Heart disease Father        CAD - CABG age 17   Kidney disease Maternal Grandmother    Heart disease Maternal Grandmother    Heart disease Maternal Grandfather    Hyperlipidemia Maternal Grandfather    Hypertension Maternal Grandfather     Social History Social History   Tobacco Use   Smoking status: Former    Packs/day: 1.00    Years: 21.00    Pack years: 21.00    Types: Cigarettes    Quit date: 06/04/2016    Years since quitting: 4.4   Smokeless tobacco: Never  Substance Use Topics   Alcohol use: No    Alcohol/week: 0.0 standard drinks    Comment: 1-2 drinks twice a week/not during pregnancy   Drug use: No     Allergies   Latex, Other, and Nsaids   Review of Systems Review of Systems  Constitutional:  Negative for chills and fever.  HENT:  Positive for ear pain. Negative for sore throat.   Respiratory:  Negative for cough and shortness of breath.   Cardiovascular:  Negative for chest pain and palpitations.  Gastrointestinal:  Negative for abdominal pain and vomiting.  Skin:  Negative for color change and rash.  All other systems reviewed and are negative.   Physical Exam Triage Vital Signs ED Triage Vitals   Enc Vitals Group     BP      Pulse      Resp      Temp      Temp src      SpO2      Weight      Height      Head Circumference      Peak Flow      Pain Score      Pain Loc  Pain Edu?      Excl. in [place]?    No data found.  Updated Vital Signs BP 132/84 (BP Location: Left Arm)   Pulse 89   Temp 98.3 F (36.8 C) (Oral)   Resp 18   LMP 10/16/2020 (Approximate)   SpO2 98%   Visual Acuity Right Eye Distance:   Left Eye Distance:   Bilateral Distance:    Right Eye Near:   Left Eye Near:    Bilateral Near:     Physical Exam Vitals and nursing note reviewed.  Constitutional:      General: She is not in acute distress.    Appearance: She is well-developed.  HENT:     Head: Normocephalic and atraumatic.     Right Ear: Tympanic membrane and ear canal normal.     Left Ear: Ear canal normal. Tympanic membrane is perforated and erythematous.     Ears:      Nose: Nose normal.     Mouth/Throat:     Mouth: Mucous membranes are moist.     Pharynx: Oropharynx is clear.  Eyes:     Conjunctiva/sclera: Conjunctivae normal.  Cardiovascular:     Rate and Rhythm: Normal rate and regular rhythm.     Heart sounds: Normal heart sounds.  Pulmonary:     Effort: Pulmonary effort is normal. No respiratory distress.     Breath sounds: Normal breath sounds.  Abdominal:     Palpations: Abdomen is soft.     Tenderness: There is no abdominal tenderness.  Musculoskeletal:     Cervical back: Neck supple.  Skin:    General: Skin is warm and dry.  Neurological:     General: No focal deficit present.     Mental Status: She is alert and oriented to person, place, and time.     Gait: Gait normal.  Psychiatric:        Mood and Affect: Mood normal.        Behavior: Behavior normal.     UC Treatments / Results  Labs (all labs ordered are listed, but only abnormal results are displayed) Labs Reviewed - No data to display  EKG   Radiology No results  found.  Procedures Procedures (including critical care time)  Medications Ordered in UC Medications - No data to display  Initial Impression / Assessment and Plan / UC Course  I have reviewed the triage vital signs and the nursing notes.  Pertinent labs & imaging results that were available during my care of the patient were reviewed by me and considered in my medical decision making (see chart for details).  Ruptured left tympanic membrane, left otitis media.  Cautioned patient not to put oils or rubbing alcohol in her ear.  Education provided on rupture of eardrum and ear infections.  Treating with Augmentin.  Instructed her to follow-up with her ENT; she reports this is Dr. Pryor Ochoa at [facility].  She agrees to plan of care.   Final Clinical Impressions(s) / UC Diagnoses   Final diagnoses:  Perforation of left tympanic membrane  Left otitis media, unspecified otitis media type     Discharge Instructions      Take the Augmentin as directed.  Follow up with an ENT.        ED Prescriptions     Medication Sig Dispense Auth. Provider   amoxicillin-clavulanate (AUGMENTIN) 875-125 MG tablet Take 1 tablet by mouth every 12 (twelve) hours. 14 tablet Sharion Balloon, NP  I have reviewed the PDMP during this encounter.   Sharion Balloon, NP 11/16/20 [phone]

## 2020-12-08 MED FILL — EMGALITY PEN 120 MG/ML SUBCUTANEOUS PEN INJECTOR: SUBCUTANEOUS | 28 days supply | Qty: 1 | Fill #9

## 2020-12-10 ENCOUNTER — Emergency Department: Payer: BC Managed Care – PPO

## 2020-12-10 ENCOUNTER — Other Ambulatory Visit: Payer: Self-pay

## 2020-12-10 ENCOUNTER — Emergency Department: Payer: Self-pay

## 2020-12-10 ENCOUNTER — Emergency Department
Admission: EM | Admit: 2020-12-10 | Discharge: 2020-12-10 | Disposition: A | Discharge status: Home / Self Care | Payer: BC Managed Care – PPO | Attending: Emergency Medicine | Admitting: Emergency Medicine

## 2020-12-10 DIAGNOSIS — Y9301 Activity, walking, marching and hiking: Secondary | ICD-10-CM | POA: Insufficient documentation

## 2020-12-10 DIAGNOSIS — S59902A Unspecified injury of left elbow, initial encounter: Secondary | ICD-10-CM | POA: Diagnosis present

## 2020-12-10 DIAGNOSIS — W010XXA Fall on same level from slipping, tripping and stumbling without subsequent striking against object, initial encounter: Secondary | ICD-10-CM | POA: Diagnosis not present

## 2020-12-10 DIAGNOSIS — Z87891 Personal history of nicotine dependence: Secondary | ICD-10-CM | POA: Insufficient documentation

## 2020-12-10 DIAGNOSIS — Z91040 Latex allergy status: Secondary | ICD-10-CM | POA: Diagnosis not present

## 2020-12-10 DIAGNOSIS — S53105A Unspecified dislocation of left ulnohumeral joint, initial encounter: Secondary | ICD-10-CM | POA: Diagnosis not present

## 2020-12-10 MED ORDER — PROPOFOL 10 MG/ML IV BOLUS
1.0000 mg/kg | Freq: Once | INTRAVENOUS | Status: DC
  Filled 2020-12-10: qty 20

## 2020-12-10 MED ORDER — MORPHINE SULFATE (PF) 4 MG/ML IV SOLN
4.0000 mg | Freq: Once | INTRAVENOUS | Status: AC
  Administered 2020-12-10: 4 mg via INTRAVENOUS
  Filled 2020-12-10: qty 1

## 2020-12-10 NOTE — ED Notes (Signed)
Patient transported to CT 

## 2020-12-10 NOTE — ED Triage Notes (Signed)
Pt was sent from [facility] for dislocated left elbow, pt states she tripped  and fell today

## 2020-12-10 NOTE — ED Notes (Signed)
Pt with left arm elevated. Pt in NAD at this time. Pt conversing freely

## 2020-12-10 NOTE — ED Provider Notes (Signed)
[facility] Emergency Department Provider Note   ____________________________________________   Event Date/Time   First MD Initiated Contact with Patient 12/10/20 1501     (approximate)  I have reviewed the triage vital signs and the nursing notes.   HISTORY  Chief Complaint Elbow Injury    HPI Brooke Rush is a 45 y.o. female with past medical history of hyperlipidemia, GERD, and fibromyalgia who presents to the ED complaining of elbow pain.  Patient reports that approximately 2 hours prior to arrival she was walking when her ankle rolled and she fell onto her outstretched right arm.  She denies hitting her head or losing consciousness, but had immediate onset of pain at her left elbow with obvious deformity.  She complains of sharp and burning pain that starts in her left elbow and radiates down towards her hand.  She denies any difficulty moving her left hand or wrist.  She denies any pain in her left wrist or shoulder, does not have any injuries to her right arm.  She was initially evaluated at [facility], where they attempted reduction but were unsuccessful.  She arrives with disc containing x-ray images from [facility].        Past Medical History:  Diagnosis Date   Arthritis    in her neck   Constipation    2/2 pain medication   Fibromyalgia    Diagnosed by Dr. Derrel Nip   GERD (gastroesophageal reflux disease)    Kidney stones    Migraines     Patient Active Problem List   Diagnosis Date Noted   Insulin controlled gestational diabetes mellitus (GDM) in third trimester 10/16/2017   Gestational diabetes 10/10/2017   NST (non-stress test) reactive 10/02/2017   Pregnancy 09/30/2017   Preterm premature rupture of membranes, antepartum 09/14/2017   Labor and delivery indication for care or intervention 07/13/2017   Advanced maternal age in multigravida, first trimester    Family history of SIDS (sudden infant death syndrome)     Supervision of high risk pregnancy in third trimester 03/27/2017   Chronic bilateral low back pain with bilateral sciatica 09/12/2016   Hip pain 09/12/2016   Thoracic outlet syndrome 06/03/2016   Anxiety 06/03/2016   Migraine 06/03/2016   Fibromyalgia 08/05/2013   Hypertriglyceridemia 08/05/2013   Fatigue 08/05/2013   Tobacco abuse 08/05/2013    Past Surgical History:  Procedure Laterality Date   CARPAL TUNNEL RELEASE Bilateral 1998   CHOLECYSTECTOMY  2011   ETHMOIDECTOMY Bilateral 12/02/2015   Procedure: LEFT TOTAL ETHMOIDECTOMY  RIGHT  REVISION TOTAL ETHMOIDECTOMY;  Surgeon: Carloyn Manner, MD;  Location: [place];  Service: ENT;  Laterality: Bilateral;   FRONTAL SINUS EXPLORATION Bilateral 12/02/2015   Procedure: FRONTAL SINUSOTOMY;  Surgeon: Carloyn Manner, MD;  Location: [place];  Service: ENT;  Laterality: Bilateral;   HEMORROIDECTOMY     Dr. Tamala Julian   IMAGE GUIDED SINUS SURGERY N/A 12/02/2015   Procedure: IMAGE GUIDED SINUS SURGERY;  Surgeon: Carloyn Manner, MD;  Location: [place];  Service: ENT;  Laterality: N/A;  GAVE DISK TO [facility] 6/22 DEE GEL FOAM PATCH STRYKER   LEEP     MAXILLARY ANTROSTOMY Left 12/02/2015   Procedure: MAXILLARY ANTROSTOMY;  Surgeon: Carloyn Manner, MD;  Location: [place];  Service: ENT;  Laterality: Left;   NASAL SEPTUM SURGERY  2011   [place]  2011   Dr. Pryor Ochoa   REMOVAL OF EAR TUBE Bilateral 12/02/2015   Procedure: Bilateral Tube removal with  Gel Film Myringoplasty;  Surgeon: Carloyn Manner, MD;  Location: [place];  Service: ENT;  Laterality: Bilateral;   TONSILLECTOMY AND ADENOIDECTOMY  2013    Prior to Admission medications   Medication Sig Start Date End Date Taking? Authorizing Provider  albuterol (PROVENTIL HFA) 108 (90 Base) MCG/ACT inhaler Inhale into the lungs. 03/10/17 03/10/18  [provider]  amoxicillin-clavulanate (AUGMENTIN) 875-125 MG tablet Take 1  tablet by mouth every 12 (twelve) hours. 11/16/20   Sharion Balloon, NP  docusate sodium (COLACE) 100 MG capsule Take 100 mg 2 (two) times daily by mouth.    [provider]  ferrous sulfate 325 (65 FE) MG tablet Take 1 tablet (325 mg total) by mouth 2 (two) times daily with a meal. 10/18/17   Catheryn Bacon, CNM  phenazopyridine (PYRIDIUM) 200 MG tablet Take 1 tablet (200 mg total) by mouth 3 (three) times daily. 10/06/20   Wieters, Hallie C, PA-C  QUEtiapine (SEROQUEL) 50 MG tablet Take 50 mg by mouth. 06/08/20 06/08/21  [provider]  tiZANidine (ZANAFLEX) 4 MG capsule Take 4 mg by mouth 3 (three) times daily as needed for muscle spasms.    [provider]  venlafaxine XR (EFFEXOR-XR) 150 MG 24 hr capsule Take 150 mg by mouth daily with breakfast.    [provider]    Allergies Latex, Other, and Nsaids  Family History  Problem Relation Age of Onset   Arthritis Mother        Degenerative Disc Dz   Obesity Mother    Alcohol abuse Father    Hyperlipidemia Father    Heart disease Father        CAD - CABG age 52   Kidney disease Maternal Grandmother    Heart disease Maternal Grandmother    Heart disease Maternal Grandfather    Hyperlipidemia Maternal Grandfather    Hypertension Maternal Grandfather     Social History Social History   Tobacco Use   Smoking status: Former    Packs/day: 1.00    Years: 21.00    Pack years: 21.00    Types: Cigarettes    Quit date: 06/04/2016    Years since quitting: 4.5   Smokeless tobacco: Never  Substance Use Topics   Alcohol use: No    Alcohol/week: 0.0 standard drinks    Comment: 1-2 drinks twice a week/not during pregnancy   Drug use: No    Review of Systems  Constitutional: No fever/chills Eyes: No visual changes. ENT: No sore throat. Cardiovascular: Denies chest pain. Respiratory: Denies shortness of breath. Gastrointestinal: No abdominal pain.  No nausea, no vomiting.  No diarrhea.  No  constipation. Genitourinary: Negative for dysuria. Musculoskeletal: Negative for back pain.  Positive for left elbow pain. Skin: Negative for rash. Neurological: Negative for headaches, focal weakness or numbness.  ____________________________________________   PHYSICAL EXAM:  VITAL SIGNS: ED Triage Vitals  Enc Vitals Group     BP 12/10/20 1437 139/81     Pulse Rate 12/10/20 1437 (!) 102     Resp 12/10/20 1437 16     Temp 12/10/20 1437 98.8 F (37.1 C)     Temp Source 12/10/20 1437 Oral     SpO2 12/10/20 1437 97 %     Weight 12/10/20 1416 215 lb (97.5 kg)     Height 12/10/20 1416 5\' 4"  (1.626 m)     Head Circumference --      Peak Flow --      Pain Score 12/10/20 1416  10     Pain Loc --      Pain Edu? --      Excl. in [place]? --     Constitutional: Alert and oriented. Eyes: Conjunctivae are normal. Head: Atraumatic. Nose: No congestion/rhinnorhea. Mouth/Throat: Mucous membranes are moist. Neck: Normal ROM Cardiovascular: Normal rate, regular rhythm. Grossly normal heart sounds. Respiratory: Normal respiratory effort.  No retractions. Lungs CTAB. Gastrointestinal: Soft and nontender. No distention. Genitourinary: deferred Musculoskeletal: Obvious deformity to left elbow with associated diffuse tenderness palpation.  No left wrist or shoulder tenderness to palpation, range of motion intact without pain.  2+ radial pulses bilaterally, cap refill less than 2 seconds in left digits.  No lower extremity tenderness nor edema. Neurologic:  Normal speech and language. No gross focal neurologic deficits are appreciated. Skin:  Skin is warm, dry and intact. No rash noted. Psychiatric: Mood and affect are normal. Speech and behavior are normal.  ____________________________________________   LABS (all labs ordered are listed, but only abnormal results are displayed)  Labs Reviewed - No data to display   PROCEDURES  Procedure(s) performed (including Critical  Care):  Procedures   ____________________________________________   INITIAL IMPRESSION / ASSESSMENT AND PLAN / ED COURSE      45 year old female with past medical history of hyperlipidemia, GERD, fibromyalgia presents to the ED with fall and injury to her left elbow, diagnosed with dislocation at [facility] prior to arrival.  We will have images from [facility] uploaded into our system.  She is neurovascularly intact to her distal left upper extremity and there are no other apparent injuries.  Plan to discuss with orthopedics and attempt reduction here in the ED with the assistance of sedation.  Patient now states that she feels like elbow has reduced on its own after she had dangled it off the side of the bed.  Follow-up x-ray reviewed by me shows appropriate reduction of prior dislocation.  Per radiology, there is questionable fracture at her distal humerus.  This was further assessed with CT scan which is negative for fracture or dislocation.  Patient is appropriate for discharge home with orthopedics follow-up, she was placed in a posterior long-arm splint.  She was counseled to return to the ED for new worsening symptoms, patient agrees with plan.      ____________________________________________   FINAL CLINICAL IMPRESSION(S) / ED DIAGNOSES  Final diagnoses:  Dislocation of left elbow, initial encounter     ED Discharge Orders     None        Note:  This document was prepared using Dragon voice recognition software and may include unintentional dictation errors.    Blake Divine, MD 12/10/20 [phone]

## 2020-12-23 DIAGNOSIS — F4310 Post-traumatic stress disorder, unspecified: Principal | ICD-10-CM

## 2020-12-23 DIAGNOSIS — G540 Brachial plexus disorders: Principal | ICD-10-CM

## 2020-12-23 DIAGNOSIS — J0111 Acute recurrent frontal sinusitis: Principal | ICD-10-CM

## 2020-12-23 MED ORDER — HYDROCODONE 5 MG-ACETAMINOPHEN 325 MG TABLET
ORAL_TABLET | Freq: Every day | ORAL | 0 refills | 30 days | PRN
Start: 2020-12-23 — End: ?

## 2020-12-23 MED ORDER — METHYLPREDNISOLONE 4 MG TABLETS IN A DOSE PACK
0 refills | 0 days
Start: 2020-12-23 — End: ?

## 2020-12-24 MED ORDER — LORAZEPAM 1 MG TABLET
ORAL_TABLET | Freq: Every day | ORAL | 0 refills | 30.00000 days | PRN
Start: 2020-12-24 — End: ?

## 2020-12-29 MED FILL — EMGALITY PEN 120 MG/ML SUBCUTANEOUS PEN INJECTOR: SUBCUTANEOUS | 28 days supply | Qty: 1 | Fill #10

## 2021-01-12 ENCOUNTER — Encounter
Admit: 2021-01-12 | Discharge: 2021-01-13 | Payer: PRIVATE HEALTH INSURANCE | Attending: Family Medicine | Primary: Family Medicine

## 2021-01-12 DIAGNOSIS — Z1231 Encounter for screening mammogram for malignant neoplasm of breast: Principal | ICD-10-CM

## 2021-01-12 DIAGNOSIS — Z0000 Encounter for general adult medical examination without abnormal findings: Principal | ICD-10-CM

## 2021-01-12 DIAGNOSIS — F4310 Post-traumatic stress disorder, unspecified: Principal | ICD-10-CM

## 2021-01-12 DIAGNOSIS — G540 Brachial plexus disorders: Principal | ICD-10-CM

## 2021-01-12 MED ORDER — HYDROCODONE 5 MG-ACETAMINOPHEN 325 MG TABLET
ORAL_TABLET | Freq: Every day | ORAL | 0 refills | 30.00000 days | Status: CP | PRN
Start: 2021-01-12 — End: ?

## 2021-01-12 MED ORDER — LORAZEPAM 1 MG TABLET
ORAL_TABLET | Freq: Every day | ORAL | 0 refills | 30.00000 days | Status: CP | PRN
Start: 2021-01-12 — End: ?

## 2021-01-12 MED ORDER — VENLAFAXINE ER 37.5 MG CAPSULE,EXTENDED RELEASE 24 HR
ORAL_CAPSULE | Freq: Every day | ORAL | 0 refills | 30 days | Status: CP
Start: 2021-01-12 — End: ?

## 2021-01-27 ENCOUNTER — Encounter
Admit: 2021-01-27 | Discharge: 2021-01-28 | Payer: PRIVATE HEALTH INSURANCE | Attending: Family Medicine | Primary: Family Medicine

## 2021-02-01 DIAGNOSIS — G43801 Other migraine, not intractable, with status migrainosus: Principal | ICD-10-CM

## 2021-02-01 MED ORDER — EMGALITY PEN 120 MG/ML SUBCUTANEOUS PEN INJECTOR
SUBCUTANEOUS | 3 refills | 84 days | Status: CP
Start: 2021-02-01 — End: 2022-02-01
  Filled 2021-02-04: qty 3, 84d supply, fill #0

## 2021-02-19 DIAGNOSIS — G43801 Other migraine, not intractable, with status migrainosus: Principal | ICD-10-CM

## 2021-02-19 MED ORDER — QUETIAPINE 50 MG TABLET
ORAL_TABLET | Freq: Every evening | ORAL | 0 refills | 60.00000 days | Status: CN | PRN
Start: 2021-02-19 — End: 2022-02-19

## 2021-03-05 DIAGNOSIS — G540 Brachial plexus disorders: Principal | ICD-10-CM

## 2021-03-05 DIAGNOSIS — F4310 Post-traumatic stress disorder, unspecified: Principal | ICD-10-CM

## 2021-03-05 MED ORDER — VENLAFAXINE ER 37.5 MG CAPSULE,EXTENDED RELEASE 24 HR
ORAL_CAPSULE | Freq: Every day | ORAL | 0 refills | 30.00000 days
Start: 2021-03-05 — End: ?

## 2021-03-05 MED ORDER — HYDROCODONE 5 MG-ACETAMINOPHEN 325 MG TABLET
ORAL_TABLET | Freq: Every day | ORAL | 0 refills | 30.00000 days | PRN
Start: 2021-03-05 — End: ?

## 2021-03-05 MED ORDER — LORAZEPAM 1 MG TABLET
ORAL_TABLET | Freq: Every day | ORAL | 0 refills | 30 days | PRN
Start: 2021-03-05 — End: ?

## 2021-03-08 MED ORDER — LORAZEPAM 1 MG TABLET
ORAL_TABLET | Freq: Every day | ORAL | 0 refills | 90 days | Status: CP | PRN
Start: 2021-03-08 — End: 2021-06-06

## 2021-03-08 MED ORDER — VENLAFAXINE ER 37.5 MG CAPSULE,EXTENDED RELEASE 24 HR
ORAL_CAPSULE | Freq: Every day | ORAL | 1 refills | 30 days | Status: CP
Start: 2021-03-08 — End: ?

## 2021-03-08 MED ORDER — HYDROCODONE 5 MG-ACETAMINOPHEN 325 MG TABLET
ORAL_TABLET | Freq: Every day | ORAL | 0 refills | 30 days | Status: CP | PRN
Start: 2021-03-08 — End: ?

## 2021-04-13 ENCOUNTER — Ambulatory Visit: Admit: 2021-04-13 | Discharge: 2021-04-14 | Payer: PRIVATE HEALTH INSURANCE

## 2021-04-13 DIAGNOSIS — Z1231 Encounter for screening mammogram for malignant neoplasm of breast: Principal | ICD-10-CM

## 2021-04-14 DIAGNOSIS — Z1231 Encounter for screening mammogram for malignant neoplasm of breast: Principal | ICD-10-CM

## 2021-04-23 ENCOUNTER — Ambulatory Visit: Admit: 2021-04-23 | Discharge: 2021-04-24 | Payer: PRIVATE HEALTH INSURANCE

## 2021-04-27 DIAGNOSIS — F4310 Post-traumatic stress disorder, unspecified: Principal | ICD-10-CM

## 2021-04-27 DIAGNOSIS — G540 Brachial plexus disorders: Principal | ICD-10-CM

## 2021-04-27 MED ORDER — HYDROCODONE 5 MG-ACETAMINOPHEN 325 MG TABLET
ORAL_TABLET | Freq: Every day | ORAL | 0 refills | 30 days | PRN
Start: 2021-04-27 — End: ?

## 2021-04-27 MED ORDER — LORAZEPAM 1 MG TABLET
ORAL_TABLET | Freq: Every day | ORAL | 0 refills | 90 days | PRN
Start: 2021-04-27 — End: 2021-07-26

## 2021-04-28 MED ORDER — LORAZEPAM 1 MG TABLET
ORAL_TABLET | Freq: Every day | ORAL | 0 refills | 90 days | Status: CP | PRN
Start: 2021-04-28 — End: 2021-07-27

## 2021-04-28 MED ORDER — HYDROCODONE 5 MG-ACETAMINOPHEN 325 MG TABLET
ORAL_TABLET | Freq: Every day | ORAL | 0 refills | 30 days | Status: CP | PRN
Start: 2021-04-28 — End: ?

## 2021-05-19 DIAGNOSIS — F4310 Post-traumatic stress disorder, unspecified: Principal | ICD-10-CM

## 2021-05-19 MED ORDER — VENLAFAXINE ER 37.5 MG CAPSULE,EXTENDED RELEASE 24 HR
ORAL_CAPSULE | 1 refills | 0 days | Status: CP
Start: 2021-05-19 — End: ?

## 2021-06-08 ENCOUNTER — Telehealth
Admit: 2021-06-08 | Discharge: 2021-06-09 | Payer: PRIVATE HEALTH INSURANCE | Attending: Family Medicine | Primary: Family Medicine

## 2021-06-08 DIAGNOSIS — J0111 Acute recurrent frontal sinusitis: Principal | ICD-10-CM

## 2021-06-08 MED ORDER — METHYLPREDNISOLONE 4 MG TABLETS IN A DOSE PACK
0 refills | 0 days | Status: CP
Start: 2021-06-08 — End: ?

## 2021-06-08 MED ORDER — BENZONATATE 100 MG CAPSULE
ORAL_CAPSULE | Freq: Four times a day (QID) | ORAL | 1 refills | 8 days | Status: CP | PRN
Start: 2021-06-08 — End: 2022-06-08

## 2021-06-08 MED ORDER — AMOXICILLIN 875 MG-POTASSIUM CLAVULANATE 125 MG TABLET
ORAL_TABLET | Freq: Two times a day (BID) | ORAL | 0 refills | 7 days | Status: CP
Start: 2021-06-08 — End: 2021-06-15

## 2021-06-14 DIAGNOSIS — G540 Brachial plexus disorders: Principal | ICD-10-CM

## 2021-06-14 MED ORDER — FLUCONAZOLE 150 MG TABLET
ORAL_TABLET | Freq: Once | ORAL | 0 refills | 1 days | Status: CP
Start: 2021-06-14 — End: 2021-06-14

## 2021-06-14 MED ORDER — HYDROCODONE 5 MG-ACETAMINOPHEN 325 MG TABLET
ORAL_TABLET | Freq: Every day | ORAL | 0 refills | 30.00000 days | Status: CP | PRN
Start: 2021-06-14 — End: ?

## 2021-07-06 ENCOUNTER — Ambulatory Visit
Admit: 2021-07-06 | Discharge: 2021-07-07 | Payer: PRIVATE HEALTH INSURANCE | Attending: Family Medicine | Primary: Family Medicine

## 2021-07-12 DIAGNOSIS — G540 Brachial plexus disorders: Principal | ICD-10-CM

## 2021-07-12 DIAGNOSIS — F4310 Post-traumatic stress disorder, unspecified: Principal | ICD-10-CM

## 2021-07-12 MED ORDER — HYDROCODONE 5 MG-ACETAMINOPHEN 325 MG TABLET
ORAL_TABLET | Freq: Every day | ORAL | 0 refills | 30.00000 days | Status: CP | PRN
Start: 2021-07-12 — End: ?

## 2021-07-12 MED ORDER — LORAZEPAM 1 MG TABLET
ORAL_TABLET | Freq: Every day | ORAL | 0 refills | 90 days | Status: CP | PRN
Start: 2021-07-12 — End: 2021-10-10

## 2021-07-13 DIAGNOSIS — G43801 Other migraine, not intractable, with status migrainosus: Principal | ICD-10-CM

## 2021-07-13 MED ORDER — QUETIAPINE 50 MG TABLET
ORAL_TABLET | Freq: Every evening | ORAL | 11 refills | 30.00000 days | Status: CP | PRN
Start: 2021-07-13 — End: 2022-07-13

## 2021-07-16 DIAGNOSIS — G43801 Other migraine, not intractable, with status migrainosus: Principal | ICD-10-CM

## 2021-07-16 MED ORDER — QUETIAPINE 50 MG TABLET
ORAL_TABLET | Freq: Every evening | ORAL | 4 refills | 0.00000 days | PRN
Start: 2021-07-16 — End: ?

## 2021-07-19 DIAGNOSIS — G43801 Other migraine, not intractable, with status migrainosus: Principal | ICD-10-CM

## 2021-07-19 DIAGNOSIS — G540 Brachial plexus disorders: Principal | ICD-10-CM

## 2021-07-19 DIAGNOSIS — F4310 Post-traumatic stress disorder, unspecified: Principal | ICD-10-CM

## 2021-07-19 MED ORDER — QUETIAPINE 50 MG TABLET
ORAL_TABLET | Freq: Every evening | ORAL | 11 refills | 30.00000 days | PRN
Start: 2021-07-19 — End: 2022-07-19

## 2021-07-19 MED ORDER — HYDROCODONE 5 MG-ACETAMINOPHEN 325 MG TABLET
ORAL_TABLET | Freq: Every day | ORAL | 0 refills | 30.00000 days | PRN
Start: 2021-07-19 — End: ?

## 2021-07-19 MED ORDER — LORAZEPAM 1 MG TABLET
ORAL_TABLET | Freq: Every day | ORAL | 0 refills | 90 days | PRN
Start: 2021-07-19 — End: 2021-10-17

## 2021-07-21 DIAGNOSIS — G43801 Other migraine, not intractable, with status migrainosus: Principal | ICD-10-CM

## 2021-07-21 DIAGNOSIS — F4310 Post-traumatic stress disorder, unspecified: Principal | ICD-10-CM

## 2021-07-21 DIAGNOSIS — G540 Brachial plexus disorders: Principal | ICD-10-CM

## 2021-07-21 MED ORDER — QUETIAPINE 50 MG TABLET
ORAL_TABLET | Freq: Every evening | ORAL | 11 refills | 30 days | PRN
Start: 2021-07-21 — End: 2022-07-21

## 2021-07-21 MED ORDER — HYDROCODONE 5 MG-ACETAMINOPHEN 325 MG TABLET
ORAL_TABLET | Freq: Every day | ORAL | 0 refills | 30 days | PRN
Start: 2021-07-21 — End: ?

## 2021-07-21 MED ORDER — LORAZEPAM 1 MG TABLET
ORAL_TABLET | Freq: Every day | ORAL | 0 refills | 90 days | PRN
Start: 2021-07-21 — End: 2021-10-19

## 2021-07-22 MED ORDER — QUETIAPINE 50 MG TABLET
ORAL_TABLET | Freq: Every evening | ORAL | 11 refills | 30 days | Status: CP | PRN
Start: 2021-07-22 — End: 2022-07-22

## 2021-07-22 MED ORDER — LORAZEPAM 1 MG TABLET
ORAL_TABLET | Freq: Every day | ORAL | 0 refills | 90 days | Status: CP | PRN
Start: 2021-07-22 — End: 2021-10-20

## 2021-07-22 MED ORDER — HYDROCODONE 5 MG-ACETAMINOPHEN 325 MG TABLET
ORAL_TABLET | Freq: Every day | ORAL | 0 refills | 30 days | Status: CP | PRN
Start: 2021-07-22 — End: ?

## 2021-08-02 DIAGNOSIS — K649 Unspecified hemorrhoids: Principal | ICD-10-CM

## 2021-08-02 MED ORDER — TUCKS (WITCH HAZEL) 50 % TOPICAL PADS
TOPICAL | 0 refills | 9 days | Status: CP | PRN
Start: 2021-08-02 — End: ?

## 2021-08-02 MED ORDER — HYDROCORTISONE 2.5 % TOPICAL OINTMENT
Freq: Two times a day (BID) | TOPICAL | 1 refills | 0 days | Status: CP
Start: 2021-08-02 — End: ?

## 2021-08-03 ENCOUNTER — Institutional Professional Consult (permissible substitution)
Admit: 2021-08-03 | Discharge: 2021-08-03 | Payer: PRIVATE HEALTH INSURANCE | Attending: Family Medicine | Primary: Family Medicine

## 2021-08-03 MED ORDER — DOXYCYCLINE HYCLATE 100 MG CAPSULE
ORAL_CAPSULE | Freq: Two times a day (BID) | ORAL | 0 refills | 5 days | Status: CP
Start: 2021-08-03 — End: 2021-08-08

## 2021-08-03 MED ORDER — PREDNISONE 20 MG TABLET
ORAL_TABLET | Freq: Every day | ORAL | 0 refills | 5 days | Status: CP
Start: 2021-08-03 — End: 2021-08-08

## 2021-08-04 ENCOUNTER — Ambulatory Visit
Admit: 2021-08-04 | Discharge: 2021-08-05 | Payer: PRIVATE HEALTH INSURANCE | Attending: Family Medicine | Primary: Family Medicine

## 2021-08-13 DIAGNOSIS — G540 Brachial plexus disorders: Principal | ICD-10-CM

## 2021-08-13 DIAGNOSIS — F4310 Post-traumatic stress disorder, unspecified: Principal | ICD-10-CM

## 2021-08-13 MED ORDER — LORAZEPAM 1 MG TABLET
ORAL_TABLET | Freq: Every day | ORAL | 0 refills | 90 days | Status: CP | PRN
Start: 2021-08-13 — End: 2021-11-11

## 2021-08-23 MED ORDER — HYDROCODONE 5 MG-ACETAMINOPHEN 325 MG TABLET
ORAL_TABLET | Freq: Every day | ORAL | 0 refills | 40 days | Status: CP | PRN
Start: 2021-08-23 — End: ?

## 2021-09-21 DIAGNOSIS — G540 Brachial plexus disorders: Principal | ICD-10-CM

## 2021-09-21 MED ORDER — HYDROCODONE 5 MG-ACETAMINOPHEN 325 MG TABLET
ORAL_TABLET | Freq: Every day | ORAL | 0 refills | 40 days | PRN
Start: 2021-09-21 — End: ?

## 2021-10-01 MED ORDER — HYDROCODONE 5 MG-ACETAMINOPHEN 325 MG TABLET
ORAL_TABLET | Freq: Every day | ORAL | 0 refills | 40 days | Status: CP | PRN
Start: 2021-10-01 — End: ?

## 2021-12-08 DIAGNOSIS — G540 Brachial plexus disorders: Principal | ICD-10-CM

## 2021-12-08 DIAGNOSIS — G43801 Other migraine, not intractable, with status migrainosus: Principal | ICD-10-CM

## 2021-12-08 MED ORDER — QUETIAPINE 50 MG TABLET
ORAL_TABLET | Freq: Every evening | ORAL | 11 refills | 30 days | PRN
Start: 2021-12-08 — End: 2022-12-08

## 2021-12-08 MED ORDER — HYDROCODONE 5 MG-ACETAMINOPHEN 325 MG TABLET
ORAL_TABLET | Freq: Every day | ORAL | 0 refills | 40 days | PRN
Start: 2021-12-08 — End: ?

## 2021-12-09 MED ORDER — HYDROCODONE 5 MG-ACETAMINOPHEN 325 MG TABLET
ORAL_TABLET | Freq: Every day | ORAL | 0 refills | 40 days | Status: CP | PRN
Start: 2021-12-09 — End: ?

## 2022-01-25 DIAGNOSIS — G43801 Other migraine, not intractable, with status migrainosus: Principal | ICD-10-CM

## 2022-01-25 DIAGNOSIS — G540 Brachial plexus disorders: Principal | ICD-10-CM

## 2022-01-25 MED ORDER — QUETIAPINE 50 MG TABLET
ORAL_TABLET | Freq: Every evening | ORAL | 11 refills | 30 days | PRN
Start: 2022-01-25 — End: 2023-01-25

## 2022-01-25 MED ORDER — HYDROCODONE 5 MG-ACETAMINOPHEN 325 MG TABLET
ORAL_TABLET | Freq: Every day | ORAL | 0 refills | 40 days | PRN
Start: 2022-01-25 — End: ?

## 2022-01-26 MED ORDER — QUETIAPINE 50 MG TABLET
ORAL_TABLET | Freq: Every evening | ORAL | 11 refills | 30 days | Status: CP | PRN
Start: 2022-01-26 — End: 2023-01-26

## 2022-01-26 MED ORDER — HYDROCODONE 5 MG-ACETAMINOPHEN 325 MG TABLET
ORAL_TABLET | Freq: Every day | ORAL | 0 refills | 40 days | PRN
Start: 2022-01-26 — End: ?

## 2022-01-26 MED ORDER — TIZANIDINE 4 MG CAPSULE
ORAL_CAPSULE | Freq: Three times a day (TID) | ORAL | 3 refills | 30 days | Status: CP | PRN
Start: 2022-01-26 — End: ?

## 2022-01-27 MED ORDER — HYDROCODONE 5 MG-ACETAMINOPHEN 325 MG TABLET
ORAL_TABLET | Freq: Every day | ORAL | 0 refills | 40 days | Status: CP | PRN
Start: 2022-01-27 — End: ?

## 2022-02-10 DIAGNOSIS — G43801 Other migraine, not intractable, with status migrainosus: Principal | ICD-10-CM

## 2022-02-10 MED ORDER — QUETIAPINE 50 MG TABLET
ORAL_TABLET | Freq: Every evening | ORAL | 4 refills | 0 days | PRN
Start: 2022-02-10 — End: ?

## 2022-02-11 MED ORDER — QUETIAPINE 50 MG TABLET
ORAL_TABLET | Freq: Every evening | ORAL | 4 refills | 90 days | Status: CP | PRN
Start: 2022-02-11 — End: 2023-02-11

## 2022-02-24 DIAGNOSIS — G540 Brachial plexus disorders: Principal | ICD-10-CM

## 2022-02-24 DIAGNOSIS — G43801 Other migraine, not intractable, with status migrainosus: Principal | ICD-10-CM

## 2022-02-24 MED ORDER — TIZANIDINE 4 MG CAPSULE
ORAL_CAPSULE | Freq: Three times a day (TID) | ORAL | 3 refills | 30 days | Status: CP | PRN
Start: 2022-02-24 — End: ?

## 2022-02-24 MED ORDER — QUETIAPINE 50 MG TABLET
ORAL_TABLET | Freq: Every evening | ORAL | 4 refills | 90 days | Status: CP | PRN
Start: 2022-02-24 — End: 2023-02-24

## 2022-02-24 MED ORDER — HYDROCODONE 5 MG-ACETAMINOPHEN 325 MG TABLET
ORAL_TABLET | Freq: Every day | ORAL | 0 refills | 40 days | PRN
Start: 2022-02-24 — End: ?

## 2022-02-25 MED ORDER — HYDROCODONE 5 MG-ACETAMINOPHEN 325 MG TABLET
ORAL_TABLET | Freq: Every day | ORAL | 0 refills | 40 days | Status: CP | PRN
Start: 2022-02-25 — End: ?

## 2022-02-27 MED ORDER — TIZANIDINE 4 MG CAPSULE
ORAL_CAPSULE | Freq: Three times a day (TID) | ORAL | 3 refills | 30 days | PRN
Start: 2022-02-27 — End: ?

## 2022-03-01 MED ORDER — TIZANIDINE 4 MG TABLET
ORAL_TABLET | Freq: Three times a day (TID) | ORAL | 1 refills | 30 days | Status: CP
Start: 2022-03-01 — End: 2023-03-01

## 2022-04-15 ENCOUNTER — Ambulatory Visit: Admit: 2022-04-15 | Discharge: 2022-04-16 | Payer: PRIVATE HEALTH INSURANCE

## 2022-04-22 DIAGNOSIS — G540 Brachial plexus disorders: Principal | ICD-10-CM

## 2022-04-22 MED ORDER — HYDROCODONE 5 MG-ACETAMINOPHEN 325 MG TABLET
ORAL_TABLET | Freq: Every day | ORAL | 0 refills | 40 days | Status: CP | PRN
Start: 2022-04-22 — End: ?

## 2022-06-15 DIAGNOSIS — G540 Brachial plexus disorders: Principal | ICD-10-CM

## 2022-06-15 MED ORDER — HYDROCODONE 5 MG-ACETAMINOPHEN 325 MG TABLET
ORAL_TABLET | Freq: Every day | ORAL | 0 refills | 40 days | Status: CP | PRN
Start: 2022-06-15 — End: ?

## 2022-06-23 MED ORDER — TIZANIDINE 4 MG TABLET
ORAL_TABLET | Freq: Three times a day (TID) | ORAL | 0 refills | 30 days | Status: CP
Start: 2022-06-23 — End: ?

## 2022-07-14 DIAGNOSIS — G540 Brachial plexus disorders: Principal | ICD-10-CM

## 2022-07-14 MED ORDER — HYDROCODONE 5 MG-ACETAMINOPHEN 325 MG TABLET
ORAL_TABLET | Freq: Every day | ORAL | 0 refills | 40 days | PRN
Start: 2022-07-14 — End: ?

## 2022-07-15 MED ORDER — HYDROCODONE 5 MG-ACETAMINOPHEN 325 MG TABLET
ORAL_TABLET | Freq: Every day | ORAL | 0 refills | 40 days | Status: CP | PRN
Start: 2022-07-15 — End: ?

## 2022-08-23 DIAGNOSIS — G540 Brachial plexus disorders: Principal | ICD-10-CM

## 2022-08-23 MED ORDER — HYDROCODONE 5 MG-ACETAMINOPHEN 325 MG TABLET
ORAL_TABLET | Freq: Every day | ORAL | 0 refills | 40 days | PRN
Start: 2022-08-23 — End: ?

## 2022-08-24 MED ORDER — HYDROCODONE 5 MG-ACETAMINOPHEN 325 MG TABLET
ORAL_TABLET | Freq: Every day | ORAL | 0 refills | 40 days | Status: CP | PRN
Start: 2022-08-24 — End: ?

## 2022-10-13 DIAGNOSIS — G540 Brachial plexus disorders: Principal | ICD-10-CM

## 2022-10-13 MED ORDER — HYDROCODONE 5 MG-ACETAMINOPHEN 325 MG TABLET
ORAL_TABLET | Freq: Every day | ORAL | 0 refills | 40 days | PRN
Start: 2022-10-13 — End: ?

## 2022-10-14 MED ORDER — HYDROCODONE 5 MG-ACETAMINOPHEN 325 MG TABLET
ORAL_TABLET | Freq: Every day | ORAL | 0 refills | 20 days | Status: CP | PRN
Start: 2022-10-14 — End: ?

## 2022-10-25 ENCOUNTER — Telehealth
Admit: 2022-10-25 | Discharge: 2022-10-26 | Payer: PRIVATE HEALTH INSURANCE | Attending: Family Medicine | Primary: Family Medicine

## 2022-10-25 DIAGNOSIS — G540 Brachial plexus disorders: Principal | ICD-10-CM

## 2022-10-25 DIAGNOSIS — F4310 Post-traumatic stress disorder, unspecified: Principal | ICD-10-CM

## 2022-10-25 DIAGNOSIS — Z6835 Body mass index (BMI) 35.0-35.9, adult: Principal | ICD-10-CM

## 2022-10-25 DIAGNOSIS — Z1211 Encounter for screening for malignant neoplasm of colon: Principal | ICD-10-CM

## 2022-10-25 MED ORDER — LORAZEPAM 1 MG TABLET
ORAL_TABLET | Freq: Every day | ORAL | 5 refills | 10 days | Status: CP | PRN
Start: 2022-10-25 — End: 2023-10-25

## 2022-11-01 MED ORDER — HYDROCODONE 5 MG-ACETAMINOPHEN 325 MG TABLET
ORAL_TABLET | Freq: Every day | ORAL | 0 refills | 40 days | Status: CP | PRN
Start: 2022-11-01 — End: ?

## 2022-11-23 MED ORDER — AMOXICILLIN 875 MG-POTASSIUM CLAVULANATE 125 MG TABLET
ORAL_TABLET | Freq: Two times a day (BID) | ORAL | 0 refills | 7 days | Status: CP
Start: 2022-11-23 — End: 2022-11-30

## 2022-11-23 MED ORDER — NYSTATIN 100,000 UNIT/GRAM TOPICAL POWDER
0 refills | 0 days | Status: CP
Start: 2022-11-23 — End: 2023-11-23

## 2022-11-24 ENCOUNTER — Telehealth: Admit: 2022-11-24 | Discharge: 2022-11-25 | Payer: PRIVATE HEALTH INSURANCE

## 2022-11-24 DIAGNOSIS — R0789 Other chest pain: Principal | ICD-10-CM

## 2022-11-24 DIAGNOSIS — J0111 Acute recurrent frontal sinusitis: Principal | ICD-10-CM

## 2022-11-24 MED ORDER — PREDNISONE 20 MG TABLET
ORAL_TABLET | Freq: Every day | ORAL | 0 refills | 5 days | Status: CP
Start: 2022-11-24 — End: 2022-11-29

## 2022-12-07 ENCOUNTER — Ambulatory Visit
Admit: 2022-12-07 | Discharge: 2022-12-08 | Payer: PRIVATE HEALTH INSURANCE | Attending: Family Medicine | Primary: Family Medicine

## 2022-12-07 DIAGNOSIS — J4520 Mild intermittent asthma, uncomplicated: Principal | ICD-10-CM

## 2022-12-07 DIAGNOSIS — R5383 Other fatigue: Principal | ICD-10-CM

## 2022-12-07 DIAGNOSIS — Z1211 Encounter for screening for malignant neoplasm of colon: Principal | ICD-10-CM

## 2022-12-07 DIAGNOSIS — Z1159 Encounter for screening for other viral diseases: Principal | ICD-10-CM

## 2022-12-07 DIAGNOSIS — Z6838 Body mass index (BMI) 38.0-38.9, adult: Principal | ICD-10-CM

## 2022-12-07 DIAGNOSIS — G540 Brachial plexus disorders: Principal | ICD-10-CM

## 2022-12-07 MED ORDER — QVAR REDIHALER 80 MCG/ACTUATION HFA BREATH ACTIVATED AEROSOL
Freq: Two times a day (BID) | RESPIRATORY_TRACT | 0 refills | 0 days | Status: CN
Start: 2022-12-07 — End: ?

## 2022-12-07 MED ORDER — METFORMIN ER 500 MG TABLET,EXTENDED RELEASE 24 HR
ORAL_TABLET | Freq: Every day | ORAL | 2 refills | 30 days | Status: CP
Start: 2022-12-07 — End: 2023-12-07

## 2022-12-09 MED ORDER — HYDROCODONE 5 MG-ACETAMINOPHEN 325 MG TABLET
ORAL_TABLET | Freq: Every day | ORAL | 0 refills | 40 days | Status: CP | PRN
Start: 2022-12-09 — End: ?

## 2022-12-16 ENCOUNTER — Ambulatory Visit: Admit: 2022-12-16 | Discharge: 2022-12-17 | Payer: PRIVATE HEALTH INSURANCE

## 2022-12-16 DIAGNOSIS — Z1211 Encounter for screening for malignant neoplasm of colon: Principal | ICD-10-CM

## 2023-01-08 MED ORDER — HYDROCODONE 5 MG-ACETAMINOPHEN 325 MG TABLET
ORAL_TABLET | Freq: Every day | ORAL | 0 refills | 40 days | Status: CP | PRN
Start: 2023-01-08 — End: ?

## 2023-01-21 DIAGNOSIS — G540 Brachial plexus disorders: Principal | ICD-10-CM

## 2023-01-21 MED ORDER — HYDROCODONE 5 MG-ACETAMINOPHEN 325 MG TABLET
ORAL_TABLET | Freq: Every day | ORAL | 0 refills | 40 days | PRN
Start: 2023-01-21 — End: ?

## 2023-01-23 MED ORDER — HYDROCODONE 5 MG-ACETAMINOPHEN 325 MG TABLET
ORAL_TABLET | Freq: Every day | ORAL | 0 refills | 40 days | PRN
Start: 2023-01-23 — End: ?

## 2023-01-27 MED ORDER — HYDROCODONE 5 MG-ACETAMINOPHEN 325 MG TABLET
ORAL_TABLET | Freq: Every day | ORAL | 0 refills | 40 days | Status: CP | PRN
Start: 2023-01-27 — End: ?

## 2023-02-03 DIAGNOSIS — G43801 Other migraine, not intractable, with status migrainosus: Principal | ICD-10-CM

## 2023-02-03 MED ORDER — QUETIAPINE 50 MG TABLET
ORAL_TABLET | 3 refills | 0 days
Start: 2023-02-03 — End: ?

## 2023-02-05 MED ORDER — HYDROCODONE 5 MG-ACETAMINOPHEN 325 MG TABLET
ORAL_TABLET | Freq: Every day | ORAL | 0 refills | 40 days | Status: CP | PRN
Start: 2023-02-05 — End: ?

## 2023-02-07 MED ORDER — QUETIAPINE 50 MG TABLET
ORAL_TABLET | 2 refills | 0 days | Status: CP
Start: 2023-02-07 — End: ?

## 2023-03-06 ENCOUNTER — Telehealth: Admit: 2023-03-06 | Payer: PRIVATE HEALTH INSURANCE

## 2023-03-06 MED ORDER — SULFAMETHOXAZOLE 800 MG-TRIMETHOPRIM 160 MG TABLET
ORAL_TABLET | Freq: Two times a day (BID) | ORAL | 0 refills | 10 days | Status: CP
Start: 2023-03-06 — End: 2023-03-16

## 2023-03-06 MED ORDER — PREDNISONE 20 MG TABLET
ORAL_TABLET | 0 refills | 0 days | Status: CP
Start: 2023-03-06 — End: ?

## 2023-04-24 DIAGNOSIS — Z1231 Encounter for screening mammogram for malignant neoplasm of breast: Principal | ICD-10-CM

## 2023-04-24 DIAGNOSIS — G540 Brachial plexus disorders: Principal | ICD-10-CM

## 2023-04-24 MED ORDER — HYDROCODONE 5 MG-ACETAMINOPHEN 325 MG TABLET
ORAL_TABLET | Freq: Every day | ORAL | 0 refills | 40 days | Status: CP | PRN
Start: 2023-04-24 — End: ?

## 2023-04-25 ENCOUNTER — Ambulatory Visit: Admit: 2023-04-25 | Discharge: 2023-04-26 | Payer: BLUE CROSS/BLUE SHIELD

## 2023-05-03 MED ORDER — TIZANIDINE 4 MG TABLET
ORAL_TABLET | Freq: Three times a day (TID) | ORAL | 0 refills | 30 days | Status: CP
Start: 2023-05-03 — End: ?

## 2023-05-24 DIAGNOSIS — R0602 Shortness of breath: Principal | ICD-10-CM

## 2023-05-24 DIAGNOSIS — G540 Brachial plexus disorders: Principal | ICD-10-CM

## 2023-05-24 DIAGNOSIS — F4310 Post-traumatic stress disorder, unspecified: Principal | ICD-10-CM

## 2023-05-24 MED ORDER — LORAZEPAM 1 MG TABLET
ORAL_TABLET | Freq: Every day | ORAL | 5 refills | 10 days | Status: CP | PRN
Start: 2023-05-24 — End: 2024-05-23

## 2023-05-24 MED ORDER — HYDROCODONE 5 MG-ACETAMINOPHEN 325 MG TABLET
ORAL_TABLET | Freq: Every day | ORAL | 0 refills | 40.00 days | Status: CP | PRN
Start: 2023-05-24 — End: ?

## 2023-06-02 ENCOUNTER — Ambulatory Visit: Admit: 2023-06-02 | Discharge: 2023-06-03 | Payer: BLUE CROSS/BLUE SHIELD

## 2023-06-23 ENCOUNTER — Emergency Department (HOSPITAL_COMMUNITY): Payer: BC Managed Care – PPO

## 2023-06-23 ENCOUNTER — Inpatient Hospital Stay (HOSPITAL_COMMUNITY): Payer: BC Managed Care – PPO

## 2023-06-23 ENCOUNTER — Inpatient Hospital Stay (HOSPITAL_COMMUNITY)
Admission: EM | Admit: 2023-06-23 | Discharge: 2023-06-27 | DRG: 862 | Disposition: A | Discharge status: Home / Self Care | Payer: BC Managed Care – PPO | Attending: Internal Medicine | Admitting: Internal Medicine

## 2023-06-23 ENCOUNTER — Encounter (HOSPITAL_COMMUNITY): Payer: Self-pay

## 2023-06-23 DIAGNOSIS — L03311 Cellulitis of abdominal wall: Secondary | ICD-10-CM | POA: Diagnosis present

## 2023-06-23 DIAGNOSIS — A419 Sepsis, unspecified organism: Secondary | ICD-10-CM | POA: Diagnosis present

## 2023-06-23 DIAGNOSIS — T148XXA Other injury of unspecified body region, initial encounter: Secondary | ICD-10-CM

## 2023-06-23 DIAGNOSIS — Z8249 Family history of ischemic heart disease and other diseases of the circulatory system: Secondary | ICD-10-CM | POA: Diagnosis not present

## 2023-06-23 DIAGNOSIS — K760 Fatty (change of) liver, not elsewhere classified: Secondary | ICD-10-CM | POA: Diagnosis present

## 2023-06-23 DIAGNOSIS — E66812 Obesity, class 2: Secondary | ICD-10-CM | POA: Diagnosis present

## 2023-06-23 DIAGNOSIS — Z1623 Resistance to quinolones and fluoroquinolones: Secondary | ICD-10-CM | POA: Diagnosis present

## 2023-06-23 DIAGNOSIS — L7634 Postprocedural seroma of skin and subcutaneous tissue following other procedure: Secondary | ICD-10-CM | POA: Diagnosis present

## 2023-06-23 DIAGNOSIS — H5340 Unspecified visual field defects: Secondary | ICD-10-CM | POA: Diagnosis present

## 2023-06-23 DIAGNOSIS — Z1152 Encounter for screening for COVID-19: Secondary | ICD-10-CM

## 2023-06-23 DIAGNOSIS — Z5941 Food insecurity: Secondary | ICD-10-CM

## 2023-06-23 DIAGNOSIS — R21 Rash and other nonspecific skin eruption: Secondary | ICD-10-CM | POA: Diagnosis present

## 2023-06-23 DIAGNOSIS — Z841 Family history of disorders of kidney and ureter: Secondary | ICD-10-CM

## 2023-06-23 DIAGNOSIS — L089 Local infection of the skin and subcutaneous tissue, unspecified: Secondary | ICD-10-CM | POA: Diagnosis present

## 2023-06-23 DIAGNOSIS — Z6838 Body mass index (BMI) 38.0-38.9, adult: Secondary | ICD-10-CM

## 2023-06-23 DIAGNOSIS — B965 Pseudomonas (aeruginosa) (mallei) (pseudomallei) as the cause of diseases classified elsewhere: Secondary | ICD-10-CM | POA: Diagnosis present

## 2023-06-23 DIAGNOSIS — M797 Fibromyalgia: Secondary | ICD-10-CM | POA: Diagnosis present

## 2023-06-23 DIAGNOSIS — G43909 Migraine, unspecified, not intractable, without status migrainosus: Secondary | ICD-10-CM | POA: Diagnosis present

## 2023-06-23 DIAGNOSIS — T8149XA Infection following a procedure, other surgical site, initial encounter: Secondary | ICD-10-CM | POA: Insufficient documentation

## 2023-06-23 DIAGNOSIS — T8141XA Infection following a procedure, superficial incisional surgical site, initial encounter: Principal | ICD-10-CM | POA: Diagnosis present

## 2023-06-23 DIAGNOSIS — Z8619 Personal history of other infectious and parasitic diseases: Secondary | ICD-10-CM

## 2023-06-23 DIAGNOSIS — R188 Other ascites: Secondary | ICD-10-CM | POA: Diagnosis present

## 2023-06-23 DIAGNOSIS — T8130XA Disruption of wound, unspecified, initial encounter: Secondary | ICD-10-CM | POA: Diagnosis present

## 2023-06-23 DIAGNOSIS — Y838 Other surgical procedures as the cause of abnormal reaction of the patient, or of later complication, without mention of misadventure at the time of the procedure: Secondary | ICD-10-CM | POA: Diagnosis present

## 2023-06-23 DIAGNOSIS — A6009 Herpesviral infection of other urogenital tract: Secondary | ICD-10-CM | POA: Diagnosis present

## 2023-06-23 DIAGNOSIS — B9620 Unspecified Escherichia coli [E. coli] as the cause of diseases classified elsewhere: Secondary | ICD-10-CM | POA: Diagnosis present

## 2023-06-23 DIAGNOSIS — Z886 Allergy status to analgesic agent status: Secondary | ICD-10-CM | POA: Diagnosis not present

## 2023-06-23 DIAGNOSIS — Z811 Family history of alcohol abuse and dependence: Secondary | ICD-10-CM

## 2023-06-23 DIAGNOSIS — K219 Gastro-esophageal reflux disease without esophagitis: Secondary | ICD-10-CM | POA: Diagnosis present

## 2023-06-23 DIAGNOSIS — Z91048 Other nonmedicinal substance allergy status: Secondary | ICD-10-CM

## 2023-06-23 DIAGNOSIS — Z91040 Latex allergy status: Secondary | ICD-10-CM

## 2023-06-23 DIAGNOSIS — R1084 Generalized abdominal pain: Secondary | ICD-10-CM | POA: Diagnosis present

## 2023-06-23 DIAGNOSIS — Z83438 Family history of other disorder of lipoprotein metabolism and other lipidemia: Secondary | ICD-10-CM

## 2023-06-23 DIAGNOSIS — Z8261 Family history of arthritis: Secondary | ICD-10-CM

## 2023-06-23 DIAGNOSIS — Z87891 Personal history of nicotine dependence: Secondary | ICD-10-CM | POA: Diagnosis not present

## 2023-06-23 DIAGNOSIS — T8140XD Infection following a procedure, unspecified, subsequent encounter: Secondary | ICD-10-CM | POA: Diagnosis not present

## 2023-06-23 DIAGNOSIS — Z79899 Other long term (current) drug therapy: Secondary | ICD-10-CM

## 2023-06-23 LAB — I-STAT VENOUS BLOOD GAS, ED
Acid-Base Excess: 3 mmol/L — ABNORMAL HIGH (ref 0.0–2.0)
Acid-base deficit: 13 mmol/L — ABNORMAL HIGH (ref 0.0–2.0)
Bicarbonate: 19.6 mmol/L — ABNORMAL LOW (ref 20.0–28.0)
Bicarbonate: 27.6 mmol/L (ref 20.0–28.0)
Calcium, Ion: 1.34 mmol/L (ref 1.15–1.40)
Calcium, Ion: 0.98 mmol/L — ABNORMAL LOW (ref 1.15–1.40)
HCT: 23 % — ABNORMAL LOW (ref 36.0–46.0)
HCT: 36 % (ref 36.0–46.0)
Hemoglobin: 7.8 g/dL — ABNORMAL LOW (ref 12.0–15.0)
Hemoglobin: 12.2 g/dL (ref 12.0–15.0)
O2 Saturation: 67 %
O2 Saturation: 53 %
Potassium: 7.7 mmol/L (ref 3.5–5.1)
Potassium: 6.1 mmol/L — ABNORMAL HIGH (ref 3.5–5.1)
Sodium: 138 mmol/L (ref 135–145)
Sodium: 136 mmol/L (ref 135–145)
TCO2: 22 mmol/L (ref 22–32)
TCO2: 29 mmol/L (ref 22–32)
pCO2, Ven: 93.3 mm[Hg] (ref 44–60)
pCO2, Ven: 40 mm[Hg] — ABNORMAL LOW (ref 44–60)
pH, Ven: 6.93 — CL (ref 7.25–7.43)
pH, Ven: 7.448 — ABNORMAL HIGH (ref 7.25–7.43)
pO2, Ven: 58 mm[Hg] — ABNORMAL HIGH (ref 32–45)
pO2, Ven: 27 mm[Hg] — CL (ref 32–45)

## 2023-06-23 LAB — COMPREHENSIVE METABOLIC PANEL
ALT: 10 U/L (ref 0–44)
AST: 14 U/L — ABNORMAL LOW (ref 15–41)
Albumin: 3.2 g/dL — ABNORMAL LOW (ref 3.5–5.0)
Alkaline Phosphatase: 60 U/L (ref 38–126)
Anion gap: 9 (ref 5–15)
BUN: 6 mg/dL (ref 6–20)
CO2: 24 mmol/L (ref 22–32)
Calcium: 8.7 mg/dL — ABNORMAL LOW (ref 8.9–10.3)
Chloride: 103 mmol/L (ref 98–111)
Creatinine, Ser: 0.81 mg/dL (ref 0.44–1.00)
GFR, Estimated: >60 mL/min (ref >60)
Glucose, Bld: 160 mg/dL — ABNORMAL HIGH (ref 70–99)
Potassium: 3.5 mmol/L (ref 3.5–5.1)
Sodium: 136 mmol/L (ref 135–145)
Total Bilirubin: 0.7 mg/dL (ref 0.0–1.2)
Total Protein: 6.6 g/dL (ref 6.5–8.1)

## 2023-06-23 LAB — RESP PANEL BY RT-PCR (RSV, FLU A&B, COVID)  RVPGX2
Influenza A by PCR: NEGATIVE
Influenza B by PCR: NEGATIVE
Resp Syncytial Virus by PCR: NEGATIVE
SARS Coronavirus 2 by RT PCR: NEGATIVE

## 2023-06-23 LAB — URINALYSIS, ROUTINE W REFLEX MICROSCOPIC
Bilirubin Urine: NEGATIVE
Glucose, UA: NEGATIVE mg/dL
Hgb urine dipstick: NEGATIVE
Ketones, ur: NEGATIVE mg/dL
Leukocytes,Ua: NEGATIVE
Nitrite: NEGATIVE
Protein, ur: 30 mg/dL — AB
Specific Gravity, Urine: 1.035 — ABNORMAL HIGH (ref 1.005–1.030)
pH: 5 (ref 5.0–8.0)

## 2023-06-23 LAB — CBC WITH DIFFERENTIAL/PLATELET
Abs Immature Granulocytes: 0.05 10*3/uL (ref 0.00–0.07)
Basophils Absolute: 0 10*3/uL (ref 0.0–0.1)
Basophils Relative: 0 %
Eosinophils Absolute: 0.1 10*3/uL (ref 0.0–0.5)
Eosinophils Relative: 1 %
HCT: 38.7 % (ref 36.0–46.0)
Hemoglobin: 13.1 g/dL (ref 12.0–15.0)
Immature Granulocytes: 0 %
Lymphocytes Relative: 14 %
Lymphs Abs: 1.7 10*3/uL (ref 0.7–4.0)
MCH: 32 pg (ref 26.0–34.0)
MCHC: 33.9 g/dL (ref 30.0–36.0)
MCV: 94.6 fL (ref 80.0–100.0)
Monocytes Absolute: 0.7 10*3/uL (ref 0.1–1.0)
Monocytes Relative: 6 %
Neutro Abs: 9.8 10*3/uL — ABNORMAL HIGH (ref 1.7–7.7)
Neutrophils Relative %: 79 %
Platelets: 329 10*3/uL (ref 150–400)
RBC: 4.09 MIL/uL (ref 3.87–5.11)
RDW: 12.2 % (ref 11.5–15.5)
WBC: 12.4 10*3/uL — ABNORMAL HIGH (ref 4.0–10.5)
nRBC: 0 % (ref 0.0–0.2)

## 2023-06-23 LAB — I-STAT CHEM 8, ED
BUN: 7 mg/dL (ref 6–20)
Calcium, Ion: 0.97 mmol/L — ABNORMAL LOW (ref 1.15–1.40)
Chloride: 105 mmol/L (ref 98–111)
Creatinine, Ser: 0.6 mg/dL (ref 0.44–1.00)
Glucose, Bld: 108 mg/dL — ABNORMAL HIGH (ref 70–99)
HCT: 36 % (ref 36.0–46.0)
Hemoglobin: 12.2 g/dL (ref 12.0–15.0)
Potassium: 6 mmol/L — ABNORMAL HIGH (ref 3.5–5.1)
Sodium: 136 mmol/L (ref 135–145)
TCO2: 25 mmol/L (ref 22–32)

## 2023-06-23 LAB — LIPASE, BLOOD: Lipase: 20 U/L (ref 11–51)

## 2023-06-23 LAB — I-STAT CG4 LACTIC ACID, ED: Lactic Acid, Venous: 1.7 mmol/L (ref 0.5–1.9)

## 2023-06-23 LAB — D-DIMER, QUANTITATIVE: D-Dimer, Quant: 0.43 ug{FEU}/mL (ref 0.00–0.50)

## 2023-06-23 LAB — HCG, SERUM, QUALITATIVE: Preg, Serum: NEGATIVE

## 2023-06-23 MED ORDER — DOCUSATE SODIUM 100 MG PO CAPS
200.0000 mg | ORAL_CAPSULE | Freq: Every day | ORAL | Status: DC
Start: 2023-06-24 — End: 2023-06-27
  Administered 2023-06-24 – 2023-06-27 (×4): 200 mg via ORAL
  Filled 2023-06-23 (×4): qty 2

## 2023-06-23 MED ORDER — ENOXAPARIN SODIUM 40 MG/0.4ML IJ SOSY
40.0000 mg | PREFILLED_SYRINGE | INTRAMUSCULAR | Status: DC
  Filled 2023-06-23 (×3): qty 0.4

## 2023-06-23 MED ORDER — SODIUM CHLORIDE 0.9 % IV SOLN: INTRAVENOUS | Status: AC

## 2023-06-23 MED ORDER — FENTANYL CITRATE PF 50 MCG/ML IJ SOSY
25.0000 ug | PREFILLED_SYRINGE | Freq: Once | INTRAMUSCULAR | Status: AC
  Administered 2023-06-23: 25 ug via INTRAVENOUS
  Filled 2023-06-23: qty 1

## 2023-06-23 MED ORDER — SODIUM ZIRCONIUM CYCLOSILICATE 10 G PO PACK: 10.0000 g | PACK | Freq: Once | ORAL | Status: DC

## 2023-06-23 MED ORDER — LORAZEPAM 1 MG PO TABS
1.0000 mg | ORAL_TABLET | Freq: Every day | ORAL | Status: DC | PRN
  Administered 2023-06-24: 1 mg via ORAL
  Filled 2023-06-23: qty 1

## 2023-06-23 MED ORDER — ONDANSETRON HCL 4 MG/2ML IJ SOLN
4.0000 mg | Freq: Once | INTRAMUSCULAR | Status: AC
Start: 2023-06-23 — End: 2023-06-23
  Administered 2023-06-23: 4 mg via INTRAVENOUS
  Filled 2023-06-23: qty 2

## 2023-06-23 MED ORDER — ALBUTEROL SULFATE (2.5 MG/3ML) 0.083% IN NEBU: 3.0000 mL | INHALATION_SOLUTION | Freq: Four times a day (QID) | RESPIRATORY_TRACT | Status: DC | PRN

## 2023-06-23 MED ORDER — ACETAMINOPHEN 325 MG PO TABS: 650.0000 mg | ORAL_TABLET | Freq: Four times a day (QID) | ORAL | Status: DC | PRN

## 2023-06-23 MED ORDER — ACETAMINOPHEN 500 MG PO TABS
1000.0000 mg | ORAL_TABLET | Freq: Once | ORAL | Status: AC
  Administered 2023-06-23: 1000 mg via ORAL
  Filled 2023-06-23: qty 2

## 2023-06-23 MED ORDER — SIMETHICONE 80 MG PO CHEW
80.0000 mg | CHEWABLE_TABLET | Freq: Four times a day (QID) | ORAL | Status: DC | PRN
  Administered 2023-06-24 – 2023-06-25 (×2): 80 mg via ORAL
  Filled 2023-06-23 (×2): qty 1

## 2023-06-23 MED ORDER — LACTATED RINGERS IV BOLUS
1000.0000 mL | Freq: Once | INTRAVENOUS | Status: AC
  Administered 2023-06-24: 1000 mL via INTRAVENOUS

## 2023-06-23 MED ORDER — VANCOMYCIN HCL 750 MG IV SOLR
750.0000 mg | Freq: Two times a day (BID) | INTRAVENOUS | Status: DC
  Filled 2023-06-23 (×3): qty 15

## 2023-06-23 MED ORDER — PIPERACILLIN-TAZOBACTAM 3.375 G IVPB
3.3750 g | Freq: Three times a day (TID) | INTRAVENOUS | Status: DC
Start: 2023-06-24 — End: 2023-06-25
  Administered 2023-06-24 – 2023-06-25 (×5): 3.375 g via INTRAVENOUS
  Filled 2023-06-23 (×5): qty 50

## 2023-06-23 MED ORDER — IOHEXOL 350 MG/ML SOLN
75.0000 mL | Freq: Once | INTRAVENOUS | Status: AC | PRN
  Administered 2023-06-23: 75 mL via INTRAVENOUS

## 2023-06-23 MED ORDER — VANCOMYCIN HCL IN DEXTROSE 1-5 GM/200ML-% IV SOLN: 1000.0000 mg | Freq: Once | INTRAVENOUS | Status: DC

## 2023-06-23 MED ORDER — QUETIAPINE FUMARATE 50 MG PO TABS
50.0000 mg | ORAL_TABLET | Freq: Every day | ORAL | Status: DC
  Administered 2023-06-24 – 2023-06-26 (×4): 50 mg via ORAL
  Filled 2023-06-23: qty 2
  Filled 2023-06-23 (×3): qty 1

## 2023-06-23 MED ORDER — SODIUM CHLORIDE 0.9% FLUSH
3.0000 mL | Freq: Two times a day (BID) | INTRAVENOUS | Status: DC
  Administered 2023-06-23 – 2023-06-26 (×6): 3 mL via INTRAVENOUS

## 2023-06-23 MED ORDER — ACETAMINOPHEN 650 MG RE SUPP: 650.0000 mg | Freq: Four times a day (QID) | RECTAL | Status: DC | PRN

## 2023-06-23 MED ORDER — POLYETHYLENE GLYCOL 3350 17 G PO PACK: 17.0000 g | PACK | Freq: Every day | ORAL | Status: DC | PRN

## 2023-06-23 MED ORDER — FLUTICASONE PROPIONATE 50 MCG/ACT NA SUSP
1.0000 | Freq: Every day | NASAL | Status: DC
  Administered 2023-06-25 – 2023-06-27 (×3): 1 via NASAL
  Filled 2023-06-23 (×2): qty 16

## 2023-06-23 MED ORDER — HYDROCODONE-ACETAMINOPHEN 5-325 MG PO TABS
1.0000 | ORAL_TABLET | Freq: Four times a day (QID) | ORAL | Status: DC | PRN
  Administered 2023-06-24: 1 via ORAL
  Filled 2023-06-23: qty 1

## 2023-06-23 MED ORDER — SODIUM CHLORIDE 0.9 % IV BOLUS
1000.0000 mL | Freq: Once | INTRAVENOUS | Status: AC
  Administered 2023-06-23: 1000 mL via INTRAVENOUS

## 2023-06-23 MED ORDER — ONDANSETRON HCL 4 MG PO TABS: 4.0000 mg | ORAL_TABLET | Freq: Three times a day (TID) | ORAL | Status: DC | PRN

## 2023-06-23 MED ORDER — OXYCODONE-ACETAMINOPHEN 5-325 MG PO TABS
1.0000 | ORAL_TABLET | Freq: Four times a day (QID) | ORAL | Status: DC | PRN
  Administered 2023-06-24: 1 via ORAL
  Filled 2023-06-23: qty 1

## 2023-06-23 MED ORDER — PIPERACILLIN-TAZOBACTAM 3.375 G IVPB 30 MIN
3.3750 g | Freq: Once | INTRAVENOUS | Status: AC
  Administered 2023-06-23: 3.375 g via INTRAVENOUS
  Filled 2023-06-23: qty 50

## 2023-06-23 MED ORDER — VANCOMYCIN HCL 750 MG/150ML IV SOLN
750.0000 mg | Freq: Two times a day (BID) | INTRAVENOUS | Status: DC
  Filled 2023-06-23: qty 150

## 2023-06-23 MED ORDER — TIZANIDINE HCL 4 MG PO TABS
4.0000 mg | ORAL_TABLET | Freq: Three times a day (TID) | ORAL | Status: DC | PRN
  Administered 2023-06-24 – 2023-06-26 (×4): 4 mg via ORAL
  Filled 2023-06-23 (×4): qty 1

## 2023-06-23 MED ORDER — SODIUM CHLORIDE 0.9 % IV SOLN
1500.0000 mg | Freq: Once | INTRAVENOUS | Status: AC
  Administered 2023-06-23: 1500 mg via INTRAVENOUS
  Filled 2023-06-23: qty 30

## 2023-06-23 NOTE — ED Notes (Signed)
Hospitalist at bedside 

## 2023-06-23 NOTE — ED Provider Notes (Signed)
[place] EMERGENCY DEPARTMENT AT [facility] Provider Note   CSN: [id number] Arrival date & time: 06/23/23  1353     History  Chief Complaint  Patient presents with   Abdominal Pain    Brooke Rush is a 48 y.o. female.  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  She arrives via EMS.  History is obtained by those individuals and the patient herself.  She notes that she is recovering generally well, had a postop visit last week, was designated as having well-healing suture line.  Over the past 2 days in particular she has developed severe sustained abdominal pain, improved with anything.  EMS reports no hemodynamic stability in transport.    Home Medications Prior to Admission medications   Medication Sig Start Date End Date Taking? Authorizing Provider  albuterol (PROVENTIL HFA) 108 (90 Base) MCG/ACT inhaler Inhale into the lungs. 03/10/17 03/10/18  [provider]  amoxicillin-clavulanate (AUGMENTIN) 875-125 MG tablet Take 1 tablet by mouth every 12 (twelve) hours. 11/16/20   Mickie Bail, NP  docusate sodium (COLACE) 100 MG capsule Take 100 mg 2 (two) times daily by mouth.    [provider]  ferrous sulfate 325 (65 FE) MG tablet Take 1 tablet (325 mg total) by mouth 2 (two) times daily with a meal. 10/18/17   Sharee Pimple, CNM  phenazopyridine (PYRIDIUM) 200 MG tablet Take 1 tablet (200 mg total) by mouth 3 (three) times daily. 10/06/20   Wieters, Hallie C, PA-C  QUEtiapine (SEROQUEL) 50 MG tablet Take 50 mg by mouth. 06/08/20 06/08/21  [provider]  tiZANidine (ZANAFLEX) 4 MG capsule Take 4 mg by mouth 3 (three) times daily as needed for muscle spasms.    [provider]  venlafaxine XR (EFFEXOR-XR) 150 MG 24 hr capsule Take 150 mg by mouth daily with breakfast.    [provider]      Allergies    Latex, Other, and Nsaids    Review of  Systems   Review of Systems  Physical Exam Updated Vital Signs BP (!) 141/74 (BP Location: Right Arm)   Pulse (!) 107   Temp 98 F (36.7 C) (Oral)   Resp 13   Ht 5\' 4"  (1.626 m)   Wt 97.5 kg   SpO2 98%   BMI 36.90 kg/m  Physical Exam Vitals and nursing note reviewed.  Constitutional:      General: She is not in acute distress.    Appearance: She is well-developed.  HENT:     Head: Normocephalic and atraumatic.  Eyes:     Conjunctiva/sclera: Conjunctivae normal.  Cardiovascular:     Rate and Rhythm: Normal rate and regular rhythm.  Pulmonary:     Effort: Pulmonary effort is normal. No respiratory distress.     Breath sounds: Normal breath sounds. No stridor.  Abdominal:     General: There is no distension.     Tenderness: There is abdominal tenderness. There is guarding.     Comments: Midline surgical incision with scab, appropriately.  No surrounding erythema, purulence.  Skin:    General: Skin is warm and dry.  Neurological:     Mental Status: She is alert and oriented to person, place, and time.     Cranial Nerves: No cranial nerve deficit.  Psychiatric:        Mood and Affect: Mood normal.     ED Results / Procedures / Treatments  Labs (all labs ordered are listed, but only abnormal results are displayed) Labs Reviewed  I-STAT VENOUS BLOOD GAS, ED - Abnormal; Notable for the following components:      Result Value   pH, Ven 6.930 (*)    pCO2, Ven 93.3 (*)    pO2, Ven 58 (*)    Bicarbonate 19.6 (*)    Acid-base deficit 13.0 (*)    Potassium 7.7 (*)    HCT 23.0 (*)    Hemoglobin 7.8 (*)    All other components within normal limits  COMPREHENSIVE METABOLIC PANEL  LIPASE, BLOOD  CBC WITH DIFFERENTIAL/PLATELET  URINALYSIS, ROUTINE W REFLEX MICROSCOPIC  HCG, SERUM, QUALITATIVE  I-STAT CG4 LACTIC ACID, ED    EKG EKG Interpretation Date/Time:  Friday June 23 2023 14:01:11 EST Ventricular Rate:  107 PR Interval:  132 QRS Duration:  85 QT  Interval:  330 QTC Calculation: 441 R Axis:   61  Text Interpretation: Sinus tachycardia Atrial premature complex Confirmed by Gerhard Munch [phone]) on 06/23/2023 2:34:37 PM  Radiology No results found.  Procedures Procedures    Medications Ordered in ED Medications  sodium chloride 0.9 % bolus 1,000 mL (1,000 mLs Intravenous New Bag/Given 06/23/23 1414)    And  0.9 %  sodium chloride infusion (has no administration in time range)  fentaNYL (SUBLIMAZE) injection 25 mcg (25 mcg Intravenous Given 06/23/23 1417)  ondansetron (ZOFRAN) injection 4 mg (4 mg Intravenous Given 06/23/23 1417)    ED Course/ Medical Decision Making/ A&P                                 Medical Decision Making Adult female presents in the perioperative period following panniculectomy, now with abdominal pain, guarding, nausea and reported fever at home.  Broad differential including postop infection, seroma, obstruction as well as considerations of intrathoracic processes given her description of shortness of breath, patient started on analgesics, antiemetics, monitoring, x-ray, labs all ordered.  Amount and/or Complexity of Data Reviewed Independent Historian: EMS External Data Reviewed: notes. Labs: ordered. Decision-making details documented in ED Course. Radiology: ordered and independent interpretation performed. Decision-making details documented in ED Course. ECG/medicine tests: ordered and independent interpretation performed. Decision-making details documented in ED Course.  Risk Prescription drug management. Decision regarding hospitalization.   2:53 PM Patient with mild tachycardia, 105 sinus tach abnormal Pulse ox 97% room air normal I reviewed the x-ray at bedside, no obvious pneumoperitoneum, suboptimal study.  Patient awaiting lab results, studies thus far unremarkable.  Point-of-care chemistry likely hemolyzed.  Regular chemistry unremarkable.  Patient does have leukocytosis.  On  signout patient is awaiting CT, additional labs, repeat evaluation.  Final Clinical Impression(s) / ED Diagnoses Final diagnoses:  Generalized abdominal pain     Gerhard Munch, MD 06/23/23 1557

## 2023-06-23 NOTE — ED Triage Notes (Addendum)
Pt arrives via ems for the c/o fever yesterday and drainage from surgical site. Fever controlkled with tylenol, per family pt also was talking out of her head yesterday. Pt alert and oriented today. Pt also c/o abd pain. Distention noted to abd, pt states her hands seem a little swollen.   Hr 120 Bp 140/90 R20 02 98% RA

## 2023-06-23 NOTE — Progress Notes (Signed)
Pharmacy Antibiotic Note  Brooke Rush is a 48 y.o. female admitted on 06/23/2023 presenting s/p panniculectomy with purulence and concern for wound infection.  Pharmacy has been consulted for vancomycin and zosyn dosing.  Vancomycin 1500 mg IV x 1 given in ED  Plan: Vancomycin 750 mg IV q 12h (eAUC 509) Zosyn 3.375g IV every 8 hours (extended 4h infusion) Monitor renal function, Cx and clinical progression to narrow Vancomycin levels as indicated   Height: 5\' 4"  (162.6 cm) Weight: 97.5 kg (215 lb) IBW/kg (Calculated) : 54.7  Temp (24hrs), Avg:98.5 F (36.9 C), Min:97.9 F (36.6 C), Max:99.6 F (37.6 C)  Recent Labs  Lab 06/23/23 1412 06/23/23 1420 06/23/23 1604  WBC 12.4*  --   --   CREATININE 0.81  --  0.60  LATICACIDVEN  --  1.7  --     Estimated Creatinine Clearance: 99.6 mL/min (by C-G formula based on SCr of 0.6 mg/dL).    Allergies  Allergen Reactions   Latex Hives   Other Anaphylaxis    Paper mask   Nsaids Nausea And Vomiting    Acid reflux    Daylene Posey, PharmD, [facility] Clinical Pharmacist ED Pharmacist Phone # [phone] 06/23/2023 10:58 PM

## 2023-06-23 NOTE — ED Provider Notes (Addendum)
  Physical Exam  BP 108/61   Pulse (!) 111   Temp 99.6 F (37.6 C) (Oral)   Resp 13   Ht 5\' 4"  (1.626 m)   Wt 97.5 kg   SpO2 97%   BMI 36.90 kg/m   Physical Exam  Procedures  Procedures  ED Course / MDM    Medical Decision Making Patient care assumed at 3 PM.  Patient had fever yesterday and recent panniculectomy with Dr. Meredith Mody from [place] about 2 weeks ago.  White blood cell count is 12.  Signout pending CT abdomen pelvis.  5 pm I discussed case with Dr. Meredith Mody from [place].  She agreed with broad-spectrum antibiotics and wants a call back after CT results  7:20 PM CT results showed fat stranding in the subcutaneous tissue and small fluid collection but no obvious abscess.  I discussed with Dr. Meredith Mody again.  She reviewed the read with me and she states that this is expected postop.  She agreed with IV antibiotics and medicine admission.  She states that if situation gets worse, IR can put in a drain or get the sampling.  She does not think patient needs surgery.  I discussed with patient regarding transfer.  She states that she does not want to be transferred to [facility] as no surgery was planned at this point.  I contacted medicine service to admit the patient. Per Dr. Meredith Mody, we can contact her for any questions. Her cell is [phone]  Problems Addressed: Abdominal wall cellulitis: acute illness or injury Generalized abdominal pain: acute illness or injury  Amount and/or Complexity of Data Reviewed Labs: ordered. Decision-making details documented in ED Course. Radiology: ordered and independent interpretation performed. Decision-making details documented in ED Course.  Risk OTC drugs. Prescription drug management. Decision regarding hospitalization.          Charlynne Pander, MD 06/23/23 Norberta Keens    Charlynne Pander, MD 06/23/23 Ernestina Columbia

## 2023-06-23 NOTE — H&P (Signed)
History and Physical    Patient: Brooke Rush YIR:[id number] DOB: Oct 20, 1975 DOA: 06/23/2023 DOS: the patient was seen and examined on 06/23/2023 PCP: Katrinka Blazing, MD  Patient coming from: Home  Chief Complaint:  Chief Complaint  Patient presents with   Abdominal Pain   HPI: [name] is a 48 y.o. female with medical history significant of panniculectomy at [facility] by Dr. Meredith Mody on 06/05/2023.  Patient seems to have done well till yesterday when she reports a new onset of purulence in the middle of her wound, see photo below.  Associated with fever as high as 102 F.  Patient unfortunately did not report to the ER yesterday and came today.  Patient denies any other associated symptoms such as vomiting diarrhea chest pain shortness of breath coughing any other rash on skin.  Patient reports since yesterday having an occipital area headache and visual blurring/blackening of visual fields in the medial aspect of both eyes. Onset since yesterday am. Denies trauma. No vomting. Review of Systems: As mentioned in the history of present illness. All other systems reviewed and are negative. Past Medical History:  Diagnosis Date   Arthritis    in her neck   Constipation    2/2 pain medication   Fibromyalgia    Diagnosed by Dr. Darrick Huntsman   GERD (gastroesophageal reflux disease)    Kidney stones    Migraines    Past Surgical History:  Procedure Laterality Date   CARPAL TUNNEL RELEASE Bilateral 1998   CHOLECYSTECTOMY  2011   ETHMOIDECTOMY Bilateral 12/02/2015   Procedure: LEFT TOTAL ETHMOIDECTOMY  RIGHT  REVISION TOTAL ETHMOIDECTOMY;  Surgeon: Bud Face, MD;  Location: [facility];  Service: ENT;  Laterality: Bilateral;   FRONTAL SINUS EXPLORATION Bilateral 12/02/2015   Procedure: FRONTAL SINUSOTOMY;  Surgeon: Bud Face, MD;  Location: [facility];  Service: ENT;  Laterality: Bilateral;   HEMORROIDECTOMY     Dr. Katrinka Blazing   IMAGE GUIDED SINUS  SURGERY N/A 12/02/2015   Procedure: IMAGE GUIDED SINUS SURGERY;  Surgeon: Bud Face, MD;  Location: [facility];  Service: ENT;  Laterality: N/A;  GAVE DISK TO [facility] 6/22 DEE GEL FOAM PATCH STRYKER   LEEP     MAXILLARY ANTROSTOMY Left 12/02/2015   Procedure: MAXILLARY ANTROSTOMY;  Surgeon: Bud Face, MD;  Location: [facility];  Service: ENT;  Laterality: Left;   NASAL SEPTUM SURGERY  2011   NASAL SINUS SURGERY  2011   Dr. Andee Poles   REMOVAL OF EAR TUBE Bilateral 12/02/2015   Procedure: Bilateral Tube removal with Gel Film Myringoplasty;  Surgeon: Bud Face, MD;  Location: [facility];  Service: ENT;  Laterality: Bilateral;   TONSILLECTOMY AND ADENOIDECTOMY  2013   Social History:  reports that she quit smoking about 7 years ago. She started smoking about 28 years ago. She has a 21 pack-year smoking history. She has never used smokeless tobacco. She reports that she does not drink alcohol and does not use drugs.  Allergies  Allergen Reactions   Latex Hives   Other Anaphylaxis    Paper mask   Nsaids Nausea And Vomiting    Acid reflux    Family History  Problem Relation Age of Onset   Arthritis Mother        Degenerative Disc Dz   Obesity Mother    Alcohol abuse Father    Hyperlipidemia Father    Heart disease Father        CAD - CABG  age 48   Kidney disease Maternal Grandmother    Heart disease Maternal Grandmother    Heart disease Maternal Grandfather    Hyperlipidemia Maternal Grandfather    Hypertension Maternal Grandfather     Prior to Admission medications   Medication Sig Start Date End Date Taking? Authorizing Provider  albuterol (PROVENTIL HFA) 108 (90 Base) MCG/ACT inhaler Inhale into the lungs. 03/10/17 06/22/24 Yes [provider]  cetirizine HCl (ZYRTEC) 1 MG/ML solution Take 10 mg by mouth daily.   Yes [provider]  docusate sodium (COLACE) 100 MG capsule Take 200 mg by mouth daily.   Yes [provider]  fluticasone (FLONASE) 50 MCG/ACT nasal spray Place 1 spray into both nostrils daily.   Yes [provider]  HYDROcodone-acetaminophen (NORCO/VICODIN) 5-325 MG tablet Take 1 tablet by mouth every 6 (six) hours as needed for moderate pain (pain score 4-6).   Yes [provider]  LORazepam (ATIVAN) 1 MG tablet Take 1 mg by mouth daily as needed for anxiety.   Yes [provider]  ondansetron (ZOFRAN) 4 MG tablet Take 4 mg by mouth every 8 (eight) hours as needed for nausea or vomiting.   Yes [provider]  oxyCODONE-acetaminophen (PERCOCET/ROXICET) 5-325 MG tablet Take 1 tablet by mouth every 6 (six) hours as needed for severe pain (pain score 7-10).   Yes [provider]  Phenylephrine-APAP-guaiFENesin (TYLENOL SINUS SEVERE PO) Take 1 tablet by mouth every 6 (six) hours as needed (URI symptoms).   Yes [provider]  QUEtiapine (SEROQUEL) 50 MG tablet Take 50 mg by mouth. 06/08/20 06/22/24 Yes [provider]  simethicone (MYLICON) 80 MG chewable tablet Chew 80 mg by mouth every 6 (six) hours as needed for flatulence.   Yes [provider]  tiZANidine (ZANAFLEX) 4 MG capsule Take 4 mg by mouth 3 (three) times daily as needed for muscle spasms.   Yes [provider]  White Petrolatum-Mineral Oil (SYSTANE NIGHTTIME OP) Apply 1 drop to eye at bedtime.   Yes [provider]    Physical Exam: Vitals:   06/23/23 1700 06/23/23 1719 06/23/23 1738 06/23/23 2107  BP: 115/76  108/61 (!) 97/50  Pulse: (!) 106 (!) 110 (!) 111 (!) 106  Resp: 20  13 19   Temp:  99.6 F (37.6 C)  97.9 F (36.6 C)  TempSrc:  Oral  Oral  SpO2: 97% 97% 97% 95%  Weight:      Height:       General: Obese appearing lady no immediate distress.  Alert awake oriented x 3 Respiratory; bilateral intravesicular Cardiovascular exam S1-S2 normal Abdomen there is some tenderness diffusely but no guarding or rebound.  There is a  midline incision as well as a low horizontal incision.  Most of the incision is well opposed.  However there is some dehiscence at the lower middle section.  See photo where there is purulence noted. Lower extremities: Warm without edema no distal function impairment. No visual feield defect by confrontation testing.   Media Information  Document Information  Photos  Abdominal wound  06/23/2023 21:34  Attached To:  Hospital Encounter on 06/23/23  Source Information  Nolberto Hanlon, MD  [facility]  Document History    Data Reviewed:  Labs on Admission:  Results for orders placed or performed during the hospital encounter of 06/23/23 (from the past 24 hours)  Comprehensive metabolic panel     Status: Abnormal   Collection Time: 06/23/23  2:12 PM  Result  Value Ref Range   Sodium 136 135 - 145 mmol/L   Potassium 3.5 3.5 - 5.1 mmol/L   Chloride 103 98 - 111 mmol/L   CO2 24 22 - 32 mmol/L   Glucose, Bld 160 (H) 70 - 99 mg/dL   BUN 6 6 - 20 mg/dL   Creatinine, Ser 6.04 0.44 - 1.00 mg/dL   Calcium 8.7 (L) 8.9 - 10.3 mg/dL   Total Protein 6.6 6.5 - 8.1 g/dL   Albumin 3.2 (L) 3.5 - 5.0 g/dL   AST 14 (L) 15 - 41 U/L   ALT 10 0 - 44 U/L   Alkaline Phosphatase 60 38 - 126 U/L   Total Bilirubin 0.7 0.0 - 1.2 mg/dL   GFR, Estimated >54 >09 mL/min   Anion gap 9 5 - 15  Lipase, blood     Status: None   Collection Time: 06/23/23  2:12 PM  Result Value Ref Range   Lipase 20 11 - 51 U/L  CBC with Diff     Status: Abnormal   Collection Time: 06/23/23  2:12 PM  Result Value Ref Range   WBC 12.4 (H) 4.0 - 10.5 K/uL   RBC 4.09 3.87 - 5.11 MIL/uL   Hemoglobin 13.1 12.0 - 15.0 g/dL   HCT 81.1 91.4 - 78.2 %   MCV 94.6 80.0 - 100.0 fL   MCH 32.0 26.0 - 34.0 pg   MCHC 33.9 30.0 - 36.0 g/dL   RDW 95.6 21.3 - 08.6 %   Platelets 329 150 - 400 K/uL   nRBC 0.0 0.0 - 0.2 %   Neutrophils Relative % 79 %   Neutro Abs 9.8 (H) 1.7 - 7.7 K/uL   Lymphocytes Relative 14 %   Lymphs Abs 1.7 0.7  - 4.0 K/uL   Monocytes Relative 6 %   Monocytes Absolute 0.7 0.1 - 1.0 K/uL   Eosinophils Relative 1 %   Eosinophils Absolute 0.1 0.0 - 0.5 K/uL   Basophils Relative 0 %   Basophils Absolute 0.0 0.0 - 0.1 K/uL   Immature Granulocytes 0 %   Abs Immature Granulocytes 0.05 0.00 - 0.07 K/uL  hCG, serum, qualitative     Status: None   Collection Time: 06/23/23  2:12 PM  Result Value Ref Range   Preg, Serum NEGATIVE NEGATIVE  I-Stat Lactic Acid, ED     Status: None   Collection Time: 06/23/23  2:20 PM  Result Value Ref Range   Lactic Acid, Venous 1.7 0.5 - 1.9 mmol/L  I-Stat venous blood gas, ED     Status: Abnormal   Collection Time: 06/23/23  2:21 PM  Result Value Ref Range   pH, Ven 6.930 (LL) 7.25 - 7.43   pCO2, Ven 93.3 (HH) 44 - 60 mmHg   pO2, Ven 58 (H) 32 - 45 mmHg   Bicarbonate 19.6 (L) 20.0 - 28.0 mmol/L   TCO2 22 22 - 32 mmol/L   O2 Saturation 67 %   Acid-base deficit 13.0 (H) 0.0 - 2.0 mmol/L   Sodium 138 135 - 145 mmol/L   Potassium 7.7 (HH) 3.5 - 5.1 mmol/L   Calcium, Ion 1.34 1.15 - 1.40 mmol/L   HCT 23.0 (L) 36.0 - 46.0 %   Hemoglobin 7.8 (L) 12.0 - 15.0 g/dL   Sample type VENOUS    Comment NOTIFIED PHYSICIAN   Urinalysis, Routine w reflex microscopic -Urine, Clean Catch     Status: Abnormal   Collection Time: 06/23/23  2:59 PM  Result Value Ref  Range   Color, Urine YELLOW YELLOW   APPearance TURBID (A) CLEAR   Specific Gravity, Urine 1.035 (H) 1.005 - 1.030   pH 5.0 5.0 - 8.0   Glucose, UA NEGATIVE NEGATIVE mg/dL   Hgb urine dipstick NEGATIVE NEGATIVE   Bilirubin Urine NEGATIVE NEGATIVE   Ketones, ur NEGATIVE NEGATIVE mg/dL   Protein, ur 30 (A) NEGATIVE mg/dL   Nitrite NEGATIVE NEGATIVE   Leukocytes,Ua NEGATIVE NEGATIVE   RBC / HPF 0-5 0 - 5 RBC/hpf   WBC, UA 0-5 0 - 5 WBC/hpf   Bacteria, UA RARE (A) NONE SEEN   Squamous Epithelial / HPF 11-20 0 - 5 /HPF   Mucus PRESENT    Amorphous Crystal PRESENT   D-dimer, quantitative     Status: None   Collection  Time: 06/23/23  2:59 PM  Result Value Ref Range   D-Dimer, Quant 0.43 0.00 - 0.50 ug/mL-FEU  I-Stat venous blood gas, ([facility] ED, [facility], [facility])     Status: Abnormal   Collection Time: 06/23/23  4:04 PM  Result Value Ref Range   pH, Ven 7.448 (H) 7.25 - 7.43   pCO2, Ven 40.0 (L) 44 - 60 mmHg   pO2, Ven 27 (LL) 32 - 45 mmHg   Bicarbonate 27.6 20.0 - 28.0 mmol/L   TCO2 29 22 - 32 mmol/L   O2 Saturation 53 %   Acid-Base Excess 3.0 (H) 0.0 - 2.0 mmol/L   Sodium 136 135 - 145 mmol/L   Potassium 6.1 (H) 3.5 - 5.1 mmol/L   Calcium, Ion 0.98 (L) 1.15 - 1.40 mmol/L   HCT 36.0 36.0 - 46.0 %   Hemoglobin 12.2 12.0 - 15.0 g/dL   Sample type VENOUS    Comment NOTIFIED PHYSICIAN   I-stat chem 8, ED (not at [facility], [facility] or [facility])     Status: Abnormal   Collection Time: 06/23/23  4:04 PM  Result Value Ref Range   Sodium 136 135 - 145 mmol/L   Potassium 6.0 (H) 3.5 - 5.1 mmol/L   Chloride 105 98 - 111 mmol/L   BUN 7 6 - 20 mg/dL   Creatinine, Ser 0.27 0.44 - 1.00 mg/dL   Glucose, Bld 253 (H) 70 - 99 mg/dL   Calcium, Ion 6.64 (L) 1.15 - 1.40 mmol/L   TCO2 25 22 - 32 mmol/L   Hemoglobin 12.2 12.0 - 15.0 g/dL   HCT 40.3 47.4 - 25.9 %  Resp panel by RT-PCR (RSV, Flu A&B, Covid) Anterior Nasal Swab     Status: None   Collection Time: 06/23/23  5:38 PM   Specimen: Anterior Nasal Swab  Result Value Ref Range   SARS Coronavirus 2 by RT PCR NEGATIVE NEGATIVE   Influenza A by PCR NEGATIVE NEGATIVE   Influenza B by PCR NEGATIVE NEGATIVE   Resp Syncytial Virus by PCR NEGATIVE NEGATIVE   Basic Metabolic Panel: Recent Labs  Lab 06/23/23 1412 06/23/23 1421 06/23/23 1604  NA 136 138 136  136  K 3.5 7.7* 6.1*  6.0*  CL 103  --  105  CO2 24  --   --   GLUCOSE 160*  --  108*  BUN 6  --  7  CREATININE 0.81  --  0.60  CALCIUM 8.7*  --   --    Liver Function Tests: Recent Labs  Lab 06/23/23 1412  AST 14*  ALT 10  ALKPHOS 60  BILITOT 0.7  PROT 6.6  ALBUMIN 3.2*   Recent Labs  Lab 06/23/23 1412  LIPASE 20   No results for input(s): "AMMONIA" in the last 168 hours. CBC: Recent Labs  Lab 06/23/23 1412 06/23/23 1421 06/23/23 1604  WBC 12.4*  --   --   NEUTROABS 9.8*  --   --   HGB 13.1 7.8* 12.2  12.2  HCT 38.7 23.0* 36.0  36.0  MCV 94.6  --   --   PLT 329  --   --    Cardiac Enzymes: No results for input(s): "CKTOTAL", "CKMB", "CKMBINDEX", "TROPONINIHS" in the last 168 hours.  BNP (last 3 results) No results for input(s): "PROBNP" in the last 8760 hours. CBG: No results for input(s): "GLUCAP" in the last 168 hours.  Radiological Exams on Admission:  CT ABDOMEN PELVIS W CONTRAST Result Date: 06/23/2023 CLINICAL DATA:  Abdomen pain EXAM: CT ABDOMEN AND PELVIS WITH CONTRAST TECHNIQUE: Multidetector CT imaging of the abdomen and pelvis was performed using the standard protocol following bolus administration of intravenous contrast. RADIATION DOSE REDUCTION: This exam was performed according to the departmental dose-optimization program which includes automated exposure control, adjustment of the mA and/or kV according to patient size and/or use of iterative reconstruction technique. CONTRAST:  75mL OMNIPAQUE IOHEXOL 350 MG/ML SOLN COMPARISON:  None Available. FINDINGS: Lower chest: Lung bases demonstrate no acute airspace disease. Hepatobiliary: Hepatic steatosis. Cholecystectomy. No biliary dilatation Pancreas: Unremarkable. No pancreatic ductal dilatation or surrounding inflammatory changes. Spleen: Normal in size without focal abnormality. Adrenals/Urinary Tract: Adrenal glands are unremarkable. Kidneys are normal, without renal calculi, focal lesion, or hydronephrosis. Bladder is unremarkable. Stomach/Bowel: Stomach is within normal limits. Appendix appears normal. No evidence of bowel wall thickening, distention, or inflammatory changes. Vascular/Lymphatic: No significant vascular findings are present. No enlarged abdominal or pelvic lymph nodes. Reproductive: Uterus and  bilateral adnexa are unremarkable. Other: Negative for pelvic effusion or free air. Extensive stranding and edema within the subcutaneous soft tissues of the anterior abdominal wall with fluid collection superficial to the rectus, this measures 15 mm maximum AP thickness over the right rectus and 15 mm maximum thickness over the left rectus and extends over a horizontal diameter of 20 cm. No strong rim enhancement. No internal gas. Few clips in the region. Soft tissue wound or defect along the midline inferior 21 abdominal wall Nonspecific mild stranding within the mesentery root with small nodes. Musculoskeletal: No acute or suspicious osseous abnormality IMPRESSION: 1. Extensive stranding and edema within the subcutaneous soft tissues of the anterior abdominal wall with fluid collection superficial to the rectus muscles measuring 15 mm maximum AP thickness over the right rectus and 15 mm maximum thickness over the left rectus and extends over a horizontal diameter of 20 cm. No strong rim enhancement. No internal gas. Findings could be secondary to postoperative change and postoperative fluid collection, but infection could also produce this appearance. 2. Hepatic steatosis. Electronically Signed   By: Jasmine Pang M.D.   On: 06/23/2023 19:02   DG Abd Portable 1V Result Date: 06/23/2023 CLINICAL DATA:  Pain EXAM: PORTABLE ABDOMEN - 1 VIEW COMPARISON:  06/15/2007 FINDINGS: The bowel gas pattern is normal. Moderate volume stool throughout the colon. Cholecystectomy clips. No radio-opaque calculi or other significant radiographic abnormality are seen. IMPRESSION: Nonobstructive bowel gas pattern. Moderate volume stool throughout the colon. Electronically Signed   By: Duanne Guess D.O.   On: 06/23/2023 15:32     Assessment and Plan: Sepsis (HCC) Evident as leukocytosis as well as tachycardia.  Source of infection is felt to be wound infection of patient's prior surgical  site.  With associated inflammation  in the subcutaneous area.  There are abdominal wall fluid collection superficial to the rectus muscle seen on CAT scan measuring 15 mm over the right rectus muscle.  And 15 mm over the left rectus muscle.  This was reviewed by the surgeon Dr. Meredith Mody.  And at this time antibiotic therapy is suggested.  I do feel that the collection is in communication with superficial skin due to the wound dehiscence site as it is visible on the photo here.  At this time continue with vancomycin and Zosyn.  Wound cultures ordered.  Patient's blood pressure is soft.  Additional 1 L bolus ordered.  Patient declined transfer to [facility] at this time.  I will put in an IR consult to see if they are able to drain any collection.  Visual field defects Not noted on conforntation exam. Check mri brain      Advance Care Planning:   Code Status: Full Code   Consults: IR consult in AM  Family Communication: son at bedside. All questions answered.  Severity of Illness: The appropriate patient status for this patient is INPATIENT. Inpatient status is judged to be reasonable and necessary in order to provide the required intensity of service to ensure the patient's safety. The patient's presenting symptoms, physical exam findings, and initial radiographic and laboratory data in the context of their chronic comorbidities is felt to place them at high risk for further clinical deterioration. Furthermore, it is not anticipated that the patient will be medically stable for discharge from the hospital within 2 midnights of admission.   * I certify that at the point of admission it is my clinical judgment that the patient will require inpatient hospital care spanning beyond 2 midnights from the point of admission due to high intensity of service, high risk for further deterioration and high frequency of surveillance required.*  Author: Nolberto Hanlon, MD 06/23/2023 10:10 PM  For on call review [email or web address].

## 2023-06-23 NOTE — Assessment & Plan Note (Signed)
On admission. Present on admission. Evident as leukocytosis as well as tachycardia.  Source of infection is felt to be wound infection of patient's prior surgical site.  With associated inflammation in the subcutaneous area.  There are abdominal wall fluid collection superficial to the rectus muscle seen on CAT scan measuring 15 mm over the right rectus muscle.  And 15 mm over the left rectus muscle.  This was reviewed by the surgeon Dr. Meredith Mody.  And at this time antibiotic therapy is suggested.  I do feel that the collection is in communication with superficial skin due to the wound dehiscence site as it is visible on the photo here.  At this time continue with vancomycin and Zosyn.  Wound cultures ordered.  Patient's blood pressure is soft.  Additional 1 L bolus ordered.  Patient declined transfer to [facility] at this time.  I will put in an IR consult to see if they are able to drain any collection. 06-24-2023 continue to monitor.  06-25-2023 present on admission, due to post-op wound infection. WBC normalized now.

## 2023-06-23 NOTE — Assessment & Plan Note (Signed)
On admission, Not noted on conforntation exam. Check mri brain  06-24-2023 MRI brain negative for CVA.

## 2023-06-24 ENCOUNTER — Other Ambulatory Visit: Payer: Self-pay

## 2023-06-24 ENCOUNTER — Inpatient Hospital Stay (HOSPITAL_COMMUNITY): Payer: BC Managed Care – PPO

## 2023-06-24 DIAGNOSIS — L7634 Postprocedural seroma of skin and subcutaneous tissue following other procedure: Secondary | ICD-10-CM

## 2023-06-24 DIAGNOSIS — E66812 Obesity, class 2: Secondary | ICD-10-CM | POA: Diagnosis not present

## 2023-06-24 DIAGNOSIS — A419 Sepsis, unspecified organism: Secondary | ICD-10-CM | POA: Diagnosis not present

## 2023-06-24 DIAGNOSIS — T8149XA Infection following a procedure, other surgical site, initial encounter: Secondary | ICD-10-CM | POA: Insufficient documentation

## 2023-06-24 DIAGNOSIS — T148XXA Other injury of unspecified body region, initial encounter: Secondary | ICD-10-CM | POA: Diagnosis not present

## 2023-06-24 HISTORY — PX: IR US GUIDANCE: IMG2393

## 2023-06-24 LAB — CBC
HCT: 33 % — ABNORMAL LOW (ref 36.0–46.0)
Hemoglobin: 10.7 g/dL — ABNORMAL LOW (ref 12.0–15.0)
MCH: 31.8 pg (ref 26.0–34.0)
MCHC: 32.4 g/dL (ref 30.0–36.0)
MCV: 97.9 fL (ref 80.0–100.0)
Platelets: 288 10*3/uL (ref 150–400)
RBC: 3.37 MIL/uL — ABNORMAL LOW (ref 3.87–5.11)
RDW: 12.3 % (ref 11.5–15.5)
WBC: 10 10*3/uL (ref 4.0–10.5)
nRBC: 0 % (ref 0.0–0.2)

## 2023-06-24 LAB — PROTIME-INR
INR: 1.3 — ABNORMAL HIGH (ref 0.8–1.2)
Prothrombin Time: 16.1 s — ABNORMAL HIGH (ref 11.4–15.2)

## 2023-06-24 LAB — BASIC METABOLIC PANEL
Anion gap: 7 (ref 5–15)
BUN: 6 mg/dL (ref 6–20)
CO2: 21 mmol/L — ABNORMAL LOW (ref 22–32)
Calcium: 7.8 mg/dL — ABNORMAL LOW (ref 8.9–10.3)
Chloride: 105 mmol/L (ref 98–111)
Creatinine, Ser: 0.73 mg/dL (ref 0.44–1.00)
GFR, Estimated: >60 mL/min (ref >60)
Glucose, Bld: 176 mg/dL — ABNORMAL HIGH (ref 70–99)
Potassium: 3.3 mmol/L — ABNORMAL LOW (ref 3.5–5.1)
Sodium: 133 mmol/L — ABNORMAL LOW (ref 135–145)

## 2023-06-24 LAB — HIV ANTIBODY (ROUTINE TESTING W REFLEX): HIV Screen 4th Generation wRfx: NONREACTIVE

## 2023-06-24 LAB — APTT: aPTT: 35 s (ref 24–36)

## 2023-06-24 MED ORDER — FENTANYL CITRATE (PF) 100 MCG/2ML IJ SOLN
INTRAMUSCULAR | Status: AC | PRN
  Administered 2023-06-24 (×2): 50 ug via INTRAVENOUS

## 2023-06-24 MED ORDER — OXYCODONE-ACETAMINOPHEN 5-325 MG PO TABS
1.0000 | ORAL_TABLET | ORAL | Status: DC | PRN
  Administered 2023-06-24: 1 via ORAL
  Administered 2023-06-24 – 2023-06-25 (×4): 2 via ORAL
  Administered 2023-06-25: 1 via ORAL
  Administered 2023-06-26 – 2023-06-27 (×4): 2 via ORAL
  Filled 2023-06-24 (×8): qty 2
  Filled 2023-06-24 (×2): qty 1

## 2023-06-24 MED ORDER — VANCOMYCIN HCL 750 MG IV SOLR
750.0000 mg | Freq: Two times a day (BID) | INTRAVENOUS | Status: DC
  Administered 2023-06-24: 750 mg via INTRAVENOUS
  Filled 2023-06-24 (×5): qty 15

## 2023-06-24 MED ORDER — SODIUM CHLORIDE 0.9% FLUSH
5.0000 mL | Freq: Three times a day (TID) | INTRAVENOUS | Status: DC
  Administered 2023-06-24 – 2023-06-27 (×10): 5 mL

## 2023-06-24 MED ORDER — LORATADINE 10 MG PO TABS
10.0000 mg | ORAL_TABLET | Freq: Every day | ORAL | Status: DC
  Administered 2023-06-26 – 2023-06-27 (×2): 10 mg via ORAL
  Filled 2023-06-24 (×3): qty 1

## 2023-06-24 MED ORDER — LORAZEPAM 1 MG PO TABS
1.0000 mg | ORAL_TABLET | Freq: Two times a day (BID) | ORAL | Status: DC | PRN
  Administered 2023-06-24 – 2023-06-26 (×3): 1 mg via ORAL
  Filled 2023-06-24 (×4): qty 1

## 2023-06-24 MED ORDER — GABAPENTIN 100 MG PO CAPS
100.0000 mg | ORAL_CAPSULE | Freq: Four times a day (QID) | ORAL | Status: DC | PRN
  Administered 2023-06-24 (×2): 100 mg via ORAL
  Filled 2023-06-24 (×2): qty 1

## 2023-06-24 MED ORDER — LIDOCAINE HCL 1 % IJ SOLN
20.0000 mL | Freq: Once | INTRAMUSCULAR | Status: AC
  Administered 2023-06-24: 10 mL

## 2023-06-24 MED ORDER — FENTANYL CITRATE (PF) 100 MCG/2ML IJ SOLN
INTRAMUSCULAR | Status: AC
  Filled 2023-06-24: qty 2

## 2023-06-24 MED ORDER — LIDOCAINE HCL 1 % IJ SOLN
INTRAMUSCULAR | Status: AC
  Filled 2023-06-24: qty 20

## 2023-06-24 NOTE — Hospital Course (Signed)
HPI: Brooke Rush is a 48 y.o. female with medical history significant of panniculectomy at [facility] by Dr. Meredith Mody on 06/05/2023.  Patient seems to have done well till yesterday when she reports a new onset of purulence in the middle of her wound, see photo below.  Associated with fever as high as 102 F.  Patient unfortunately did not report to the ER yesterday and came today.  Patient denies any other associated symptoms such as vomiting diarrhea chest pain shortness of breath coughing any other rash on skin.   Patient reports since yesterday having an occipital area headache and visual blurring/blackening of visual fields in the medial aspect of both eyes. Onset since yesterday am. Denies trauma. No vomting.  Significant Events: Admitted 06/23/2023 with post-wound infection   Significant Labs: WBC 12.4, Hgb 13.1, plt 329, Na 136, K 3.5, BUN 6, scr 0.81  Significant Imaging Studies: CT abd  Extensive stranding and edema within the subcutaneous soft tissues of the anterior abdominal wall with fluid collection superficial to the rectus muscles measuring 15 mm maximum AP thickness over the right rectus and 15 mm maximum thickness over the left rectus and extends over a horizontal diameter of 20 cm. No strong rim enhancement. No internal gas. Findings could be secondary to postoperative change and postoperative fluid collection, but infection could also produce this appearance. 2. Hepatic steatosis.  Antibiotic Therapy: Anti-infectives (From admission, onward)    Start     Dose/Rate Route Frequency Ordered Stop   06/24/23 0800  vancomycin (VANCOREADY) IVPB 750 mg/150 mL  Status:  Discontinued        750 mg 150 mL/hr over 60 Minutes Intravenous Every 12 hours 06/23/23 2303 06/23/23 2315   06/24/23 0800  vancomycin (VANCOCIN) 750 mg in sodium chloride 0.9 % 250 mL IVPB        750 mg 250 mL/hr over 60 Minutes Intravenous Every 12 hours 06/23/23 2315     06/24/23 0100  piperacillin-tazobactam  (ZOSYN) IVPB 3.375 g        3.375 g 12.5 mL/hr over 240 Minutes Intravenous Every 8 hours 06/23/23 2303     06/23/23 1700  vancomycin (VANCOCIN) IVPB 1000 mg/200 mL premix  Status:  Discontinued        1,000 mg 200 mL/hr over 60 Minutes Intravenous  Once 06/23/23 1649 06/23/23 1658   06/23/23 1700  piperacillin-tazobactam (ZOSYN) IVPB 3.375 g        3.375 g 100 mL/hr over 30 Minutes Intravenous  Once 06/23/23 1649 06/23/23 1818   06/23/23 1700  Vancomycin (VANCOCIN) 1,500 mg in sodium chloride 0.9 % 500 mL IVPB        1,500 mg 250 mL/hr over 120 Minutes Intravenous  Once 06/23/23 1658 06/23/23 2223       Procedures:   Consultants: ID

## 2023-06-24 NOTE — H&P (Signed)
Chief Complaint:  Abdominal pain/postop abdominal fluid collections  Referring Provider(s): Goel,H  Supervising Physician: Roanna Banning  Patient Status: [facility] - ED  History of Present Illness: Brooke Rush is a 48 y.o. female with PMH sig for arthritis, obesity, fibromyalgia, GERD, renal stones, migraines who is s/p panniculectomy at [facility] by Dr. Meredith Mody on 06/05/2023. She presented to [facility] ED on 1/17 with abd pain and drainage from abd wound site along with fever. She also c/o HA (hx migraines). WBC 10, hgb 10.7, plts nl, creat nl, PT/INR 16.1/1.3. Wound cx pend. Pt on vancomycin, zosyn. MRI brain neg. CT A/P revealed:  1. Extensive stranding and edema within the subcutaneous soft tissues of the anterior abdominal wall with fluid collection superficial to the rectus muscles measuring 15 mm maximum AP thickness over the right rectus and 15 mm maximum thickness over the left rectus and extends over a horizontal diameter of 20 cm. No strong rim enhancement. No internal gas. Findings could be secondary to postoperative change and postoperative fluid collection, but infection could also produce this appearance. 2. Hepatic steatosis  Request now received for image guided aspiration/drainage of abd fluid collections.    Patient is Full Code  Past Medical History:  Diagnosis Date   Arthritis    in her neck   Constipation    2/2 pain medication   Fibromyalgia    Diagnosed by Dr. Darrick Huntsman   GERD (gastroesophageal reflux disease)    Kidney stones    Migraines     Past Surgical History:  Procedure Laterality Date   CARPAL TUNNEL RELEASE Bilateral 1998   CHOLECYSTECTOMY  2011   ETHMOIDECTOMY Bilateral 12/02/2015   Procedure: LEFT TOTAL ETHMOIDECTOMY  RIGHT  REVISION TOTAL ETHMOIDECTOMY;  Surgeon: Bud Face, MD;  Location: [facility];  Service: ENT;  Laterality: Bilateral;   FRONTAL SINUS EXPLORATION Bilateral 12/02/2015   Procedure: FRONTAL SINUSOTOMY;  Surgeon:  Bud Face, MD;  Location: [facility];  Service: ENT;  Laterality: Bilateral;   HEMORROIDECTOMY     Dr. Katrinka Blazing   IMAGE GUIDED SINUS SURGERY N/A 12/02/2015   Procedure: IMAGE GUIDED SINUS SURGERY;  Surgeon: Bud Face, MD;  Location: [facility];  Service: ENT;  Laterality: N/A;  GAVE DISK TO [facility] 6/22 DEE GEL FOAM PATCH STRYKER   LEEP     MAXILLARY ANTROSTOMY Left 12/02/2015   Procedure: MAXILLARY ANTROSTOMY;  Surgeon: Bud Face, MD;  Location: [facility];  Service: ENT;  Laterality: Left;   NASAL SEPTUM SURGERY  2011   NASAL SINUS SURGERY  2011   Dr. Andee Poles   REMOVAL OF EAR TUBE Bilateral 12/02/2015   Procedure: Bilateral Tube removal with Gel Film Myringoplasty;  Surgeon: Bud Face, MD;  Location: [facility];  Service: ENT;  Laterality: Bilateral;   TONSILLECTOMY AND ADENOIDECTOMY  2013    Allergies: Latex, Other, and Nsaids  Medications: Prior to Admission medications   Medication Sig Start Date End Date Taking? Authorizing Provider  albuterol (PROVENTIL HFA) 108 (90 Base) MCG/ACT inhaler Inhale into the lungs. 03/10/17 06/22/24 Yes [provider]  cetirizine HCl (ZYRTEC) 1 MG/ML solution Take 10 mg by mouth daily.   Yes [provider]  docusate sodium (COLACE) 100 MG capsule Take 200 mg by mouth daily.   Yes [provider]  fluticasone (FLONASE) 50 MCG/ACT nasal spray Place 1 spray into both nostrils daily.   Yes [provider]  HYDROcodone-acetaminophen (NORCO/VICODIN) 5-325 MG tablet Take 1 tablet by mouth every 6 (six)  hours as needed for moderate pain (pain score 4-6).   Yes [provider]  LORazepam (ATIVAN) 1 MG tablet Take 1 mg by mouth daily as needed for anxiety.   Yes [provider]  ondansetron (ZOFRAN) 4 MG tablet Take 4 mg by mouth every 8 (eight) hours as needed for nausea or vomiting.   Yes [provider]  oxyCODONE-acetaminophen  (PERCOCET/ROXICET) 5-325 MG tablet Take 1 tablet by mouth every 6 (six) hours as needed for severe pain (pain score 7-10).   Yes [provider]  Phenylephrine-APAP-guaiFENesin (TYLENOL SINUS SEVERE PO) Take 1 tablet by mouth every 6 (six) hours as needed (URI symptoms).   Yes [provider]  QUEtiapine (SEROQUEL) 50 MG tablet Take 50 mg by mouth. 06/08/20 06/22/24 Yes [provider]  simethicone (MYLICON) 80 MG chewable tablet Chew 80 mg by mouth every 6 (six) hours as needed for flatulence.   Yes [provider]  tiZANidine (ZANAFLEX) 4 MG capsule Take 4 mg by mouth 3 (three) times daily as needed for muscle spasms.   Yes [provider]  White Petrolatum-Mineral Oil (SYSTANE NIGHTTIME OP) Apply 1 drop to eye at bedtime.   Yes [provider]     Family History  Problem Relation Age of Onset   Arthritis Mother        Degenerative Disc Dz   Obesity Mother    Alcohol abuse Father    Hyperlipidemia Father    Heart disease Father        CAD - CABG age 91   Kidney disease Maternal Grandmother    Heart disease Maternal Grandmother    Heart disease Maternal Grandfather    Hyperlipidemia Maternal Grandfather    Hypertension Maternal Grandfather     Social History   Socioeconomic History   Marital status: Married    Spouse name: Not on file   Number of children: 3   Years of education: 14   Highest education level: Not on file  Occupational History   Occupation: Lawyer for Systems developer: nurse care of [place]    Comment: Nurse [employer] [place]  Tobacco Use   Smoking status: Former    Current packs/day: 0.00    Average packs/day: 1 pack/day for 21.0 years (21.0 ttl pk-yrs)    Types: Cigarettes    Start date: 06/05/1995    Quit date: 06/04/2016    Years since quitting: 7.0   Smokeless tobacco: Never  Substance and Sexual Activity   Alcohol use: No    Alcohol/week: 0.0 standard drinks of alcohol    Comment: 1-2  drinks twice a week/not during pregnancy   Drug use: No   Sexual activity: Yes    Partners: Male    Comment: Boyfriend fixed  Other Topics Concern   Not on file  Social History Narrative   Ellee grew up in [place], [place]. She is living at home with her boyfriend and one of her children. She also has a 59 y/o daughter who lives with her aunt. Hynlee also had a baby girl that passed away in [id number] (76 days old). 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 Ka enjoys crafting on her spare time.   Social Drivers of Corporate investment banker Strain: Low Risk  (03/06/2023)   Received from [facility]   Overall Financial Resource Strain (CARDIA)  Difficulty of Paying Living Expenses: Not very hard  Food Insecurity: Food Insecurity Present (03/06/2023)   Received from [facility]   Hunger Vital Sign    Worried About Running Out of Food in the Last Year: Sometimes true    Ran Out of Food in the Last Year: Sometimes true  Transportation Needs: No Transportation Needs (03/06/2023)   Received from [facility] - Transportation    Lack of Transportation (Medical): No    Lack of Transportation (Non-Medical): No  Physical Activity: Not on file  Stress: No Stress Concern Present (05/08/2023)   Received from [facility] Occup[facility] - Occupational Stress Questionnaire    Feeling of Stress : Not at all  Social Connections: Not on file      Review of Systems; see above; has HA, diffuse abd discomfort, occ back pain, she is anxious; denies CP,dyspnea, cough, N/V or bleeding  Vital Signs: BP 97/64   Pulse (!) 102   Temp 98.5 F (36.9 C) (Oral)   Resp 17   Ht 5\' 4"  (1.626 m)   Wt 215 lb (97.5 kg)   SpO2 98%   BMI 36.90 kg/m   Advance Care Plan: no documents on file  Physical Exam: awake/alert; chest- CTA bilat; heart- sl tachy but reg rhythm; abd- few  BS, diffuse tenderness to palpation, vertical/horizontal incisions with some dehiscence at the lower middle section. No LE edema  Imaging: MR BRAIN WO CONTRAST Result Date: 06/24/2023 CLINICAL DATA:  Stroke suspected EXAM: MRI HEAD WITHOUT CONTRAST TECHNIQUE: Multiplanar, multiecho pulse sequences of the brain and surrounding structures were obtained without intravenous contrast. COMPARISON:  None Available. FINDINGS: Brain: No acute infarct, mass effect or extra-axial collection. No acute or chronic hemorrhage. Normal white matter signal, parenchymal volume and CSF spaces. The midline structures are normal. Vascular: Normal flow voids. Skull and upper cervical spine: Normal calvarium and skull base. Visualized upper cervical spine and soft tissues are normal. Sinuses/Orbits:No paranasal sinus fluid levels or advanced mucosal thickening. No mastoid or middle ear effusion. Normal orbits. IMPRESSION: Normal brain MRI. Electronically Signed   By: Deatra Robinson M.D.   On: 06/24/2023 02:51   CT ABDOMEN PELVIS W CONTRAST Result Date: 06/23/2023 CLINICAL DATA:  Abdomen pain EXAM: CT ABDOMEN AND PELVIS WITH CONTRAST TECHNIQUE: Multidetector CT imaging of the abdomen and pelvis was performed using the standard protocol following bolus administration of intravenous contrast. RADIATION DOSE REDUCTION: This exam was performed according to the departmental dose-optimization program which includes automated exposure control, adjustment of the mA and/or kV according to patient size and/or use of iterative reconstruction technique. CONTRAST:  75mL OMNIPAQUE IOHEXOL 350 MG/ML SOLN COMPARISON:  None Available. FINDINGS: Lower chest: Lung bases demonstrate no acute airspace disease. Hepatobiliary: Hepatic steatosis. Cholecystectomy. No biliary dilatation Pancreas: Unremarkable. No pancreatic ductal dilatation or surrounding inflammatory changes. Spleen: Normal in size without focal abnormality. Adrenals/Urinary Tract: Adrenal  glands are unremarkable. Kidneys are normal, without renal calculi, focal lesion, or hydronephrosis. Bladder is unremarkable. Stomach/Bowel: Stomach is within normal limits. Appendix appears normal. No evidence of bowel wall thickening, distention, or inflammatory changes. Vascular/Lymphatic: No significant vascular findings are present. No enlarged abdominal or pelvic lymph nodes. Reproductive: Uterus and bilateral adnexa are unremarkable. Other: Negative for pelvic effusion or free air. Extensive stranding and edema within the subcutaneous soft tissues of the anterior abdominal wall with fluid collection superficial to the rectus, this measures 15 mm maximum AP thickness over the right rectus  and 15 mm maximum thickness over the left rectus and extends over a horizontal diameter of 20 cm. No strong rim enhancement. No internal gas. Few clips in the region. Soft tissue wound or defect along the midline inferior 21 abdominal wall Nonspecific mild stranding within the mesentery root with small nodes. Musculoskeletal: No acute or suspicious osseous abnormality IMPRESSION: 1. Extensive stranding and edema within the subcutaneous soft tissues of the anterior abdominal wall with fluid collection superficial to the rectus muscles measuring 15 mm maximum AP thickness over the right rectus and 15 mm maximum thickness over the left rectus and extends over a horizontal diameter of 20 cm. No strong rim enhancement. No internal gas. Findings could be secondary to postoperative change and postoperative fluid collection, but infection could also produce this appearance. 2. Hepatic steatosis. Electronically Signed   By: Jasmine Pang M.D.   On: 06/23/2023 19:02   DG Abd Portable 1V Result Date: 06/23/2023 CLINICAL DATA:  Pain EXAM: PORTABLE ABDOMEN - 1 VIEW COMPARISON:  06/15/2007 FINDINGS: The bowel gas pattern is normal. Moderate volume stool throughout the colon. Cholecystectomy clips. No radio-opaque calculi or other  significant radiographic abnormality are seen. IMPRESSION: Nonobstructive bowel gas pattern. Moderate volume stool throughout the colon. Electronically Signed   By: Duanne Guess D.O.   On: 06/23/2023 15:32    Labs:  CBC: Recent Labs    06/23/23 1412 06/23/23 1421 06/23/23 1604 06/24/23 0413  WBC 12.4*  --   --  10.0  HGB 13.1 7.8* 12.2  12.2 10.7*  HCT 38.7 23.0* 36.0  36.0 33.0*  PLT 329  --   --  288    COAGS: Recent Labs    06/24/23 0413  INR 1.3*  APTT 35    BMP: Recent Labs    06/23/23 1412 06/23/23 1421 06/23/23 1604 06/24/23 0413  NA 136 138 136  136 133*  K 3.5 7.7* 6.1*  6.0* 3.3*  CL 103  --  105 105  CO2 24  --   --  21*  GLUCOSE 160*  --  108* 176*  BUN 6  --  7 6  CALCIUM 8.7*  --   --  7.8*  CREATININE 0.81  --  0.60 0.73  GFRNONAA >60  --   --  >60    LIVER FUNCTION TESTS: Recent Labs    06/23/23 1412  BILITOT 0.7  AST 14*  ALT 10  ALKPHOS 60  PROT 6.6  ALBUMIN 3.2*    TUMOR MARKERS: No results for input(s): "AFPTM", "CEA", "CA199", "CHROMGRNA" in the last 8760 hours.  Assessment and Plan: 48 y.o. female with PMH sig for arthritis, obesity, fibromyalgia, GERD, renal stones, migraines who is s/p panniculectomy at [facility] by Dr. Meredith Mody on 06/05/2023. She presented to [facility] ED on 1/17 with abd pain and drainage from abd wound site along with fever. She also c/o HA (hx migraines). WBC 10, hgb 10.7, plts nl, creat nl, PT/INR 16.1/1.3. Wound cx pend. Pt on vancomycin, zosyn. MRI brain neg. CT A/P revealed:  1. Extensive stranding and edema within the subcutaneous soft tissues of the anterior abdominal wall with fluid collection superficial to the rectus muscles measuring 15 mm maximum AP thickness over the right rectus and 15 mm maximum thickness over the left rectus and extends over a horizontal diameter of 20 cm. No strong rim enhancement. No internal gas. Findings could be secondary to postoperative change and postoperative fluid  collection, but infection could also produce this appearance. 2. Hepatic steatosis  Request now received for image guided aspiration/drainage of abd fluid collections. Imaging studies have been reviewed by Dr. Milford Cage.Risks and benefits discussed with the patient including bleeding, infection, damage to adjacent structures, bowel perforation/fistula connection, and sepsis.  All of the patient's questions were answered, patient is agreeable to proceed. Consent signed and in chart.  Procedure scheduled for today; pt did have breakfast this am  Thank you for allowing our service to participate in Brooke Rush 's care.  Electronically Signed: D. Jeananne Rama, PA-C   06/24/2023, 9:00 AM      I spent a total of 40 minutes    in face to face in clinical consultation, greater than 50% of which was counseling/coordinating care for image guided aspiration/drainage of abdominal fluid collections

## 2023-06-24 NOTE — Progress Notes (Signed)
PROGRESS NOTE    Brooke Rush  UJW:[id number] DOB: [birth year] DOA: 06/23/2023 PCP: Katrinka Blazing, MD  Subjective: Pt seen and examined. Has lost about 60 lbs. Had initial liposuction and then panniculectomy. Done by Kennyth Arnold.  Went to [facility] today. Had JP drain placed. About 60 ml in JP bulb. Looks like seroma fluid. Gram stain shows a few gram variable rods.  On IV Zosyn/Vanco for now.    Hospital Course: HPI: Brooke Rush is a 48 y.o. female with medical history significant of panniculectomy at [facility] by Dr. Meredith Mody on 06/05/2023.  Patient seems to have done well till yesterday when she reports a new onset of purulence in the middle of her wound, see photo below.  Associated with fever as high as 102 F.  Patient unfortunately did not report to the ER yesterday and came today.  Patient denies any other associated symptoms such as vomiting diarrhea chest pain shortness of breath coughing any other rash on skin.   Patient reports since yesterday having an occipital area headache and visual blurring/blackening of visual fields in the medial aspect of both eyes. Onset since yesterday am. Denies trauma. No vomting.  Significant Events: Admitted 06/23/2023 with post-wound infection   Significant Labs: WBC 12.4, Hgb 13.1, plt 329, Na 136, K 3.5, BUN 6, scr 0.81  Significant Imaging Studies: CT abd  Extensive stranding and edema within the subcutaneous soft tissues of the anterior abdominal wall with fluid collection superficial to the rectus muscles measuring 15 mm maximum AP thickness over the right rectus and 15 mm maximum thickness over the left rectus and extends over a horizontal diameter of 20 cm. No strong rim enhancement. No internal gas. Findings could be secondary to postoperative change and postoperative fluid collection, but infection could also produce this appearance. 2. Hepatic steatosis.  Antibiotic Therapy: Anti-infectives (From admission,  onward)    Start     Dose/Rate Route Frequency Ordered Stop   06/24/23 0800  vancomycin (VANCOREADY) IVPB 750 mg/150 mL  Status:  Discontinued        750 mg 150 mL/hr over 60 Minutes Intravenous Every 12 hours 06/23/23 2303 06/23/23 2315   06/24/23 0800  vancomycin (VANCOCIN) 750 mg in sodium chloride 0.9 % 250 mL IVPB        750 mg 250 mL/hr over 60 Minutes Intravenous Every 12 hours 06/23/23 2315     06/24/23 0100  piperacillin-tazobactam (ZOSYN) IVPB 3.375 g        3.375 g 12.5 mL/hr over 240 Minutes Intravenous Every 8 hours 06/23/23 2303     06/23/23 1700  vancomycin (VANCOCIN) IVPB 1000 mg/200 mL premix  Status:  Discontinued        1,000 mg 200 mL/hr over 60 Minutes Intravenous  Once 06/23/23 1649 06/23/23 1658   06/23/23 1700  piperacillin-tazobactam (ZOSYN) IVPB 3.375 g        3.375 g 100 mL/hr over 30 Minutes Intravenous  Once 06/23/23 1649 06/23/23 1818   06/23/23 1700  Vancomycin (VANCOCIN) 1,500 mg in sodium chloride 0.9 % 500 mL IVPB        1,500 mg 250 mL/hr over 120 Minutes Intravenous  Once 06/23/23 1658 06/23/23 2223       Procedures:   Consultants:     Assessment and Plan: * Wound infection 06-24-2023 see postoperative seroma. Continue with IV zosyn/vanco for now.  Postoperative seroma of subcutaneous tissue after non-dermatologic procedure 06-24-2023 s/p IR placed drain. Has already had about 60  ml in JP bulb. Gram stain shows gram variable rods. Continue with zosyn and vanco for now. Pt asking if PICC line with long term IV abx were an option. I told her that final culture data needed to be obtained for making that kind of decision. Increase percocet to 5-10 mg q4h prn.  Sepsis (HCC) On admission. Present on admission. Evident as leukocytosis as well as tachycardia.  Source of infection is felt to be wound infection of patient's prior surgical site.  With associated inflammation in the subcutaneous area.  There are abdominal wall fluid collection  superficial to the rectus muscle seen on CAT scan measuring 15 mm over the right rectus muscle.  And 15 mm over the left rectus muscle.  This was reviewed by the surgeon Dr. Meredith Mody.  And at this time antibiotic therapy is suggested.  I do feel that the collection is in communication with superficial skin due to the wound dehiscence site as it is visible on the photo here.  At this time continue with vancomycin and Zosyn.  Wound cultures ordered.  Patient's blood pressure is soft.  Additional 1 L bolus ordered.  Patient declined transfer to [facility] at this time.  I will put in an IR consult to see if they are able to drain any collection.  06-24-2023 continue to monitor.  Visual field defects On admission, Not noted on conforntation exam. Check mri brain  06-24-2023 MRI brain negative for CVA.  Obesity, Class II, BMI 35-39.9 06-24-2023 BMI 36.9   DVT prophylaxis: enoxaparin (LOVENOX) injection 40 mg Start: 06/24/23 0800 SCDs Start: 06/23/23 2022    Code Status: Full Code Family Communication: no family at bedside. Pt is decisional Disposition Plan: return home Reason for continuing need for hospitalization: continue with IV ABX.  Objective: Vitals:   06/24/23 1040 06/24/23 1045 06/24/23 1050 06/24/23 1158  BP: 94/81 107/77 94/60   Pulse: (!) 104 (!) 104 (!) 107   Resp: 20 16 20    Temp:    98.5 F (36.9 C)  TempSrc:    Oral  SpO2: 99% 98% 99%   Weight:      Height:        Intake/Output Summary (Last 24 hours) at 06/24/2023 1310 Last data filed at 06/24/2023 1215 Gross per 24 hour  Intake 1048 ml  Output 250 ml  Net 798 ml   Filed Weights   06/23/23 1403  Weight: 97.5 kg    Examination:  Physical Exam Vitals and nursing note reviewed.  Constitutional:      General: She is not in acute distress.    Appearance: She is obese. She is not toxic-appearing.  HENT:     Head: Normocephalic and atraumatic.     Nose: Nose normal.  Eyes:     General: No scleral  icterus. Cardiovascular:     Rate and Rhythm: Normal rate and regular rhythm.  Pulmonary:     Effort: Pulmonary effort is normal.     Breath sounds: Normal breath sounds.  Abdominal:     General: Bowel sounds are normal. There is no distension.  Skin:    General: Skin is warm and dry.     Comments: JP bulb filled with about 60 ml of yellow, serous fluid. Not purulent.  Neurological:     Mental Status: She is alert.     Data Reviewed: I have personally reviewed following labs and imaging studies  CBC: Recent Labs  Lab 06/23/23 1412 06/23/23 1421 06/23/23 1604 06/24/23 0413  WBC 12.4*  --   --  10.0  NEUTROABS 9.8*  --   --   --   HGB 13.1 7.8* 12.2  12.2 10.7*  HCT 38.7 23.0* 36.0  36.0 33.0*  MCV 94.6  --   --  97.9  PLT 329  --   --  288   Basic Metabolic Panel: Recent Labs  Lab 06/23/23 1412 06/23/23 1421 06/23/23 1604 06/24/23 0413  NA 136 138 136  136 133*  K 3.5 7.7* 6.1*  6.0* 3.3*  CL 103  --  105 105  CO2 24  --   --  21*  GLUCOSE 160*  --  108* 176*  BUN 6  --  7 6  CREATININE 0.81  --  0.60 0.73  CALCIUM 8.7*  --   --  7.8*   GFR: Estimated Creatinine Clearance: 99.6 mL/min (by C-G formula based on SCr of 0.73 mg/dL). Liver Function Tests: Recent Labs  Lab 06/23/23 1412  AST 14*  ALT 10  ALKPHOS 60  BILITOT 0.7  PROT 6.6  ALBUMIN 3.2*   Recent Labs  Lab 06/23/23 1412  LIPASE 20   Coagulation Profile: Recent Labs  Lab 06/24/23 0413  INR 1.3*   Sepsis Labs: Recent Labs  Lab 06/23/23 1420  LATICACIDVEN 1.7    Recent Results (from the past 240 hours)  Resp panel by RT-PCR (RSV, Flu A&B, Covid) Anterior Nasal Swab     Status: None   Collection Time: 06/23/23  5:38 PM   Specimen: Anterior Nasal Swab  Result Value Ref Range Status   SARS Coronavirus 2 by RT PCR NEGATIVE NEGATIVE Final   Influenza A by PCR NEGATIVE NEGATIVE Final   Influenza B by PCR NEGATIVE NEGATIVE Final    Comment: (NOTE) The Xpert Xpress  SARS-CoV-2/FLU/RSV plus assay is intended as an aid in the diagnosis of influenza from Nasopharyngeal swab specimens and should not be used as a sole basis for treatment. Nasal washings and aspirates are unacceptable for Xpert Xpress SARS-CoV-2/FLU/RSV testing.  Fact Sheet for Patients: BloggerCourse.com  Fact Sheet for Healthcare Providers: SeriousBroker.it  This test is not yet approved or cleared by the [place] FDA and has been authorized for detection and/or diagnosis of SARS-CoV-2 by FDA under an Emergency Use Authorization (EUA). This EUA will remain in effect (meaning this test can be used) for the duration of the COVID-19 declaration under Section 564(b)(1) of the Act, 21 U.S.C. section 360bbb-3(b)(1), unless the authorization is terminated or revoked.     Resp Syncytial Virus by PCR NEGATIVE NEGATIVE Final    Comment: (NOTE) Fact Sheet for Patients: BloggerCourse.com  Fact Sheet for Healthcare Providers: SeriousBroker.it  This test is not yet approved or cleared by the [place] FDA and has been authorized for detection and/or diagnosis of SARS-CoV-2 by FDA under an Emergency Use Authorization (EUA). This EUA will remain in effect (meaning this test can be used) for the duration of the COVID-19 declaration under Section 564(b)(1) of the Act, 21 U.S.C. section 360bbb-3(b)(1), unless the authorization is terminated or revoked.  Performed at [facility], [address], [place], [place] [address]   Aerobic/Anaerobic Culture w Gram Stain (surgical/deep wound)     Status: None (Preliminary result)   Collection Time: 06/23/23 11:25 PM   Specimen: Wound  Result Value Ref Range Status   Specimen Description WOUND  Final   Special Requests ABDOMEN  Final   Gram Stain   Final    FEW WBC  PRESENT,BOTH PMN AND MONONUCLEAR FEW GRAM VARIABLE ROD Performed at  [facility], [address] [place], [place] [address]    Culture PENDING  Incomplete   Report Status PENDING  Incomplete     Radiology Studies: MR BRAIN WO CONTRAST Result Date: 06/24/2023 CLINICAL DATA:  Stroke suspected EXAM: MRI HEAD WITHOUT CONTRAST TECHNIQUE: Multiplanar, multiecho pulse sequences of the brain and surrounding structures were obtained without intravenous contrast. COMPARISON:  None Available. FINDINGS: Brain: No acute infarct, mass effect or extra-axial collection. No acute or chronic hemorrhage. Normal white matter signal, parenchymal volume and CSF spaces. The midline structures are normal. Vascular: Normal flow voids. Skull and upper cervical spine: Normal calvarium and skull base. Visualized upper cervical spine and soft tissues are normal. Sinuses/Orbits:No paranasal sinus fluid levels or advanced mucosal thickening. No mastoid or middle ear effusion. Normal orbits. IMPRESSION: Normal brain MRI. Electronically Signed   By: Deatra Robinson M.D.   On: 06/24/2023 02:51   CT ABDOMEN PELVIS W CONTRAST Result Date: 06/23/2023 CLINICAL DATA:  Abdomen pain EXAM: CT ABDOMEN AND PELVIS WITH CONTRAST TECHNIQUE: Multidetector CT imaging of the abdomen and pelvis was performed using the standard protocol following bolus administration of intravenous contrast. RADIATION DOSE REDUCTION: This exam was performed according to the departmental dose-optimization program which includes automated exposure control, adjustment of the mA and/or kV according to patient size and/or use of iterative reconstruction technique. CONTRAST:  75mL OMNIPAQUE IOHEXOL 350 MG/ML SOLN COMPARISON:  None Available. FINDINGS: Lower chest: Lung bases demonstrate no acute airspace disease. Hepatobiliary: Hepatic steatosis. Cholecystectomy. No biliary dilatation Pancreas: Unremarkable. No pancreatic ductal dilatation or surrounding inflammatory changes. Spleen: Normal in size without focal abnormality.  Adrenals/Urinary Tract: Adrenal glands are unremarkable. Kidneys are normal, without renal calculi, focal lesion, or hydronephrosis. Bladder is unremarkable. Stomach/Bowel: Stomach is within normal limits. Appendix appears normal. No evidence of bowel wall thickening, distention, or inflammatory changes. Vascular/Lymphatic: No significant vascular findings are present. No enlarged abdominal or pelvic lymph nodes. Reproductive: Uterus and bilateral adnexa are unremarkable. Other: Negative for pelvic effusion or free air. Extensive stranding and edema within the subcutaneous soft tissues of the anterior abdominal wall with fluid collection superficial to the rectus, this measures 15 mm maximum AP thickness over the right rectus and 15 mm maximum thickness over the left rectus and extends over a horizontal diameter of 20 cm. No strong rim enhancement. No internal gas. Few clips in the region. Soft tissue wound or defect along the midline inferior 21 abdominal wall Nonspecific mild stranding within the mesentery root with small nodes. Musculoskeletal: No acute or suspicious osseous abnormality IMPRESSION: 1. Extensive stranding and edema within the subcutaneous soft tissues of the anterior abdominal wall with fluid collection superficial to the rectus muscles measuring 15 mm maximum AP thickness over the right rectus and 15 mm maximum thickness over the left rectus and extends over a horizontal diameter of 20 cm. No strong rim enhancement. No internal gas. Findings could be secondary to postoperative change and postoperative fluid collection, but infection could also produce this appearance. 2. Hepatic steatosis. Electronically Signed   By: Jasmine Pang M.D.   On: 06/23/2023 19:02   DG Abd Portable 1V Result Date: 06/23/2023 CLINICAL DATA:  Pain EXAM: PORTABLE ABDOMEN - 1 VIEW COMPARISON:  06/15/2007 FINDINGS: The bowel gas pattern is normal. Moderate volume stool throughout the colon. Cholecystectomy clips. No  radio-opaque calculi or other significant radiographic abnormality are seen. IMPRESSION: Nonobstructive bowel gas pattern. Moderate volume stool throughout the  colon. Electronically Signed   By: Duanne Guess D.O.   On: 06/23/2023 15:32    Scheduled Meds:  docusate sodium  200 mg Oral Daily   enoxaparin (LOVENOX) injection  40 mg Subcutaneous Q24H   [START ON 06/25/2023] fluticasone  1 spray Each Nare Daily   QUEtiapine  50 mg Oral QHS   sodium chloride flush  3 mL Intravenous Q12H   sodium chloride flush  5 mL Intracatheter Q8H   Continuous Infusions:  sodium chloride 125 mL/hr at 06/23/23 2141   piperacillin-tazobactam (ZOSYN)  IV 3.375 g (06/24/23 0929)   vancomycin       LOS: 1 day   Time spent: 40 minutes  Carollee Herter, DO  [facility]  06/24/2023, 1:10 PM

## 2023-06-24 NOTE — Assessment & Plan Note (Addendum)
06-24-2023 s/p IR placed drain. Has already had about 60 ml in JP bulb. Gram stain shows gram variable rods. Continue with zosyn and vanco for now. Pt asking if PICC line with long term IV abx were an option. I told her that final culture data needed to be obtained for making that kind of decision. Increase percocet to 5-10 mg q4h prn. 06-25-2023 increase neurontin to 100-300 mg q6h prn.  prelim cultures growing pseudomonas. Changed IV abx to cefepime. Stopped vanco. Continued flagyl. 06-26-2023 pain controlled with oxy and neurontin. ID consulted for E. Coli and pseudomonas wound infection.  06-27-2023 continue with Cipro 500 mg twice a day plus cefadroxil 1000 mg twice a day for 10 days per ID recs. Continue with NS flushes to the drain. F/u with IR clinic. I'm going to reach out to Dr. Meredith Mody to see if she has any recs.  *update. I was contacted by Dr. Meredith Mody. Gave her update on her patient. She will f/u with patient in clinic next week.

## 2023-06-24 NOTE — Subjective & Objective (Signed)
Pt seen and examined. Pt seen by ID. They recommended cipro and duricef for 10 days.  IR will arrange for outpatient f/u appointment for drain management.

## 2023-06-24 NOTE — Plan of Care (Signed)

## 2023-06-24 NOTE — ED Notes (Signed)
Pt taken to IR

## 2023-06-24 NOTE — Assessment & Plan Note (Signed)
06-24-2023 see postoperative seroma. Continue with IV zosyn/vanco for now. 06-25-2023 prelim cultures growing pseudomonas. Changed IV abx to cefepime. Stopped vanco. Continued flagyl. 06-26-2023 wound cx growing E. Coli and Pseudomonas. ID consulted.  06-27-2023 ID recs Cipro 500 mg twice a day plus cefadroxil 1000 mg twice a day for 10 days . Pt will need f/u with plastic surgery, IR after discharge.

## 2023-06-24 NOTE — Procedures (Signed)
Vascular and Interventional Radiology Procedure Note  Patient: Brooke Rush DOB: 1976/02/01 Medical Record Number: [id number] Note Date/Time: 06/24/23 9:09 AM   Performing Physician: Roanna Banning, MD Assistant(s): None  Diagnosis: Panniculectomy w supeficial abdominal collections.  Procedure: DRAINAGE CATHETER PLACEMENT into LOWER ABDOMINAL SUPERFICIAL FLUID COLLECTION  Anesthesia: Conscious Sedation Complications: None Estimated Blood Loss: Minimal Specimens: Sent for Gram Stain, Aerobe Culture, and Anerobe Culture  Findings:  Successful Ultrasound and Fluoroscopy-guided placement of 12 F catheter into the R lower abdomen.  Plan:  - Flush drain with 5 mL Normal Saline every 8 hours. - Follow up drain evaluation / sinogram in 7-10 day(s).  See detailed procedure note with images in PACS. The patient tolerated the procedure well without incident or complication and was returned to Recovery in stable condition.    Roanna Banning, MD [facility] Rad[facility]   Pager. [phone] Clinic. [phone]

## 2023-06-24 NOTE — Assessment & Plan Note (Signed)
06-24-2023 BMI 36.9

## 2023-06-25 DIAGNOSIS — T8149XA Infection following a procedure, other surgical site, initial encounter: Secondary | ICD-10-CM | POA: Diagnosis not present

## 2023-06-25 DIAGNOSIS — E66812 Obesity, class 2: Secondary | ICD-10-CM | POA: Diagnosis not present

## 2023-06-25 DIAGNOSIS — T148XXA Other injury of unspecified body region, initial encounter: Secondary | ICD-10-CM | POA: Diagnosis not present

## 2023-06-25 DIAGNOSIS — A419 Sepsis, unspecified organism: Secondary | ICD-10-CM | POA: Diagnosis not present

## 2023-06-25 LAB — CBC WITH DIFFERENTIAL/PLATELET
Abs Immature Granulocytes: 0.04 10*3/uL (ref 0.00–0.07)
Basophils Absolute: 0 10*3/uL (ref 0.0–0.1)
Basophils Relative: 1 %
Eosinophils Absolute: 0.1 10*3/uL (ref 0.0–0.5)
Eosinophils Relative: 1 %
HCT: 33.8 % — ABNORMAL LOW (ref 36.0–46.0)
Hemoglobin: 11 g/dL — ABNORMAL LOW (ref 12.0–15.0)
Immature Granulocytes: 1 %
Lymphocytes Relative: 21 %
Lymphs Abs: 1.6 10*3/uL (ref 0.7–4.0)
MCH: 31.1 pg (ref 26.0–34.0)
MCHC: 32.5 g/dL (ref 30.0–36.0)
MCV: 95.5 fL (ref 80.0–100.0)
Monocytes Absolute: 0.5 10*3/uL (ref 0.1–1.0)
Monocytes Relative: 7 %
Neutro Abs: 5.4 10*3/uL (ref 1.7–7.7)
Neutrophils Relative %: 69 %
Platelets: 321 10*3/uL (ref 150–400)
RBC: 3.54 MIL/uL — ABNORMAL LOW (ref 3.87–5.11)
RDW: 12.3 % (ref 11.5–15.5)
WBC: 7.7 10*3/uL (ref 4.0–10.5)
nRBC: 0 % (ref 0.0–0.2)

## 2023-06-25 LAB — COMPREHENSIVE METABOLIC PANEL
ALT: 12 U/L (ref 0–44)
AST: 12 U/L — ABNORMAL LOW (ref 15–41)
Albumin: 2.6 g/dL — ABNORMAL LOW (ref 3.5–5.0)
Alkaline Phosphatase: 52 U/L (ref 38–126)
Anion gap: 6 (ref 5–15)
BUN: 7 mg/dL (ref 6–20)
CO2: 27 mmol/L (ref 22–32)
Calcium: 8.3 mg/dL — ABNORMAL LOW (ref 8.9–10.3)
Chloride: 106 mmol/L (ref 98–111)
Creatinine, Ser: 0.75 mg/dL (ref 0.44–1.00)
GFR, Estimated: >60 mL/min (ref >60)
Glucose, Bld: 119 mg/dL — ABNORMAL HIGH (ref 70–99)
Potassium: 4.4 mmol/L (ref 3.5–5.1)
Sodium: 139 mmol/L (ref 135–145)
Total Bilirubin: 0.4 mg/dL (ref 0.0–1.2)
Total Protein: 6 g/dL — ABNORMAL LOW (ref 6.5–8.1)

## 2023-06-25 MED ORDER — METRONIDAZOLE 500 MG/100ML IV SOLN
500.0000 mg | Freq: Two times a day (BID) | INTRAVENOUS | Status: DC
  Administered 2023-06-25 – 2023-06-27 (×5): 500 mg via INTRAVENOUS
  Filled 2023-06-25 (×5): qty 100

## 2023-06-25 MED ORDER — POLYVINYL ALCOHOL 1.4 % OP SOLN
2.0000 [drp] | OPHTHALMIC | Status: DC | PRN
  Filled 2023-06-25: qty 15

## 2023-06-25 MED ORDER — GABAPENTIN 100 MG PO CAPS
100.0000 mg | ORAL_CAPSULE | Freq: Four times a day (QID) | ORAL | Status: DC | PRN
  Administered 2023-06-25: 200 mg via ORAL
  Administered 2023-06-25: 300 mg via ORAL
  Administered 2023-06-25: 100 mg via ORAL
  Administered 2023-06-26 (×2): 300 mg via ORAL
  Administered 2023-06-26: 100 mg via ORAL
  Filled 2023-06-25: qty 3
  Filled 2023-06-25: qty 1
  Filled 2023-06-25: qty 3
  Filled 2023-06-25: qty 2
  Filled 2023-06-25: qty 3
  Filled 2023-06-25: qty 1

## 2023-06-25 MED ORDER — SODIUM CHLORIDE 0.9 % IV SOLN
2.0000 g | Freq: Three times a day (TID) | INTRAVENOUS | Status: DC
  Administered 2023-06-25 – 2023-06-27 (×7): 2 g via INTRAVENOUS
  Filled 2023-06-25 (×7): qty 12.5

## 2023-06-25 MED ORDER — SIMETHICONE 80 MG PO CHEW
160.0000 mg | CHEWABLE_TABLET | Freq: Four times a day (QID) | ORAL | Status: DC | PRN
  Administered 2023-06-25: 160 mg via ORAL
  Filled 2023-06-25 (×2): qty 2

## 2023-06-25 MED ORDER — SENNOSIDES-DOCUSATE SODIUM 8.6-50 MG PO TABS
2.0000 | ORAL_TABLET | Freq: Every day | ORAL | Status: DC
  Administered 2023-06-25 – 2023-06-26 (×2): 2 via ORAL
  Filled 2023-06-25 (×2): qty 2

## 2023-06-25 MED ORDER — BISACODYL 5 MG PO TBEC
10.0000 mg | DELAYED_RELEASE_TABLET | Freq: Once | ORAL | Status: AC
  Administered 2023-06-25: 10 mg via ORAL
  Filled 2023-06-25: qty 2

## 2023-06-25 NOTE — Progress Notes (Signed)
Referring Physician(s): Goel,H  Supervising Physician: Roanna Banning  Patient Status:  [facility] - In-pt  Chief Complaint: Abdominal pain/postop abdominal fluid collection   Subjective: Pt feeling better since abd drain placed yesterday; occ nausea; currently eating breakfast   Allergies: Latex, Other, and Nsaids  Medications: Prior to Admission medications   Medication Sig Start Date End Date Taking? Authorizing Provider  albuterol (PROVENTIL HFA) 108 (90 Base) MCG/ACT inhaler Inhale into the lungs. 03/10/17 06/22/24 Yes [provider]  cetirizine HCl (ZYRTEC) 1 MG/ML solution Take 10 mg by mouth daily.   Yes [provider]  docusate sodium (COLACE) 100 MG capsule Take 200 mg by mouth daily.   Yes [provider]  fluticasone (FLONASE) 50 MCG/ACT nasal spray Place 1 spray into both nostrils daily.   Yes [provider]  HYDROcodone-acetaminophen (NORCO/VICODIN) 5-325 MG tablet Take 1 tablet by mouth every 6 (six) hours as needed for moderate pain (pain score 4-6).   Yes [provider]  LORazepam (ATIVAN) 1 MG tablet Take 1 mg by mouth daily as needed for anxiety.   Yes [provider]  ondansetron (ZOFRAN) 4 MG tablet Take 4 mg by mouth every 8 (eight) hours as needed for nausea or vomiting.   Yes [provider]  oxyCODONE-acetaminophen (PERCOCET/ROXICET) 5-325 MG tablet Take 1 tablet by mouth every 6 (six) hours as needed for severe pain (pain score 7-10).   Yes [provider]  Phenylephrine-APAP-guaiFENesin (TYLENOL SINUS SEVERE PO) Take 1 tablet by mouth every 6 (six) hours as needed (URI symptoms).   Yes [provider]  QUEtiapine (SEROQUEL) 50 MG tablet Take 50 mg by mouth. 06/08/20 06/22/24 Yes [provider]  simethicone (MYLICON) 80 MG chewable tablet Chew 80 mg by mouth every 6 (six) hours as needed for flatulence.   Yes [provider]  tiZANidine (ZANAFLEX) 4 MG capsule Take  4 mg by mouth 3 (three) times daily as needed for muscle spasms.   Yes [provider]  White Petrolatum-Mineral Oil (SYSTANE NIGHTTIME OP) Apply 1 drop to eye at bedtime.   Yes [provider]     Vital Signs: BP 107/65 (BP Location: Left Arm)   Pulse 97   Temp 98.4 F (36.9 C) (Oral)   Resp 14   Ht 5\' 4"  (1.626 m)   Wt 222 lb 3.6 oz (100.8 kg)   SpO2 94%   BMI 38.14 kg/m   Physical Exam awake/alert; rt abd drain intact, insertion site ok, site mildly tender, OP 235 cc yesterday since placement turbid, serosang fluid; drain flushed without difficulty  Imaging: IR IMAGE GUIDED DRAINAGE BY PERCUTANEOUS CATHETER Result Date: 06/24/2023 CLINICAL DATA:  Panniculectomy with lower abdominal fluid collection EXAM: ULTRASOUND and FLUOROSCOPIC-GUIDED DRAINAGE CATHETER PLACEMENT into LOWER ABDOMINAL WALL FLUID COLLECTION COMPARISON:  CT AP, 06/23/2023 MEDICATIONS: The patient was on scheduled antibiotics. ANESTHESIA/SEDATION: Local anesthetic and single agent sedation was employed during this procedure. A total of fentanyl 100 mcg was administered intravenously. The patient's level of consciousness and vital signs were monitored continuously by radiology nursing throughout the procedure under my direct supervision. FLUOROSCOPY: Fluoroscopic dose; 1 mGy COMPLICATIONS: None immediate. PROCEDURE: Informed written consent was obtained from the patient and/or patient's representative after a discussion of the risks, benefits and alternatives to treatment. Pre-procedural ultrasound scanning demonstrated subcutaneous fluid collection at the lower abdomen, underlying the incision. A timeout was performed prior to the initiation of the procedure. The lower abdomen was prepped and draped in the usual sterile  fashion. The overlying soft tissues were anesthetized with 1% lidocaine with epinephrine. Under direct ultrasound guidance, a 18 gauge trocar needle was advanced, then a short Amplatz super  stiff wire was coiled within the collection. Ultrasound images were saved for procedural documentation purposes. Fluoroscopic evaluation of the wire demonstrated but the wire crossed the midline. The tract was serially dilated allowing placement of a 12 Fr drainage catheter. Final catheter positioning was confirmed with a fluoroscopic radiographic image. 10 mL of seromatous fluid was aspirated. A representative sample of aspirated fluid was capped and sent to the laboratory for analysis. The tube was connected to a bulb suction and sutured in place. A dressing was placed. The patient tolerated the procedure well without immediate post procedural complication. FINDINGS: Lower abdominal fluid collection in communication with the guidewire crossing the midline. A unilateral drainage catheter was therefore recommended. IMPRESSION: Successful [place] and fluoroscopic-guided drainage catheter placement into lower quadrant abdominal wall fluid collection. 10 mL of seromatous fluid was aspirated and a representative aspirated sample was sent to the laboratory as requested by the ordering clinical team. RECOMMENDATIONS: The patient will return to Vascular Interventional Radiology (VIR) for routine drainage catheter evaluation in 7-10 days. Roanna Banning, MD [facility] Radiology Electronically Signed   By: Roanna Banning M.D.   On: 06/24/2023 14:47   IR US Guidance Result Date: 06/24/2023 CLINICAL DATA:  Panniculectomy with lower abdominal fluid collection EXAM: ULTRASOUND and FLUOROSCOPIC-GUIDED DRAINAGE CATHETER PLACEMENT into LOWER ABDOMINAL WALL FLUID COLLECTION COMPARISON:  CT AP, 06/23/2023 MEDICATIONS: The patient was on scheduled antibiotics. ANESTHESIA/SEDATION: Local anesthetic and single agent sedation was employed during this procedure. A total of fentanyl 100 mcg was administered intravenously. The patient's level of consciousness and vital signs were monitored  continuously by radiology nursing throughout the procedure under my direct supervision. FLUOROSCOPY: Fluoroscopic dose; 1 mGy COMPLICATIONS: None immediate. PROCEDURE: Informed written consent was obtained from the patient and/or patient's representative after a discussion of the risks, benefits and alternatives to treatment. Pre-procedural ultrasound scanning demonstrated subcutaneous fluid collection at the lower abdomen, underlying the incision. A timeout was performed prior to the initiation of the procedure. The lower abdomen was prepped and draped in the usual sterile fashion. The overlying soft tissues were anesthetized with 1% lidocaine with epinephrine. Under direct ultrasound guidance, a 18 gauge trocar needle was advanced, then a short Amplatz super stiff wire was coiled within the collection. Ultrasound images were saved for procedural documentation purposes. Fluoroscopic evaluation of the wire demonstrated but the wire crossed the midline. The tract was serially dilated allowing placement of a 12 Fr drainage catheter. Final catheter positioning was confirmed with a fluoroscopic radiographic image. 10 mL of seromatous fluid was aspirated. A representative sample of aspirated fluid was capped and sent to the laboratory for analysis. The tube was connected to a bulb suction and sutured in place. A dressing was placed. The patient tolerated the procedure well without immediate post procedural complication. FINDINGS: Lower abdominal fluid collection in communication with the guidewire crossing the midline. A unilateral drainage catheter was therefore recommended. IMPRESSION: Successful [place] and fluoroscopic-guided drainage catheter placement into lower quadrant abdominal wall fluid collection. 10 mL of seromatous fluid was aspirated and a representative aspirated sample was sent to the laboratory as requested by the ordering clinical team. RECOMMENDATIONS: The patient will return to Vascular Interventional  Radiology (VIR) for routine drainage catheter evaluation in 7-10 days. Roanna Banning, MD [facility] Radiology Electronically Signed  By: Roanna Banning M.D.   On: 06/24/2023 14:47   MR BRAIN WO CONTRAST Result Date: 06/24/2023 CLINICAL DATA:  Stroke suspected EXAM: MRI HEAD WITHOUT CONTRAST TECHNIQUE: Multiplanar, multiecho pulse sequences of the brain and surrounding structures were obtained without intravenous contrast. COMPARISON:  None Available. FINDINGS: Brain: No acute infarct, mass effect or extra-axial collection. No acute or chronic hemorrhage. Normal white matter signal, parenchymal volume and CSF spaces. The midline structures are normal. Vascular: Normal flow voids. Skull and upper cervical spine: Normal calvarium and skull base. Visualized upper cervical spine and soft tissues are normal. Sinuses/Orbits:No paranasal sinus fluid levels or advanced mucosal thickening. No mastoid or middle ear effusion. Normal orbits. IMPRESSION: Normal brain MRI. Electronically Signed   By: Deatra Robinson M.D.   On: 06/24/2023 02:51   CT ABDOMEN PELVIS W CONTRAST Result Date: 06/23/2023 CLINICAL DATA:  Abdomen pain EXAM: CT ABDOMEN AND PELVIS WITH CONTRAST TECHNIQUE: Multidetector CT imaging of the abdomen and pelvis was performed using the standard protocol following bolus administration of intravenous contrast. RADIATION DOSE REDUCTION: This exam was performed according to the departmental dose-optimization program which includes automated exposure control, adjustment of the mA and/or kV according to patient size and/or use of iterative reconstruction technique. CONTRAST:  75mL OMNIPAQUE IOHEXOL 350 MG/ML SOLN COMPARISON:  None Available. FINDINGS: Lower chest: Lung bases demonstrate no acute airspace disease. Hepatobiliary: Hepatic steatosis. Cholecystectomy. No biliary dilatation Pancreas: Unremarkable. No pancreatic ductal dilatation or surrounding inflammatory  changes. Spleen: Normal in size without focal abnormality. Adrenals/Urinary Tract: Adrenal glands are unremarkable. Kidneys are normal, without renal calculi, focal lesion, or hydronephrosis. Bladder is unremarkable. Stomach/Bowel: Stomach is within normal limits. Appendix appears normal. No evidence of bowel wall thickening, distention, or inflammatory changes. Vascular/Lymphatic: No significant vascular findings are present. No enlarged abdominal or pelvic lymph nodes. Reproductive: Uterus and bilateral adnexa are unremarkable. Other: Negative for pelvic effusion or free air. Extensive stranding and edema within the subcutaneous soft tissues of the anterior abdominal wall with fluid collection superficial to the rectus, this measures 15 mm maximum AP thickness over the right rectus and 15 mm maximum thickness over the left rectus and extends over a horizontal diameter of 20 cm. No strong rim enhancement. No internal gas. Few clips in the region. Soft tissue wound or defect along the midline inferior 21 abdominal wall Nonspecific mild stranding within the mesentery root with small nodes. Musculoskeletal: No acute or suspicious osseous abnormality IMPRESSION: 1. Extensive stranding and edema within the subcutaneous soft tissues of the anterior abdominal wall with fluid collection superficial to the rectus muscles measuring 15 mm maximum AP thickness over the right rectus and 15 mm maximum thickness over the left rectus and extends over a horizontal diameter of 20 cm. No strong rim enhancement. No internal gas. Findings could be secondary to postoperative change and postoperative fluid collection, but infection could also produce this appearance. 2. Hepatic steatosis. Electronically Signed   By: Jasmine Pang M.D.   On: 06/23/2023 19:02   DG Abd Portable 1V Result Date: 06/23/2023 CLINICAL DATA:  Pain EXAM: PORTABLE ABDOMEN - 1 VIEW COMPARISON:  06/15/2007 FINDINGS: The bowel gas pattern is normal. Moderate volume  stool throughout the colon. Cholecystectomy clips. No radio-opaque calculi or other significant radiographic abnormality are seen. IMPRESSION: Nonobstructive bowel gas pattern. Moderate volume stool throughout the colon. Electronically Signed   By: Duanne Guess D.O.   On: 06/23/2023 15:32    Labs:  CBC: Recent Labs    06/23/23 1412 06/23/23 1421  06/23/23 1604 06/24/23 0413 06/25/23 0743  WBC 12.4*  --   --  10.0 7.7  HGB 13.1 7.8* 12.2  12.2 10.7* 11.0*  HCT 38.7 23.0* 36.0  36.0 33.0* 33.8*  PLT 329  --   --  288 321    COAGS: Recent Labs    06/24/23 0413  INR 1.3*  APTT 35    BMP: Recent Labs    06/23/23 1412 06/23/23 1421 06/23/23 1604 06/24/23 0413 06/25/23 0743  NA 136 138 136  136 133* 139  K 3.5 7.7* 6.1*  6.0* 3.3* 4.4  CL 103  --  105 105 106  CO2 24  --   --  21* 27  GLUCOSE 160*  --  108* 176* 119*  BUN 6  --  7 6 7   CALCIUM 8.7*  --   --  7.8* 8.3*  CREATININE 0.81  --  0.60 0.73 0.75  GFRNONAA >60  --   --  >60 >60    LIVER FUNCTION TESTS: Recent Labs    06/23/23 1412 06/25/23 0743  BILITOT 0.7 0.4  AST 14* 12*  ALT 10 12  ALKPHOS 60 52  PROT 6.6 6.0*  ALBUMIN 3.2* 2.6*    Assessment and Plan: 48 y.o. female with PMH sig for arthritis, obesity, fibromyalgia, GERD, renal stones, migraines who is s/p panniculectomy at [facility] by Dr. Meredith Mody on 06/05/2023. She presented to [facility] ED on 1/17 with abd pain and drainage from abd wound site along with fever. Imaging revealed lower abd superficial  fluid collection; s/p drain placement on 1/18; afebrile; WBC 7.7, hgb 11(10.7), creat nl; drain fl cx pend  Drain Location: RLQ Size: Fr size: 12 Fr Date of placement: 06/24/23  Currently to: Drain collection device: suction bulb 24 hour output:  Output by Drain (mL) 06/23/23 0701 - 06/23/23 1900 06/23/23 1901 - 06/24/23 0700 06/24/23 0701 - 06/24/23 1900 06/24/23 1901 - 06/25/23 0700 06/25/23 0701 - 06/25/23 0935  Closed System Drain 1 Right RLQ  Bulb (JP) 12 Fr.   150 85       Current examination: Flushes ok  Insertion site unremarkable. Suture in place. Dressed appropriately.   Plan: Continue TID flushes with 5 cc NS. Record output Q shift. Dressing changes QD or PRN if soiled.  Call IR APP or on call IR MD if difficulty flushing or sudden change in drain output.  Repeat imaging/possible drain injection once output < 10 mL/QD (excluding flush material). Consideration for drain removal if output is < 10 mL/QD (excluding flush material), pending discussion with the providing surgical service.  Discharge planning: Please contact IR APP or on call IR MD prior to patient d/c to ensure appropriate follow up plans are in place. Typically patient will follow up with IR clinic 10-14 days post d/c for repeat imaging/possible drain injection. IR scheduler will contact patient with date/time of appointment. Patient will need to flush drain QD with 5 cc NS, record output QD, dressing changes every 2-3 days or earlier if soiled.   IR will continue to follow - please call with questions or concerns.      Electronically Signed: D. Jeananne Rama, PA-C 06/25/2023, 9:28 AM   I spent a total of 15 Minutes at the the patient's bedside AND on the patient's hospital floor or unit, greater than 50% of which was counseling/coordinating care for abdominal fluid collection drain    Patient ID: Brooke Rush, female   DOB: Feb 05, 1976, 48 y.o.   MRN: [id number]

## 2023-06-25 NOTE — Progress Notes (Signed)
PROGRESS NOTE    Brooke Rush  WJX:[id number] DOB: [birth year] DOA: 06/23/2023 PCP: Katrinka Blazing, MD  Subjective: Pt seen and examined. One of pt's wound fluid cultures growing pseudomonas. IV ABX changed to IV cefepime. IV vanco stopped. Flagyl continued until final cultures  Still having pain. Wants neurontin dose increased. Doing ok with oxycodone. Needs laxative for opioid-induced constipation.  Afebrile. WBC 7.7  Drain output 235 ml yesterday       Hospital Course: HPI: Brooke Rush is a 48 y.o. female with medical history significant of panniculectomy at [facility] by Dr. Meredith Mody on 06/05/2023.  Patient seems to have done well till yesterday when she reports a new onset of purulence in the middle of her wound, see photo below.  Associated with fever as high as 102 F.  Patient unfortunately did not report to the ER yesterday and came today.  Patient denies any other associated symptoms such as vomiting diarrhea chest pain shortness of breath coughing any other rash on skin.   Patient reports since yesterday having an occipital area headache and visual blurring/blackening of visual fields in the medial aspect of both eyes. Onset since yesterday am. Denies trauma. No vomting.  Significant Events: Admitted 06/23/2023 with post-wound infection   Significant Labs: WBC 12.4, Hgb 13.1, plt 329, Na 136, K 3.5, BUN 6, scr 0.81  Significant Imaging Studies: CT abd  Extensive stranding and edema within the subcutaneous soft tissues of the anterior abdominal wall with fluid collection superficial to the rectus muscles measuring 15 mm maximum AP thickness over the right rectus and 15 mm maximum thickness over the left rectus and extends over a horizontal diameter of 20 cm. No strong rim enhancement. No internal gas. Findings could be secondary to postoperative change and postoperative fluid collection, but infection could also produce this appearance. 2. Hepatic  steatosis.  Antibiotic Therapy: Anti-infectives (From admission, onward)    Start     Dose/Rate Route Frequency Ordered Stop   06/24/23 0800  vancomycin (VANCOREADY) IVPB 750 mg/150 mL  Status:  Discontinued        750 mg 150 mL/hr over 60 Minutes Intravenous Every 12 hours 06/23/23 2303 06/23/23 2315   06/24/23 0800  vancomycin (VANCOCIN) 750 mg in sodium chloride 0.9 % 250 mL IVPB        750 mg 250 mL/hr over 60 Minutes Intravenous Every 12 hours 06/23/23 2315     06/24/23 0100  piperacillin-tazobactam (ZOSYN) IVPB 3.375 g        3.375 g 12.5 mL/hr over 240 Minutes Intravenous Every 8 hours 06/23/23 2303     06/23/23 1700  vancomycin (VANCOCIN) IVPB 1000 mg/200 mL premix  Status:  Discontinued        1,000 mg 200 mL/hr over 60 Minutes Intravenous  Once 06/23/23 1649 06/23/23 1658   06/23/23 1700  piperacillin-tazobactam (ZOSYN) IVPB 3.375 g        3.375 g 100 mL/hr over 30 Minutes Intravenous  Once 06/23/23 1649 06/23/23 1818   06/23/23 1700  Vancomycin (VANCOCIN) 1,500 mg in sodium chloride 0.9 % 500 mL IVPB        1,500 mg 250 mL/hr over 120 Minutes Intravenous  Once 06/23/23 1658 06/23/23 2223       Procedures:   Consultants:     Assessment and Plan: * Wound infection 06-24-2023 see postoperative seroma. Continue with IV zosyn/vanco for now.  06-25-2023 prelim cultures growing pseudomonas. Changed IV abx to cefepime. Stopped vanco. Continued flagyl.  Postoperative wound infection 06-24-2023 s/p IR placed drain. Has already had about 60 ml in JP bulb. Gram stain shows gram variable rods. Continue with zosyn and vanco for now. Pt asking if PICC line with long term IV abx were an option. I told her that final culture data needed to be obtained for making that kind of decision. Increase percocet to 5-10 mg q4h prn.  06-25-2023 increase neurontin to 100-300 mg q6h prn.  prelim cultures growing pseudomonas. Changed IV abx to cefepime. Stopped vanco. Continued  flagyl.  Sepsis (HCC) On admission. Present on admission. Evident as leukocytosis as well as tachycardia.  Source of infection is felt to be wound infection of patient's prior surgical site.  With associated inflammation in the subcutaneous area.  There are abdominal wall fluid collection superficial to the rectus muscle seen on CAT scan measuring 15 mm over the right rectus muscle.  And 15 mm over the left rectus muscle.  This was reviewed by the surgeon Dr. Meredith Mody.  And at this time antibiotic therapy is suggested.  I do feel that the collection is in communication with superficial skin due to the wound dehiscence site as it is visible on the photo here.  At this time continue with vancomycin and Zosyn.  Wound cultures ordered.  Patient's blood pressure is soft.  Additional 1 L bolus ordered.  Patient declined transfer to [facility] at this time.  I will put in an IR consult to see if they are able to drain any collection. 06-24-2023 continue to monitor.  06-25-2023 present on admission, due to post-op wound infection. WBC normalized now.  Visual field defects On admission, Not noted on conforntation exam. Check mri brain  06-24-2023 MRI brain negative for CVA.  Obesity, Class II, BMI 35-39.9 06-24-2023 BMI 36.9       DVT prophylaxis: enoxaparin (LOVENOX) injection 40 mg Start: 06/24/23 0800 SCDs Start: 06/23/23 2022    Code Status: Full Code Family Communication: no family at bedside Disposition Plan: return home Reason for continuing need for hospitalization: remains on IV Abx.  Objective: Vitals:   06/24/23 2035 06/25/23 0000 06/25/23 0517 06/25/23 0758  BP: (!) 91/39 103/60 (!) 96/54 107/65  Pulse: 95 83 97   Resp: 19   14  Temp: 98.1 F (36.7 C) 98.1 F (36.7 C) 98.4 F (36.9 C) 98.4 F (36.9 C)  TempSrc: Axillary Oral Oral Oral  SpO2: 100% 98% 98% 94%  Weight: 100.8 kg     Height:        Intake/Output Summary (Last 24 hours) at 06/25/2023 1201 Last data  filed at 06/25/2023 4098 Gross per 24 hour  Intake 368.64 ml  Output 245 ml  Net 123.64 ml   Filed Weights   06/23/23 1403 06/24/23 2035  Weight: 97.5 kg 100.8 kg    Examination:  Physical Exam Vitals and nursing note reviewed.  Constitutional:      Appearance: She is obese.  HENT:     Head: Normocephalic and atraumatic.  Eyes:     General: No scleral icterus. Abdominal:     Comments: JP drain with about 90 mls currently  Neurological:     Mental Status: She is alert and oriented to person, place, and time.     Data Reviewed: I have personally reviewed following labs and imaging studies  CBC: Recent Labs  Lab 06/23/23 1412 06/23/23 1421 06/23/23 1604 06/24/23 0413 06/25/23 0743  WBC 12.4*  --   --  10.0 7.7  NEUTROABS 9.8*  --   --   --  5.4  HGB 13.1 7.8* 12.2  12.2 10.7* 11.0*  HCT 38.7 23.0* 36.0  36.0 33.0* 33.8*  MCV 94.6  --   --  97.9 95.5  PLT 329  --   --  288 321   Basic Metabolic Panel: Recent Labs  Lab 06/23/23 1412 06/23/23 1421 06/23/23 1604 06/24/23 0413 06/25/23 0743  NA 136 138 136  136 133* 139  K 3.5 7.7* 6.1*  6.0* 3.3* 4.4  CL 103  --  105 105 106  CO2 24  --   --  21* 27  GLUCOSE 160*  --  108* 176* 119*  BUN 6  --  7 6 7   CREATININE 0.81  --  0.60 0.73 0.75  CALCIUM 8.7*  --   --  7.8* 8.3*   GFR: Estimated Creatinine Clearance: 101.4 mL/min (by C-G formula based on SCr of 0.75 mg/dL). Liver Function Tests: Recent Labs  Lab 06/23/23 1412 06/25/23 0743  AST 14* 12*  ALT 10 12  ALKPHOS 60 52  BILITOT 0.7 0.4  PROT 6.6 6.0*  ALBUMIN 3.2* 2.6*   Recent Labs  Lab 06/23/23 1412  LIPASE 20   Coagulation Profile: Recent Labs  Lab 06/24/23 0413  INR 1.3*   Sepsis Labs: Recent Labs  Lab 06/23/23 1420  LATICACIDVEN 1.7    Recent Results (from the past 240 hours)  Resp panel by RT-PCR (RSV, Flu A&B, Covid) Anterior Nasal Swab     Status: None   Collection Time: 06/23/23  5:38 PM   Specimen: Anterior Nasal  Swab  Result Value Ref Range Status   SARS Coronavirus 2 by RT PCR NEGATIVE NEGATIVE Final   Influenza A by PCR NEGATIVE NEGATIVE Final   Influenza B by PCR NEGATIVE NEGATIVE Final    Comment: (NOTE) The Xpert Xpress SARS-CoV-2/FLU/RSV plus assay is intended as an aid in the diagnosis of influenza from Nasopharyngeal swab specimens and should not be used as a sole basis for treatment. Nasal washings and aspirates are unacceptable for Xpert Xpress SARS-CoV-2/FLU/RSV testing.  Fact Sheet for Patients: BloggerCourse.com  Fact Sheet for Healthcare Providers: SeriousBroker.it  This test is not yet approved or cleared by the [place] FDA and has been authorized for detection and/or diagnosis of SARS-CoV-2 by FDA under an Emergency Use Authorization (EUA). This EUA will remain in effect (meaning this test can be used) for the duration of the COVID-19 declaration under Section 564(b)(1) of the Act, 21 U.S.C. section 360bbb-3(b)(1), unless the authorization is terminated or revoked.     Resp Syncytial Virus by PCR NEGATIVE NEGATIVE Final    Comment: (NOTE) Fact Sheet for Patients: BloggerCourse.com  Fact Sheet for Healthcare Providers: SeriousBroker.it  This test is not yet approved or cleared by the [place] FDA and has been authorized for detection and/or diagnosis of SARS-CoV-2 by FDA under an Emergency Use Authorization (EUA). This EUA will remain in effect (meaning this test can be used) for the duration of the COVID-19 declaration under Section 564(b)(1) of the Act, 21 U.S.C. section 360bbb-3(b)(1), unless the authorization is terminated or revoked.  Performed at [facility], [address], [place], [place] [address]   Aerobic/Anaerobic Culture w Gram Stain (surgical/deep wound)     Status: None (Preliminary result)   Collection Time: 06/23/23 11:25 PM    Specimen: Wound  Result Value Ref Range Status   Specimen Description WOUND  Final   Special Requests ABDOMEN  Final   Gram Stain   Final  FEW WBC PRESENT,BOTH PMN AND MONONUCLEAR FEW GRAM VARIABLE ROD    Culture   Final    FEW PSEUDOMONAS AERUGINOSA CULTURE REINCUBATED FOR BETTER GROWTH Performed at [facility], [address] [place], [place] [address]    Report Status PENDING  Incomplete  Aerobic/Anaerobic Culture w Gram Stain (surgical/deep wound)     Status: None (Preliminary result)   Collection Time: 06/24/23  9:08 AM   Specimen: Path fluid; Body Fluid  Result Value Ref Range Status   Specimen Description FLUID  Final   Special Requests abdomen  Final   Gram Stain   Final    FEW WBC PRESENT, PREDOMINANTLY PMN NO ORGANISMS SEEN Performed at [facility], [address] [place], [place] [address]    Culture PENDING  Incomplete   Report Status PENDING  Incomplete     Radiology Studies: IR IMAGE GUIDED DRAINAGE BY PERCUTANEOUS CATHETER Result Date: 06/24/2023 CLINICAL DATA:  Panniculectomy with lower abdominal fluid collection EXAM: ULTRASOUND and FLUOROSCOPIC-GUIDED DRAINAGE CATHETER PLACEMENT into LOWER ABDOMINAL WALL FLUID COLLECTION COMPARISON:  CT AP, 06/23/2023 MEDICATIONS: The patient was on scheduled antibiotics. ANESTHESIA/SEDATION: Local anesthetic and single agent sedation was employed during this procedure. A total of fentanyl 100 mcg was administered intravenously. The patient's level of consciousness and vital signs were monitored continuously by radiology nursing throughout the procedure under my direct supervision. FLUOROSCOPY: Fluoroscopic dose; 1 mGy COMPLICATIONS: None immediate. PROCEDURE: Informed written consent was obtained from the patient and/or patient's representative after a discussion of the risks, benefits and alternatives to treatment. Pre-procedural ultrasound scanning demonstrated subcutaneous fluid collection at the lower abdomen,  underlying the incision. A timeout was performed prior to the initiation of the procedure. The lower abdomen was prepped and draped in the usual sterile fashion. The overlying soft tissues were anesthetized with 1% lidocaine with epinephrine. Under direct ultrasound guidance, a 18 gauge trocar needle was advanced, then a short Amplatz super stiff wire was coiled within the collection. Ultrasound images were saved for procedural documentation purposes. Fluoroscopic evaluation of the wire demonstrated but the wire crossed the midline. The tract was serially dilated allowing placement of a 12 Fr drainage catheter. Final catheter positioning was confirmed with a fluoroscopic radiographic image. 10 mL of seromatous fluid was aspirated. A representative sample of aspirated fluid was capped and sent to the laboratory for analysis. The tube was connected to a bulb suction and sutured in place. A dressing was placed. The patient tolerated the procedure well without immediate post procedural complication. FINDINGS: Lower abdominal fluid collection in communication with the guidewire crossing the midline. A unilateral drainage catheter was therefore recommended. IMPRESSION: Successful [place] and fluoroscopic-guided drainage catheter placement into lower quadrant abdominal wall fluid collection. 10 mL of seromatous fluid was aspirated and a representative aspirated sample was sent to the laboratory as requested by the ordering clinical team. RECOMMENDATIONS: The patient will return to Vascular Interventional Radiology (VIR) for routine drainage catheter evaluation in 7-10 days. Roanna Banning, MD [facility] Radiology Electronically Signed   By: Roanna Banning M.D.   On: 06/24/2023 14:47   IR US Guidance Result Date: 06/24/2023 CLINICAL DATA:  Panniculectomy with lower abdominal fluid collection EXAM: ULTRASOUND and FLUOROSCOPIC-GUIDED DRAINAGE CATHETER PLACEMENT into LOWER ABDOMINAL  WALL FLUID COLLECTION COMPARISON:  CT AP, 06/23/2023 MEDICATIONS: The patient was on scheduled antibiotics. ANESTHESIA/SEDATION: Local anesthetic and single agent sedation was employed during this procedure. A total of fentanyl 100 mcg was administered intravenously. The patient's level  of consciousness and vital signs were monitored continuously by radiology nursing throughout the procedure under my direct supervision. FLUOROSCOPY: Fluoroscopic dose; 1 mGy COMPLICATIONS: None immediate. PROCEDURE: Informed written consent was obtained from the patient and/or patient's representative after a discussion of the risks, benefits and alternatives to treatment. Pre-procedural ultrasound scanning demonstrated subcutaneous fluid collection at the lower abdomen, underlying the incision. A timeout was performed prior to the initiation of the procedure. The lower abdomen was prepped and draped in the usual sterile fashion. The overlying soft tissues were anesthetized with 1% lidocaine with epinephrine. Under direct ultrasound guidance, a 18 gauge trocar needle was advanced, then a short Amplatz super stiff wire was coiled within the collection. Ultrasound images were saved for procedural documentation purposes. Fluoroscopic evaluation of the wire demonstrated but the wire crossed the midline. The tract was serially dilated allowing placement of a 12 Fr drainage catheter. Final catheter positioning was confirmed with a fluoroscopic radiographic image. 10 mL of seromatous fluid was aspirated. A representative sample of aspirated fluid was capped and sent to the laboratory for analysis. The tube was connected to a bulb suction and sutured in place. A dressing was placed. The patient tolerated the procedure well without immediate post procedural complication. FINDINGS: Lower abdominal fluid collection in communication with the guidewire crossing the midline. A unilateral drainage catheter was therefore recommended. IMPRESSION:  Successful [place] and fluoroscopic-guided drainage catheter placement into lower quadrant abdominal wall fluid collection. 10 mL of seromatous fluid was aspirated and a representative aspirated sample was sent to the laboratory as requested by the ordering clinical team. RECOMMENDATIONS: The patient will return to Vascular Interventional Radiology (VIR) for routine drainage catheter evaluation in 7-10 days. Roanna Banning, MD [facility] Radiology Electronically Signed   By: Roanna Banning M.D.   On: 06/24/2023 14:47   MR BRAIN WO CONTRAST Result Date: 06/24/2023 CLINICAL DATA:  Stroke suspected EXAM: MRI HEAD WITHOUT CONTRAST TECHNIQUE: Multiplanar, multiecho pulse sequences of the brain and surrounding structures were obtained without intravenous contrast. COMPARISON:  None Available. FINDINGS: Brain: No acute infarct, mass effect or extra-axial collection. No acute or chronic hemorrhage. Normal white matter signal, parenchymal volume and CSF spaces. The midline structures are normal. Vascular: Normal flow voids. Skull and upper cervical spine: Normal calvarium and skull base. Visualized upper cervical spine and soft tissues are normal. Sinuses/Orbits:No paranasal sinus fluid levels or advanced mucosal thickening. No mastoid or middle ear effusion. Normal orbits. IMPRESSION: Normal brain MRI. Electronically Signed   By: Deatra Robinson M.D.   On: 06/24/2023 02:51   CT ABDOMEN PELVIS W CONTRAST Result Date: 06/23/2023 CLINICAL DATA:  Abdomen pain EXAM: CT ABDOMEN AND PELVIS WITH CONTRAST TECHNIQUE: Multidetector CT imaging of the abdomen and pelvis was performed using the standard protocol following bolus administration of intravenous contrast. RADIATION DOSE REDUCTION: This exam was performed according to the departmental dose-optimization program which includes automated exposure control, adjustment of the mA and/or kV according to patient size and/or use of iterative  reconstruction technique. CONTRAST:  75mL OMNIPAQUE IOHEXOL 350 MG/ML SOLN COMPARISON:  None Available. FINDINGS: Lower chest: Lung bases demonstrate no acute airspace disease. Hepatobiliary: Hepatic steatosis. Cholecystectomy. No biliary dilatation Pancreas: Unremarkable. No pancreatic ductal dilatation or surrounding inflammatory changes. Spleen: Normal in size without focal abnormality. Adrenals/Urinary Tract: Adrenal glands are unremarkable. Kidneys are normal, without renal calculi, focal lesion, or hydronephrosis. Bladder is unremarkable. Stomach/Bowel: Stomach is within normal limits. Appendix appears normal. No evidence of bowel wall thickening, distention, or  inflammatory changes. Vascular/Lymphatic: No significant vascular findings are present. No enlarged abdominal or pelvic lymph nodes. Reproductive: Uterus and bilateral adnexa are unremarkable. Other: Negative for pelvic effusion or free air. Extensive stranding and edema within the subcutaneous soft tissues of the anterior abdominal wall with fluid collection superficial to the rectus, this measures 15 mm maximum AP thickness over the right rectus and 15 mm maximum thickness over the left rectus and extends over a horizontal diameter of 20 cm. No strong rim enhancement. No internal gas. Few clips in the region. Soft tissue wound or defect along the midline inferior 21 abdominal wall Nonspecific mild stranding within the mesentery root with small nodes. Musculoskeletal: No acute or suspicious osseous abnormality IMPRESSION: 1. Extensive stranding and edema within the subcutaneous soft tissues of the anterior abdominal wall with fluid collection superficial to the rectus muscles measuring 15 mm maximum AP thickness over the right rectus and 15 mm maximum thickness over the left rectus and extends over a horizontal diameter of 20 cm. No strong rim enhancement. No internal gas. Findings could be secondary to postoperative change and postoperative fluid  collection, but infection could also produce this appearance. 2. Hepatic steatosis. Electronically Signed   By: Jasmine Pang M.D.   On: 06/23/2023 19:02   DG Abd Portable 1V Result Date: 06/23/2023 CLINICAL DATA:  Pain EXAM: PORTABLE ABDOMEN - 1 VIEW COMPARISON:  06/15/2007 FINDINGS: The bowel gas pattern is normal. Moderate volume stool throughout the colon. Cholecystectomy clips. No radio-opaque calculi or other significant radiographic abnormality are seen. IMPRESSION: Nonobstructive bowel gas pattern. Moderate volume stool throughout the colon. Electronically Signed   By: Duanne Guess D.O.   On: 06/23/2023 15:32    Scheduled Meds:  bisacodyl  10 mg Oral Once   docusate sodium  200 mg Oral Daily   enoxaparin (LOVENOX) injection  40 mg Subcutaneous Q24H   fluticasone  1 spray Each Nare Daily   loratadine  10 mg Oral Daily   QUEtiapine  50 mg Oral QHS   senna-docusate  2 tablet Oral QHS   sodium chloride flush  3 mL Intravenous Q12H   sodium chloride flush  5 mL Intracatheter Q8H   Continuous Infusions:  ceFEPime (MAXIPIME) IV 2 g (06/25/23 1038)   metronidazole 500 mg (06/25/23 1155)     LOS: 2 days   Time spent: 40 minutes  Carollee Herter, DO  [facility]  06/25/2023, 12:01 PM

## 2023-06-25 NOTE — Progress Notes (Signed)
Pharmacy Antibiotic Note  Brooke Rush is a 48 y.o. female admitted on 06/23/2023 presenting s/p panniculectomy with purulence and concern for wound infection.  Pharmacy has been consulted for vancomycin and zosyn dosing.  Vancomycin 1500 mg IV x 1 given in ED.   Wound culture now growing pseudomonas. D/W Dr. Imogene Burn, will d/c vancomycin and zosyn and change to cefepime and flagyl (keep anaerobic coverage until final cultures return)  Plan: D/C Vancomycin  D/C Zosyn  Cefepime 2gm IV q8h Flagyl 500mg  IV q12h Monitor renal function, Cx and clinical progression to narrow   Height: 5\' 4"  (162.6 cm) Weight: 100.8 kg (222 lb 3.6 oz) IBW/kg (Calculated) : 54.7  Temp (24hrs), Avg:98.3 F (36.8 C), Min:98.1 F (36.7 C), Max:98.7 F (37.1 C)  Recent Labs  Lab 06/23/23 1412 06/23/23 1420 06/23/23 1604 06/24/23 0413 06/25/23 0743  WBC 12.4*  --   --  10.0 7.7  CREATININE 0.81  --  0.60 0.73  --   LATICACIDVEN  --  1.7  --   --   --     Estimated Creatinine Clearance: 101.4 mL/min (by C-G formula based on SCr of 0.73 mg/dL).    Allergies  Allergen Reactions   Latex Hives   Other Anaphylaxis    Paper mask   Nsaids Nausea And Vomiting    Acid reflux    Brooke Rush A. Jeanella Craze, PharmD, BCPS, FNKF Clinical Pharmacist [place] Please utilize Amion for appropriate phone number to reach the unit pharmacist [facility])  06/25/2023 8:42 AM

## 2023-06-25 NOTE — Plan of Care (Signed)

## 2023-06-25 NOTE — Consult Note (Signed)
WOC Nurse Consult Note: Reason for Consult: surgical wound Patient underwent panniculectomy 12/30 per plastic surgery at [facility], patient decline transfer to this facility for post op complications Wound type: surgical wound with drainage collection.  IR placed drain yesterday Linear incision that is mostly healed along the midline of the abdomen, horizontal incision under pannus with dehiscence area centrally   Pressure Injury POA: NA Measurement: see nursing flow sheet Wound ZOX:WRUEAV  Drainage (amount, consistency, odor) scant Periwound: intact  Dressing procedure/placement/frequency: Cleanse distal portion of wounds (open area) with saline, pat dry.  Cover with strip of silver hydrofiber, Aquacel Ag+ (lawson # [id number]) top with foam. Change daily  Follow up with plastic surgeon as an outpatient  Brooke Rush [facility], CNS, CWON-AP [phone]

## 2023-06-25 NOTE — Plan of Care (Signed)
  Problem: Education: Goal: Knowledge of General Education information will improve Description: Including pain rating scale, medication(s)/side effects and non-pharmacologic comfort measures Outcome: Progressing   Problem: Pain Managment: Goal: General experience of comfort will improve and/or be controlled Outcome: Progressing   Problem: Safety: Goal: Ability to remain free from injury will improve Outcome: Progressing

## 2023-06-26 DIAGNOSIS — B965 Pseudomonas (aeruginosa) (mallei) (pseudomallei) as the cause of diseases classified elsewhere: Secondary | ICD-10-CM | POA: Diagnosis not present

## 2023-06-26 DIAGNOSIS — Z8619 Personal history of other infectious and parasitic diseases: Secondary | ICD-10-CM | POA: Diagnosis not present

## 2023-06-26 DIAGNOSIS — B9620 Unspecified Escherichia coli [E. coli] as the cause of diseases classified elsewhere: Secondary | ICD-10-CM

## 2023-06-26 DIAGNOSIS — T148XXA Other injury of unspecified body region, initial encounter: Secondary | ICD-10-CM | POA: Diagnosis not present

## 2023-06-26 DIAGNOSIS — T8140XD Infection following a procedure, unspecified, subsequent encounter: Secondary | ICD-10-CM

## 2023-06-26 DIAGNOSIS — E66812 Obesity, class 2: Secondary | ICD-10-CM | POA: Diagnosis not present

## 2023-06-26 DIAGNOSIS — T8149XA Infection following a procedure, other surgical site, initial encounter: Secondary | ICD-10-CM | POA: Diagnosis not present

## 2023-06-26 MED ORDER — DIPHENHYDRAMINE-ZINC ACETATE 2-0.1 % EX CREA
TOPICAL_CREAM | Freq: Three times a day (TID) | CUTANEOUS | Status: DC
  Administered 2023-06-26: 1 via TOPICAL
  Filled 2023-06-26: qty 28

## 2023-06-26 MED ORDER — HYDROXYZINE HCL 10 MG PO TABS
10.0000 mg | ORAL_TABLET | Freq: Once | ORAL | Status: AC | PRN
  Administered 2023-06-26: 10 mg via ORAL
  Filled 2023-06-26 (×2): qty 1

## 2023-06-26 MED ORDER — VALACYCLOVIR HCL 500 MG PO TABS
500.0000 mg | ORAL_TABLET | Freq: Two times a day (BID) | ORAL | Status: DC
  Administered 2023-06-26 – 2023-06-27 (×3): 500 mg via ORAL
  Filled 2023-06-26 (×3): qty 1

## 2023-06-26 MED ORDER — HYDROXYZINE HCL 10 MG PO TABS: 10.0000 mg | ORAL_TABLET | Freq: Three times a day (TID) | ORAL | Status: DC | PRN

## 2023-06-26 MED ORDER — HYDROXYZINE HCL 25 MG PO TABS
25.0000 mg | ORAL_TABLET | Freq: Three times a day (TID) | ORAL | Status: DC | PRN
  Administered 2023-06-26: 25 mg via ORAL
  Filled 2023-06-26 (×2): qty 1

## 2023-06-26 NOTE — Plan of Care (Signed)
  Problem: Safety: Goal: Ability to remain free from injury will improve Outcome: Progressing   Problem: Skin Integrity: Goal: Risk for impaired skin integrity will decrease Outcome: Progressing   

## 2023-06-26 NOTE — Assessment & Plan Note (Signed)
06-26-2023 pt feels like she is about to have outbreak of genital herpes. Will start valtrex 500 mg bid.  06-18-2023 continue valtrex at home.

## 2023-06-26 NOTE — Progress Notes (Incomplete)
   06/26/23 1335  TOC Brief Assessment  Insurance and Status Reviewed (BCBS COMM PPO)  Patient has primary care physician Yes Boneta Lucks, Janae Sauce, MD)  Home environment has been reviewed From home with Spouse  Prior level of function: independent  Prior/Current Home Services No current home services  Social Drivers of Health Review SDOH reviewed no interventions necessary  Readmission risk has been reviewed Yes (17%)  Transition of care needs no transition of care needs at this time   Patient from home with Spouse

## 2023-06-26 NOTE — Consult Note (Signed)
[facility]       Reason for Consult: Wound infection    Referring Physician: Dr. Imogene Burn  Principal Problem:   Wound infection Active Problems:   Visual field defects   Sepsis (HCC)   Postoperative wound infection   Obesity, Class II, BMI 35-39.9   History of herpes genitalis    diphenhydrAMINE-zinc acetate   Topical TID   docusate sodium  200 mg Oral Daily   enoxaparin (LOVENOX) injection  40 mg Subcutaneous Q24H   fluticasone  1 spray Each Nare Daily   loratadine  10 mg Oral Daily   QUEtiapine  50 mg Oral QHS   senna-docusate  2 tablet Oral QHS   sodium chloride flush  3 mL Intravenous Q12H   sodium chloride flush  5 mL Intracatheter Q8H   valACYclovir  500 mg Oral BID    Recommendations: Cipro 500 mg twice a day plus cefadroxil 1000 mg twice a day for 10 days Follow-up with her surgeon regarding improvement of the infection  Assessment: He has an infected seroma with E. coli and Pseudomonas with sensitivities noted.  Overall improving on antibiotics and with drainage.  Antibiotics as above.  Can follow-up with her surgeon in that time to see about improvement. Otherwise I will follow-up as needed.  HPI: Brooke Rush is a 48 y.o. female with a history of obesity and panniculectomy done at [facility] by Dr. Meredith Mody in December who comes in here with purulence of the middle of the wound.  She had been doing well earlier in the week then developed sudden purulence and then fever up to 102.  Had a JP drain placed by interventional radiology and fluid consistent with seroma.  Cultures now positive for E. coli and Pseudomonas.  She feels better overall.  Has not been febrile here and with mild leukocytosis on presentation but has been normal since.   Review of Systems:  Constitutional: negative for fevers and chills All other systems reviewed and are negative    Past Medical History:  Diagnosis Date   Arthritis    in her neck    Constipation    2/2 pain medication   Fibromyalgia    Diagnosed by Dr. Darrick Huntsman   GERD (gastroesophageal reflux disease)    Kidney stones    Migraines     Social History   Tobacco Use   Smoking status: Former    Current packs/day: 0.00    Average packs/day: 1 pack/day for 21.0 years (21.0 ttl pk-yrs)    Types: Cigarettes    Start date: 06/05/1995    Quit date: 06/04/2016    Years since quitting: 7.0   Smokeless tobacco: Never  Substance Use Topics   Alcohol use: No    Alcohol/week: 0.0 standard drinks of alcohol    Comment: 1-2 drinks twice a week/not during pregnancy   Drug use: No    Family History  Problem Relation Age of Onset   Arthritis Mother        Degenerative Disc Dz   Obesity Mother    Alcohol abuse Father    Hyperlipidemia Father    Heart disease Father        CAD - CABG age 39   Kidney disease Maternal Grandmother    Heart disease Maternal Grandmother    Heart disease Maternal Grandfather    Hyperlipidemia Maternal Grandfather    Hypertension Maternal Grandfather     Allergies  Allergen Reactions   Latex Hives  Other Anaphylaxis    Paper mask   Nsaids Nausea And Vomiting    Acid reflux    Physical Exam: Constitutional: in no apparent distress  Vitals:   06/26/23 0847 06/26/23 1636  BP: 116/64 136/65  Pulse: (!) 108 (!) 102  Resp:  18  Temp: 98.4 F (36.9 C) 98.6 F (37 C)  SpO2: 95% 99%   EYES: anicteric Respiratory: Normal respiratory effort GI: soft, + bandage over wound, no surrounding erythema  Lab Results  Component Value Date   WBC 7.7 06/25/2023   HGB 11.0 (L) 06/25/2023   HCT 33.8 (L) 06/25/2023   MCV 95.5 06/25/2023   PLT 321 06/25/2023    Lab Results  Component Value Date   CREATININE 0.75 06/25/2023   BUN 7 06/25/2023   NA 139 06/25/2023   K 4.4 06/25/2023   CL 106 06/25/2023   CO2 27 06/25/2023    Lab Results  Component Value Date   ALT 12 06/25/2023   AST 12 (L) 06/25/2023   ALKPHOS 52 06/25/2023      Microbiology: Recent Results (from the past 240 hours)  Resp panel by RT-PCR (RSV, Flu A&B, Covid) Anterior Nasal Swab     Status: None   Collection Time: 06/23/23  5:38 PM   Specimen: Anterior Nasal Swab  Result Value Ref Range Status   SARS Coronavirus 2 by RT PCR NEGATIVE NEGATIVE Final   Influenza A by PCR NEGATIVE NEGATIVE Final   Influenza B by PCR NEGATIVE NEGATIVE Final    Comment: (NOTE) The Xpert Xpress SARS-CoV-2/FLU/RSV plus assay is intended as an aid in the diagnosis of influenza from Nasopharyngeal swab specimens and should not be used as a sole basis for treatment. Nasal washings and aspirates are unacceptable for Xpert Xpress SARS-CoV-2/FLU/RSV testing.  Fact Sheet for Patients: BloggerCourse.com  Fact Sheet for Healthcare Providers: SeriousBroker.it  This test is not yet approved or cleared by the [place] FDA and has been authorized for detection and/or diagnosis of SARS-CoV-2 by FDA under an Emergency Use Authorization (EUA). This EUA will remain in effect (meaning this test can be used) for the duration of the COVID-19 declaration under Section 564(b)(1) of the Act, 21 U.S.C. section 360bbb-3(b)(1), unless the authorization is terminated or revoked.     Resp Syncytial Virus by PCR NEGATIVE NEGATIVE Final    Comment: (NOTE) Fact Sheet for Patients: BloggerCourse.com  Fact Sheet for Healthcare Providers: SeriousBroker.it  This test is not yet approved or cleared by the [place] FDA and has been authorized for detection and/or diagnosis of SARS-CoV-2 by FDA under an Emergency Use Authorization (EUA). This EUA will remain in effect (meaning this test can be used) for the duration of the COVID-19 declaration under Section 564(b)(1) of the Act, 21 U.S.C. section 360bbb-3(b)(1), unless the authorization is terminated or revoked.  Performed at  [facility], [address] [place], [place] [address]   Aerobic/Anaerobic Culture w Gram Stain (surgical/deep wound)     Status: None (Preliminary result)   Collection Time: 06/23/23 11:25 PM   Specimen: Wound  Result Value Ref Range Status   Specimen Description WOUND  Final   Special Requests ABDOMEN  Final   Gram Stain   Final    FEW WBC PRESENT,BOTH PMN AND MONONUCLEAR FEW GRAM VARIABLE ROD    Culture   Final    MODERATE PSEUDOMONAS AERUGINOSA MODERATE ESCHERICHIA COLI NO ANAEROBES ISOLATED CULTURE REINCUBATED FOR BETTER GROWTH Performed at [facility], [address] [place], [place] [address]    Report Status PENDING  Incomplete   Organism ID, Bacteria PSEUDOMONAS AERUGINOSA  Final   Organism ID, Bacteria ESCHERICHIA COLI  Final      Susceptibility   Escherichia coli - MIC*    AMPICILLIN >=32 RESISTANT Resistant     CEFEPIME <=0.12 SENSITIVE Sensitive     CEFTAZIDIME <=1 SENSITIVE Sensitive     CEFTRIAXONE <=0.25 SENSITIVE Sensitive     CIPROFLOXACIN >=4 RESISTANT Resistant     GENTAMICIN >=16 RESISTANT Resistant     IMIPENEM <=0.25 SENSITIVE Sensitive     TRIMETH/SULFA >=320 RESISTANT Resistant     AMPICILLIN/SULBACTAM 8 SENSITIVE Sensitive     PIP/TAZO <=4 SENSITIVE Sensitive ug/mL    * MODERATE ESCHERICHIA COLI   Pseudomonas aeruginosa - MIC*    CEFTAZIDIME 4 SENSITIVE Sensitive     CIPROFLOXACIN <=0.25 SENSITIVE Sensitive     GENTAMICIN <=1 SENSITIVE Sensitive     IMIPENEM 2 SENSITIVE Sensitive     PIP/TAZO 8 SENSITIVE Sensitive ug/mL    CEFEPIME 2 SENSITIVE Sensitive     * MODERATE PSEUDOMONAS AERUGINOSA  Aerobic/Anaerobic Culture w Gram Stain (surgical/deep wound)     Status: None (Preliminary result)   Collection Time: 06/24/23  9:08 AM   Specimen: Path fluid; Body Fluid  Result Value Ref Range Status   Specimen Description FLUID  Final   Special Requests abdomen  Final   Gram Stain   Final    FEW WBC PRESENT, PREDOMINANTLY PMN NO  ORGANISMS SEEN Performed at [facility], [address], [place], [place] [address]    Culture   Final    FEW ESCHERICHIA COLI CULTURE REINCUBATED FOR BETTER GROWTH NO ANAEROBES ISOLATED; CULTURE IN PROGRESS FOR 5 DAYS    Report Status PENDING  Incomplete   Organism ID, Bacteria ESCHERICHIA COLI  Final      Susceptibility   Escherichia coli - MIC*    AMPICILLIN >=32 RESISTANT Resistant     CEFEPIME <=0.12 SENSITIVE Sensitive     CEFTAZIDIME <=1 SENSITIVE Sensitive     CEFTRIAXONE <=0.25 SENSITIVE Sensitive     CIPROFLOXACIN >=4 RESISTANT Resistant     GENTAMICIN >=16 RESISTANT Resistant     IMIPENEM <=0.25 SENSITIVE Sensitive     TRIMETH/SULFA >=320 RESISTANT Resistant     AMPICILLIN/SULBACTAM 16 INTERMEDIATE Intermediate     PIP/TAZO <=4 SENSITIVE Sensitive ug/mL    * FEW ESCHERICHIA COLI    Gardiner Barefoot, MD [facility] [email or web address] 06/26/2023, 5:16 PM

## 2023-06-26 NOTE — Progress Notes (Signed)
   06/26/23 1335  TOC Brief Assessment  Insurance and Status Reviewed (BCBS COMM PPO)  Patient has primary care physician Yes Boneta Lucks, Janae Sauce, MD)  Home environment has been reviewed From home with Spouse  Prior level of function: independent  Prior/Current Home Services No current home services  Social Drivers of Health Review SDOH reviewed no interventions necessary  Readmission risk has been reviewed Yes (17%)  Transition of care needs no transition of care needs at this time   Patient from home with Spouse  TOC will continue to follow patient for any additional discharge needs    Patient underwent panniculectomy 12/30 per plastic surgery at [facility], patient decline transfer to this facility for post op complications Wound type: surgical wound with drainage collection.  IR placed drain yesterday Linear incision that is mostly healed along the midline of the abdomen, horizontal incision under pannus with dehiscence area centrally

## 2023-06-26 NOTE — Progress Notes (Signed)
PROGRESS NOTE    Brooke Rush  QIO:[id number] DOB: 1975/07/01 DOA: 06/23/2023 PCP: Katrinka Blazing, MD  Subjective: Pt seen and examined.  Wound cx growing E. Coli that is resistant to cipro. Pan-sensitive pseudomonas.  Has small rash on back C/o of sensation of genital burning like she is about to have outbreak of her genital herpes. Requesting to start valtrex.   Hospital Course: HPI: Brooke Rush is a 48 y.o. female with medical history significant of panniculectomy at [facility] by Dr. Meredith Mody on 06/05/2023.  Patient seems to have done well till yesterday when she reports a new onset of purulence in the middle of her wound, see photo below.  Associated with fever as high as 102 F.  Patient unfortunately did not report to the ER yesterday and came today.  Patient denies any other associated symptoms such as vomiting diarrhea chest pain shortness of breath coughing any other rash on skin.   Patient reports since yesterday having an occipital area headache and visual blurring/blackening of visual fields in the medial aspect of both eyes. Onset since yesterday am. Denies trauma. No vomting.  Significant Events: Admitted 06/23/2023 with post-wound infection   Significant Labs: WBC 12.4, Hgb 13.1, plt 329, Na 136, K 3.5, BUN 6, scr 0.81  Significant Imaging Studies: CT abd  Extensive stranding and edema within the subcutaneous soft tissues of the anterior abdominal wall with fluid collection superficial to the rectus muscles measuring 15 mm maximum AP thickness over the right rectus and 15 mm maximum thickness over the left rectus and extends over a horizontal diameter of 20 cm. No strong rim enhancement. No internal gas. Findings could be secondary to postoperative change and postoperative fluid collection, but infection could also produce this appearance. 2. Hepatic steatosis.  Antibiotic Therapy: Anti-infectives (From admission, onward)    Start     Dose/Rate Route  Frequency Ordered Stop   06/24/23 0800  vancomycin (VANCOREADY) IVPB 750 mg/150 mL  Status:  Discontinued        750 mg 150 mL/hr over 60 Minutes Intravenous Every 12 hours 06/23/23 2303 06/23/23 2315   06/24/23 0800  vancomycin (VANCOCIN) 750 mg in sodium chloride 0.9 % 250 mL IVPB        750 mg 250 mL/hr over 60 Minutes Intravenous Every 12 hours 06/23/23 2315     06/24/23 0100  piperacillin-tazobactam (ZOSYN) IVPB 3.375 g        3.375 g 12.5 mL/hr over 240 Minutes Intravenous Every 8 hours 06/23/23 2303     06/23/23 1700  vancomycin (VANCOCIN) IVPB 1000 mg/200 mL premix  Status:  Discontinued        1,000 mg 200 mL/hr over 60 Minutes Intravenous  Once 06/23/23 1649 06/23/23 1658   06/23/23 1700  piperacillin-tazobactam (ZOSYN) IVPB 3.375 g        3.375 g 100 mL/hr over 30 Minutes Intravenous  Once 06/23/23 1649 06/23/23 1818   06/23/23 1700  Vancomycin (VANCOCIN) 1,500 mg in sodium chloride 0.9 % 500 mL IVPB        1,500 mg 250 mL/hr over 120 Minutes Intravenous  Once 06/23/23 1658 06/23/23 2223       Procedures:   Consultants:     Assessment and Plan: * Wound infection 06-24-2023 see postoperative seroma. Continue with IV zosyn/vanco for now. 06-25-2023 prelim cultures growing pseudomonas. Changed IV abx to cefepime. Stopped vanco. Continued flagyl.  06-26-2023 wound cx growing E. Coli and Pseudomonas. ID consulted.  Postoperative wound infection  06-24-2023 s/p IR placed drain. Has already had about 60 ml in JP bulb. Gram stain shows gram variable rods. Continue with zosyn and vanco for now. Pt asking if PICC line with long term IV abx were an option. I told her that final culture data needed to be obtained for making that kind of decision. Increase percocet to 5-10 mg q4h prn. 06-25-2023 increase neurontin to 100-300 mg q6h prn.  prelim cultures growing pseudomonas. Changed IV abx to cefepime. Stopped vanco. Continued flagyl.  06-26-2023 pain controlled with oxy and  neurontin. ID consulted for E. Coli and pseudomonas wound infection.  History of herpes genitalis 06-26-2023 pt feels like she is about to have outbreak of genital herpes. Will start valtrex 500 mg bid.  Sepsis (HCC) On admission. Present on admission. Evident as leukocytosis as well as tachycardia.  Source of infection is felt to be wound infection of patient's prior surgical site.  With associated inflammation in the subcutaneous area.  There are abdominal wall fluid collection superficial to the rectus muscle seen on CAT scan measuring 15 mm over the right rectus muscle.  And 15 mm over the left rectus muscle.  This was reviewed by the surgeon Dr. Meredith Mody.  And at this time antibiotic therapy is suggested.  I do feel that the collection is in communication with superficial skin due to the wound dehiscence site as it is visible on the photo here.  At this time continue with vancomycin and Zosyn.  Wound cultures ordered.  Patient's blood pressure is soft.  Additional 1 L bolus ordered.  Patient declined transfer to [facility] at this time.  I will put in an IR consult to see if they are able to drain any collection. 06-24-2023 continue to monitor.  06-25-2023 present on admission, due to post-op wound infection. WBC normalized now.  Obesity, Class II, BMI 35-39.9 06-24-2023 BMI 36.9  Visual field defects On admission, Not noted on conforntation exam. Check mri brain  06-24-2023 MRI brain negative for CVA.       DVT prophylaxis: enoxaparin (LOVENOX) injection 40 mg Start: 06/24/23 0800 SCDs Start: 06/23/23 2022     Code Status: Full Code Family Communication: discussed with pt. Pt is decisional. 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 ID consulted for IV Abx management.  Objective: Vitals:   06/25/23 1514 06/25/23 2048 06/26/23 0407 06/26/23 0847   BP: 125/72 (!) 104/57 104/63 116/64  Pulse: (!) 103 93 94 (!) 108  Resp: 18 18 18    Temp: 98.3 F (36.8 C) 98.1 F (36.7 C) 98.5 F (36.9 C) 98.4 F (36.9 C)  TempSrc: Oral Oral Oral Oral  SpO2: 100% 100% 98% 95%  Weight:      Height:        Intake/Output Summary (Last 24 hours) at 06/26/2023 1335 Last data filed at 06/26/2023 1046 Gross per 24 hour  Intake --  Output 60 ml  Net -60 ml   Filed Weights   06/23/23 1403 06/24/23 2035  Weight: 97.5 kg 100.8 kg    Examination:  Physical Exam Vitals and nursing note reviewed.  Constitutional:      General: She is not in acute distress.    Appearance: She is obese. She is not toxic-appearing or diaphoretic.  HENT:     Head: Normocephalic and atraumatic.     Nose: Nose normal.  Eyes:  General: No scleral icterus. Cardiovascular:     Rate and Rhythm: Normal rate and regular rhythm.  Pulmonary:     Effort: Pulmonary effort is normal.     Breath sounds: Normal breath sounds.  Abdominal:     General: Bowel sounds are normal. There is no distension.  Musculoskeletal:     Comments: Drainage catheter intacted  Skin:    Capillary Refill: Capillary refill takes less than 2 seconds.     Comments: Mild rash on upper back. No drainage  Neurological:     Mental Status: She is alert and oriented to person, place, and time.     Data Reviewed: I have personally reviewed following labs and imaging studies  CBC: Recent Labs  Lab 06/23/23 1412 06/23/23 1421 06/23/23 1604 06/24/23 0413 06/25/23 0743  WBC 12.4*  --   --  10.0 7.7  NEUTROABS 9.8*  --   --   --  5.4  HGB 13.1 7.8* 12.2  12.2 10.7* 11.0*  HCT 38.7 23.0* 36.0  36.0 33.0* 33.8*  MCV 94.6  --   --  97.9 95.5  PLT 329  --   --  288 321   Basic Metabolic Panel: Recent Labs  Lab 06/23/23 1412 06/23/23 1421 06/23/23 1604 06/24/23 0413 06/25/23 0743  NA 136 138 136  136 133* 139  K 3.5 7.7* 6.1*  6.0* 3.3* 4.4  CL 103  --  105 105 106  CO2 24  --    --  21* 27  GLUCOSE 160*  --  108* 176* 119*  BUN 6  --  7 6 7   CREATININE 0.81  --  0.60 0.73 0.75  CALCIUM 8.7*  --   --  7.8* 8.3*   GFR: Estimated Creatinine Clearance: 101.4 mL/min (by C-G formula based on SCr of 0.75 mg/dL). Liver Function Tests: Recent Labs  Lab 06/23/23 1412 06/25/23 0743  AST 14* 12*  ALT 10 12  ALKPHOS 60 52  BILITOT 0.7 0.4  PROT 6.6 6.0*  ALBUMIN 3.2* 2.6*   Recent Labs  Lab 06/23/23 1412  LIPASE 20    Coagulation Profile: Recent Labs  Lab 06/24/23 0413  INR 1.3*   Sepsis Labs: Recent Labs  Lab 06/23/23 1420  LATICACIDVEN 1.7    Recent Results (from the past 240 hours)  Resp panel by RT-PCR (RSV, Flu A&B, Covid) Anterior Nasal Swab     Status: None   Collection Time: 06/23/23  5:38 PM   Specimen: Anterior Nasal Swab  Result Value Ref Range Status   SARS Coronavirus 2 by RT PCR NEGATIVE NEGATIVE Final   Influenza A by PCR NEGATIVE NEGATIVE Final   Influenza B by PCR NEGATIVE NEGATIVE Final    Comment: (NOTE) The Xpert Xpress SARS-CoV-2/FLU/RSV plus assay is intended as an aid in the diagnosis of influenza from Nasopharyngeal swab specimens and should not be used as a sole basis for treatment. Nasal washings and aspirates are unacceptable for Xpert Xpress SARS-CoV-2/FLU/RSV testing.  Fact Sheet for Patients: BloggerCourse.com  Fact Sheet for Healthcare Providers: SeriousBroker.it  This test is not yet approved or cleared by the [place] FDA and has been authorized for detection and/or diagnosis of SARS-CoV-2 by FDA under an Emergency Use Authorization (EUA). This EUA will remain in effect (meaning this test can be used) for the duration of the COVID-19 declaration under Section 564(b)(1) of the Act, 21 U.S.C. section 360bbb-3(b)(1), unless the authorization is terminated or revoked.     Resp Syncytial Virus by  PCR NEGATIVE NEGATIVE Final    Comment: (NOTE) Fact Sheet  for Patients: BloggerCourse.com  Fact Sheet for Healthcare Providers: SeriousBroker.it  This test is not yet approved or cleared by the [place] FDA and has been authorized for detection and/or diagnosis of SARS-CoV-2 by FDA under an Emergency Use Authorization (EUA). This EUA will remain in effect (meaning this test can be used) for the duration of the COVID-19 declaration under Section 564(b)(1) of the Act, 21 U.S.C. section 360bbb-3(b)(1), unless the authorization is terminated or revoked.  Performed at [facility], [address], [place], [place] [address]   Aerobic/Anaerobic Culture w Gram Stain (surgical/deep wound)     Status: None (Preliminary result)   Collection Time: 06/23/23 11:25 PM   Specimen: Wound  Result Value Ref Range Status   Specimen Description WOUND  Final   Special Requests ABDOMEN  Final   Gram Stain   Final    FEW WBC PRESENT,BOTH PMN AND MONONUCLEAR FEW GRAM VARIABLE ROD Performed at [facility], [address] [place], [place] [address]    Culture   Final    MODERATE PSEUDOMONAS AERUGINOSA MODERATE ESCHERICHIA COLI NO ANAEROBES ISOLATED; CULTURE IN PROGRESS FOR 5 DAYS    Report Status PENDING  Incomplete   Organism ID, Bacteria PSEUDOMONAS AERUGINOSA  Final   Organism ID, Bacteria ESCHERICHIA COLI  Final      Susceptibility   Escherichia coli - MIC*    AMPICILLIN >=32 RESISTANT Resistant     CEFEPIME <=0.12 SENSITIVE Sensitive     CEFTAZIDIME <=1 SENSITIVE Sensitive     CEFTRIAXONE <=0.25 SENSITIVE Sensitive     CIPROFLOXACIN >=4 RESISTANT Resistant     GENTAMICIN >=16 RESISTANT Resistant     IMIPENEM <=0.25 SENSITIVE Sensitive     TRIMETH/SULFA >=320 RESISTANT Resistant     AMPICILLIN/SULBACTAM 8 SENSITIVE Sensitive     PIP/TAZO <=4 SENSITIVE Sensitive ug/mL    * MODERATE ESCHERICHIA COLI   Pseudomonas aeruginosa - MIC*    CEFTAZIDIME 4 SENSITIVE Sensitive      CIPROFLOXACIN <=0.25 SENSITIVE Sensitive     GENTAMICIN <=1 SENSITIVE Sensitive     IMIPENEM 2 SENSITIVE Sensitive     PIP/TAZO 8 SENSITIVE Sensitive ug/mL    CEFEPIME 2 SENSITIVE Sensitive     * MODERATE PSEUDOMONAS AERUGINOSA  Aerobic/Anaerobic Culture w Gram Stain (surgical/deep wound)     Status: None (Preliminary result)   Collection Time: 06/24/23  9:08 AM   Specimen: Path fluid; Body Fluid  Result Value Ref Range Status   Specimen Description FLUID  Final   Special Requests abdomen  Final   Gram Stain   Final    FEW WBC PRESENT, PREDOMINANTLY PMN NO ORGANISMS SEEN    Culture   Final    FEW ESCHERICHIA COLI CULTURE REINCUBATED FOR BETTER GROWTH Performed at [facility], [address] [place], [place] [address]    Report Status PENDING  Incomplete   Organism ID, Bacteria ESCHERICHIA COLI  Final      Susceptibility   Escherichia coli - MIC*    AMPICILLIN >=32 RESISTANT Resistant     CEFEPIME <=0.12 SENSITIVE Sensitive     CEFTAZIDIME <=1 SENSITIVE Sensitive     CEFTRIAXONE <=0.25 SENSITIVE Sensitive     CIPROFLOXACIN >=4 RESISTANT Resistant     GENTAMICIN >=16 RESISTANT Resistant     IMIPENEM <=0.25 SENSITIVE Sensitive     TRIMETH/SULFA >=320 RESISTANT Resistant     AMPICILLIN/SULBACTAM 16 INTERMEDIATE Intermediate  PIP/TAZO <=4 SENSITIVE Sensitive ug/mL    * FEW ESCHERICHIA COLI     Radiology Studies: No results found.  Scheduled Meds:  diphenhydrAMINE-zinc acetate   Topical TID   docusate sodium  200 mg Oral Daily   enoxaparin (LOVENOX) injection  40 mg Subcutaneous Q24H   fluticasone  1 spray Each Nare Daily   loratadine  10 mg Oral Daily   QUEtiapine  50 mg Oral QHS   senna-docusate  2 tablet Oral QHS   sodium chloride flush  3 mL Intravenous Q12H   sodium chloride flush  5 mL Intracatheter Q8H   valACYclovir  500 mg Oral BID   Continuous Infusions:  ceFEPime (MAXIPIME) IV 2 g (06/26/23 0924)   metronidazole 500 mg (06/26/23 1026)     LOS:  3 days   Time spent: 40 minutes  Carollee Herter, DO  [facility]  06/26/2023, 1:35 PM

## 2023-06-27 ENCOUNTER — Other Ambulatory Visit (HOSPITAL_COMMUNITY): Payer: Self-pay

## 2023-06-27 DIAGNOSIS — T8149XA Infection following a procedure, other surgical site, initial encounter: Secondary | ICD-10-CM | POA: Diagnosis not present

## 2023-06-27 DIAGNOSIS — A419 Sepsis, unspecified organism: Secondary | ICD-10-CM | POA: Diagnosis not present

## 2023-06-27 DIAGNOSIS — T148XXA Other injury of unspecified body region, initial encounter: Secondary | ICD-10-CM | POA: Diagnosis not present

## 2023-06-27 DIAGNOSIS — Z8619 Personal history of other infectious and parasitic diseases: Secondary | ICD-10-CM | POA: Diagnosis not present

## 2023-06-27 LAB — AEROBIC/ANAEROBIC CULTURE W GRAM STAIN (SURGICAL/DEEP WOUND)

## 2023-06-27 MED ORDER — VALACYCLOVIR HCL 500 MG PO TABS
500.0000 mg | ORAL_TABLET | Freq: Two times a day (BID) | ORAL | 0 refills | Status: AC
  Filled 2023-06-27: qty 20, 10d supply, fill #0

## 2023-06-27 MED ORDER — DIPHENHYDRAMINE-ZINC ACETATE 2-0.1 % EX CREA: TOPICAL_CREAM | Freq: Three times a day (TID) | CUTANEOUS | Status: AC

## 2023-06-27 MED ORDER — GABAPENTIN 100 MG PO CAPS
100.0000 mg | ORAL_CAPSULE | Freq: Four times a day (QID) | ORAL | 0 refills | Status: AC | PRN
  Filled 2023-06-27: qty 60, 5d supply, fill #0

## 2023-06-27 MED ORDER — CEFADROXIL 500 MG PO CAPS
1000.0000 mg | ORAL_CAPSULE | Freq: Two times a day (BID) | ORAL | 0 refills | Status: AC
  Filled 2023-06-27: qty 56, 20d supply, fill #0

## 2023-06-27 MED ORDER — CIPROFLOXACIN HCL 500 MG PO TABS
500.0000 mg | ORAL_TABLET | Freq: Two times a day (BID) | ORAL | 0 refills | Status: AC
  Filled 2023-06-27: qty 28, 14d supply, fill #0

## 2023-06-27 MED ORDER — SODIUM CHLORIDE 0.9% FLUSH
5.0000 mL | Freq: Three times a day (TID) | INTRAVENOUS | 0 refills | Status: AC
  Filled 2023-06-27: qty 230, 16d supply, fill #0

## 2023-06-27 MED ORDER — OXYCODONE-ACETAMINOPHEN 5-325 MG PO TABS
1.0000 | ORAL_TABLET | ORAL | 0 refills | Status: AC | PRN
  Filled 2023-06-27: qty 60, 5d supply, fill #0

## 2023-06-27 NOTE — Plan of Care (Signed)

## 2023-06-27 NOTE — Progress Notes (Signed)
PROGRESS NOTE    Brooke Rush  ZOX:[id number] DOB: 10/22/1975 DOA: 06/23/2023 PCP: Katrinka Blazing, MD  Subjective: Pt seen and examined. Pt seen by ID. They recommended cipro and duricef for 10 days.  IR will arrange for outpatient f/u appointment for drain management.   Hospital Course: HPI: Brooke Rush is a 48 y.o. female with medical history significant of panniculectomy at [facility] by Dr. Meredith Mody on 06/05/2023.  Patient seems to have done well till yesterday when she reports a new onset of purulence in the middle of her wound, see photo below.  Associated with fever as high as 102 F.  Patient unfortunately did not report to the ER yesterday and came today.  Patient denies any other associated symptoms such as vomiting diarrhea chest pain shortness of breath coughing any other rash on skin.   Patient reports since yesterday having an occipital area headache and visual blurring/blackening of visual fields in the medial aspect of both eyes. Onset since yesterday am. Denies trauma. No vomting.  Significant Events: Admitted 06/23/2023 with post-wound infection   Significant Labs: WBC 12.4, Hgb 13.1, plt 329, Na 136, K 3.5, BUN 6, scr 0.81  Significant Imaging Studies: CT abd  Extensive stranding and edema within the subcutaneous soft tissues of the anterior abdominal wall with fluid collection superficial to the rectus muscles measuring 15 mm maximum AP thickness over the right rectus and 15 mm maximum thickness over the left rectus and extends over a horizontal diameter of 20 cm. No strong rim enhancement. No internal gas. Findings could be secondary to postoperative change and postoperative fluid collection, but infection could also produce this appearance. 2. Hepatic steatosis.  Antibiotic Therapy: Anti-infectives (From admission, onward)    Start     Dose/Rate Route Frequency Ordered Stop   06/24/23 0800  vancomycin (VANCOREADY) IVPB 750 mg/150 mL  Status:   Discontinued        750 mg 150 mL/hr over 60 Minutes Intravenous Every 12 hours 06/23/23 2303 06/23/23 2315   06/24/23 0800  vancomycin (VANCOCIN) 750 mg in sodium chloride 0.9 % 250 mL IVPB        750 mg 250 mL/hr over 60 Minutes Intravenous Every 12 hours 06/23/23 2315     06/24/23 0100  piperacillin-tazobactam (ZOSYN) IVPB 3.375 g        3.375 g 12.5 mL/hr over 240 Minutes Intravenous Every 8 hours 06/23/23 2303     06/23/23 1700  vancomycin (VANCOCIN) IVPB 1000 mg/200 mL premix  Status:  Discontinued        1,000 mg 200 mL/hr over 60 Minutes Intravenous  Once 06/23/23 1649 06/23/23 1658   06/23/23 1700  piperacillin-tazobactam (ZOSYN) IVPB 3.375 g        3.375 g 100 mL/hr over 30 Minutes Intravenous  Once 06/23/23 1649 06/23/23 1818   06/23/23 1700  Vancomycin (VANCOCIN) 1,500 mg in sodium chloride 0.9 % 500 mL IVPB        1,500 mg 250 mL/hr over 120 Minutes Intravenous  Once 06/23/23 1658 06/23/23 2223       Procedures:   Consultants:     Assessment and Plan: * Wound infection 06-24-2023 see postoperative seroma. Continue with IV zosyn/vanco for now. 06-25-2023 prelim cultures growing pseudomonas. Changed IV abx to cefepime. Stopped vanco. Continued flagyl. 06-26-2023 wound cx growing E. Coli and Pseudomonas. ID consulted.  06-27-2023 ID recs Cipro 500 mg twice a day plus cefadroxil 1000 mg twice a day for 10 days . Pt  will need f/u with plastic surgery, IR after discharge.  Postoperative wound infection 06-24-2023 s/p IR placed drain. Has already had about 60 ml in JP bulb. Gram stain shows gram variable rods. Continue with zosyn and vanco for now. Pt asking if PICC line with long term IV abx were an option. I told her that final culture data needed to be obtained for making that kind of decision. Increase percocet to 5-10 mg q4h prn. 06-25-2023 increase neurontin to 100-300 mg q6h prn.  prelim cultures growing pseudomonas. Changed IV abx to cefepime. Stopped vanco.  Continued flagyl. 06-26-2023 pain controlled with oxy and neurontin. ID consulted for E. Coli and pseudomonas wound infection.  06-27-2023 continue with Cipro 500 mg twice a day plus cefadroxil 1000 mg twice a day for 10 days per ID recs. Continue with NS flushes to the drain. F/u with IR clinic. I'm going to reach out to Dr. Meredith Mody to see if she has any recs.  History of herpes genitalis 06-26-2023 pt feels like she is about to have outbreak of genital herpes. Will start valtrex 500 mg bid.  06-18-2023 continue valtrex at home.  Sepsis (HCC) On admission. Present on admission. Evident as leukocytosis as well as tachycardia.  Source of infection is felt to be wound infection of patient's prior surgical site.  With associated inflammation in the subcutaneous area.  There are abdominal wall fluid collection superficial to the rectus muscle seen on CAT scan measuring 15 mm over the right rectus muscle.  And 15 mm over the left rectus muscle.  This was reviewed by the surgeon Dr. Meredith Mody.  And at this time antibiotic therapy is suggested.  I do feel that the collection is in communication with superficial skin due to the wound dehiscence site as it is visible on the photo here.  At this time continue with vancomycin and Zosyn.  Wound cultures ordered.  Patient's blood pressure is soft.  Additional 1 L bolus ordered.  Patient declined transfer to [facility] at this time.  I will put in an IR consult to see if they are able to drain any collection. 06-24-2023 continue to monitor.  06-25-2023 present on admission, due to post-op wound infection. WBC normalized now.  Obesity, Class II, BMI 35-39.9 06-24-2023 BMI 36.9  Visual field defects On admission, Not noted on conforntation exam. Check mri brain  06-24-2023 MRI brain negative for CVA.   DVT prophylaxis: enoxaparin (LOVENOX) injection 40 mg Start: 06/24/23 0800 SCDs Start: 06/23/23 2022    Code Status: Full Code Family Communication:  no family at bedside. Pt is decisional Disposition Plan: return home Reason for continuing need for hospitalization: medically stable for Dc.  Objective: Vitals:   06/26/23 1636 06/26/23 2117 06/27/23 0442 06/27/23 0952  BP: 136/65 125/73 (!) 97/52 (!) 114/59  Pulse: (!) 102 (!) 103 79 96  Resp: 18 18 18 18   Temp: 98.6 F (37 C) 98.7 F (37.1 C) 97.9 F (36.6 C) 98 F (36.7 C)  TempSrc: Oral Oral    SpO2: 99% 98% 95% 96%  Weight:      Height:        Intake/Output Summary (Last 24 hours) at 06/27/2023 1324 Last data filed at 06/27/2023 1322 Gross per 24 hour  Intake 503.5 ml  Output 52 ml  Net 451.5 ml   Filed Weights   06/23/23 1403 06/24/23 2035  Weight: 97.5 kg 100.8 kg    Examination:  Physical Exam Vitals and nursing note reviewed.  Constitutional:  General: She is not in acute distress.    Appearance: She is not toxic-appearing or diaphoretic.  HENT:     Head: Normocephalic and atraumatic.     Nose: Nose normal.  Cardiovascular:     Rate and Rhythm: Normal rate and regular rhythm.  Pulmonary:     Effort: Pulmonary effort is normal.     Breath sounds: Normal breath sounds.  Skin:    Capillary Refill: Capillary refill takes less than 2 seconds.     Comments: Abd JP drain intact  Neurological:     General: No focal deficit present.     Mental Status: She is alert and oriented to person, place, and time.     Data Reviewed: I have personally reviewed following labs and imaging studies  CBC: Recent Labs  Lab 06/23/23 1412 06/23/23 1421 06/23/23 1604 06/24/23 0413 06/25/23 0743  WBC 12.4*  --   --  10.0 7.7  NEUTROABS 9.8*  --   --   --  5.4  HGB 13.1 7.8* 12.2  12.2 10.7* 11.0*  HCT 38.7 23.0* 36.0  36.0 33.0* 33.8*  MCV 94.6  --   --  97.9 95.5  PLT 329  --   --  288 321   Basic Metabolic Panel: Recent Labs  Lab 06/23/23 1412 06/23/23 1421 06/23/23 1604 06/24/23 0413 06/25/23 0743  NA 136 138 136  136 133* 139  K 3.5 7.7* 6.1*   6.0* 3.3* 4.4  CL 103  --  105 105 106  CO2 24  --   --  21* 27  GLUCOSE 160*  --  108* 176* 119*  BUN 6  --  7 6 7   CREATININE 0.81  --  0.60 0.73 0.75  CALCIUM 8.7*  --   --  7.8* 8.3*   GFR: Estimated Creatinine Clearance: 101.4 mL/min (by C-G formula based on SCr of 0.75 mg/dL). Liver Function Tests: Recent Labs  Lab 06/23/23 1412 06/25/23 0743  AST 14* 12*  ALT 10 12  ALKPHOS 60 52  BILITOT 0.7 0.4  PROT 6.6 6.0*  ALBUMIN 3.2* 2.6*   Recent Labs  Lab 06/23/23 1412  LIPASE 20   Coagulation Profile: Recent Labs  Lab 06/24/23 0413  INR 1.3*   Sepsis Labs: Recent Labs  Lab 06/23/23 1420  LATICACIDVEN 1.7    Recent Results (from the past 240 hours)  Resp panel by RT-PCR (RSV, Flu A&B, Covid) Anterior Nasal Swab     Status: None   Collection Time: 06/23/23  5:38 PM   Specimen: Anterior Nasal Swab  Result Value Ref Range Status   SARS Coronavirus 2 by RT PCR NEGATIVE NEGATIVE Final   Influenza A by PCR NEGATIVE NEGATIVE Final   Influenza B by PCR NEGATIVE NEGATIVE Final    Comment: (NOTE) The Xpert Xpress SARS-CoV-2/FLU/RSV plus assay is intended as an aid in the diagnosis of influenza from Nasopharyngeal swab specimens and should not be used as a sole basis for treatment. Nasal washings and aspirates are unacceptable for Xpert Xpress SARS-CoV-2/FLU/RSV testing.  Fact Sheet for Patients: BloggerCourse.com  Fact Sheet for Healthcare Providers: SeriousBroker.it  This test is not yet approved or cleared by the [place] FDA and has been authorized for detection and/or diagnosis of SARS-CoV-2 by FDA under an Emergency Use Authorization (EUA). This EUA will remain in effect (meaning this test can be used) for the duration of the COVID-19 declaration under Section 564(b)(1) of the Act, 21 U.S.C. section 360bbb-3(b)(1), unless the authorization is  terminated or revoked.     Resp Syncytial Virus by PCR  NEGATIVE NEGATIVE Final    Comment: (NOTE) Fact Sheet for Patients: BloggerCourse.com  Fact Sheet for Healthcare Providers: SeriousBroker.it  This test is not yet approved or cleared by the [place] FDA and has been authorized for detection and/or diagnosis of SARS-CoV-2 by FDA under an Emergency Use Authorization (EUA). This EUA will remain in effect (meaning this test can be used) for the duration of the COVID-19 declaration under Section 564(b)(1) of the Act, 21 U.S.C. section 360bbb-3(b)(1), unless the authorization is terminated or revoked.  Performed at [facility], [address] [place], [place] [address]   Aerobic/Anaerobic Culture w Gram Stain (surgical/deep wound)     Status: None   Collection Time: 06/23/23 11:25 PM   Specimen: Wound  Result Value Ref Range Status   Specimen Description WOUND  Final   Special Requests ABDOMEN  Final   Gram Stain   Final    FEW WBC PRESENT,BOTH PMN AND MONONUCLEAR FEW GRAM VARIABLE ROD    Culture   Final    MODERATE PSEUDOMONAS AERUGINOSA MODERATE ESCHERICHIA COLI NO ANAEROBES ISOLATED Performed at [facility], [address], [place], [place] [address]    Report Status 06/27/2023 FINAL  Final   Organism ID, Bacteria PSEUDOMONAS AERUGINOSA  Final   Organism ID, Bacteria ESCHERICHIA COLI  Final   Organism ID, Bacteria ESCHERICHIA COLI  Final      Susceptibility   Escherichia coli - MIC*    AMPICILLIN >=32 RESISTANT Resistant     CEFEPIME <=0.12 SENSITIVE Sensitive     CEFTAZIDIME <=1 SENSITIVE Sensitive     CEFTRIAXONE <=0.25 SENSITIVE Sensitive     CIPROFLOXACIN >=4 RESISTANT Resistant     GENTAMICIN >=16 RESISTANT Resistant     IMIPENEM <=0.25 SENSITIVE Sensitive     TRIMETH/SULFA >=320 RESISTANT Resistant     AMPICILLIN/SULBACTAM 8 SENSITIVE Sensitive     PIP/TAZO <=4 SENSITIVE Sensitive ug/mL   Escherichia coli - KIRBY BAUER*    CEFAZOLIN  INTERMEDIATE Intermediate     * MODERATE ESCHERICHIA COLI    MODERATE ESCHERICHIA COLI   Pseudomonas aeruginosa - MIC*    CEFTAZIDIME 4 SENSITIVE Sensitive     CIPROFLOXACIN <=0.25 SENSITIVE Sensitive     GENTAMICIN <=1 SENSITIVE Sensitive     IMIPENEM 2 SENSITIVE Sensitive     PIP/TAZO 8 SENSITIVE Sensitive ug/mL    CEFEPIME 2 SENSITIVE Sensitive     * MODERATE PSEUDOMONAS AERUGINOSA  Aerobic/Anaerobic Culture w Gram Stain (surgical/deep wound)     Status: None (Preliminary result)   Collection Time: 06/24/23  9:08 AM   Specimen: Path fluid; Body Fluid  Result Value Ref Range Status   Specimen Description FLUID  Final   Special Requests abdomen  Final   Gram Stain   Final    FEW WBC PRESENT, PREDOMINANTLY PMN NO ORGANISMS SEEN Performed at [facility], [address], [place], [place] [address]    Culture   Final    FEW ESCHERICHIA COLI NO ANAEROBES ISOLATED; CULTURE IN PROGRESS FOR 5 DAYS    Report Status PENDING  Incomplete   Organism ID, Bacteria ESCHERICHIA COLI  Final   Organism ID, Bacteria ESCHERICHIA COLI  Final      Susceptibility   Escherichia coli - MIC*    AMPICILLIN >=32 RESISTANT Resistant     CEFEPIME <=0.12 SENSITIVE Sensitive     CEFTAZIDIME <=1 SENSITIVE Sensitive     CEFTRIAXONE <=  0.25 SENSITIVE Sensitive     CIPROFLOXACIN >=4 RESISTANT Resistant     GENTAMICIN >=16 RESISTANT Resistant     IMIPENEM <=0.25 SENSITIVE Sensitive     TRIMETH/SULFA >=320 RESISTANT Resistant     AMPICILLIN/SULBACTAM 16 INTERMEDIATE Intermediate     PIP/TAZO <=4 SENSITIVE Sensitive ug/mL   Escherichia coli - KIRBY BAUER*    CEFAZOLIN INTERMEDIATE Intermediate     * FEW ESCHERICHIA COLI    FEW ESCHERICHIA COLI     Radiology Studies: No results found.  Scheduled Meds:  diphenhydrAMINE-zinc acetate   Topical TID   docusate sodium  200 mg Oral Daily   enoxaparin (LOVENOX) injection  40 mg Subcutaneous Q24H   fluticasone  1 spray Each Nare Daily   loratadine  10 mg  Oral Daily   QUEtiapine  50 mg Oral QHS   senna-docusate  2 tablet Oral QHS   sodium chloride flush  3 mL Intravenous Q12H   sodium chloride flush  5 mL Intracatheter Q8H   valACYclovir  500 mg Oral BID   Continuous Infusions:  ceFEPime (MAXIPIME) IV 2 g (06/27/23 1046)   metronidazole 500 mg (06/27/23 0826)     LOS: 4 days   Time spent: 45 minutes  Carollee Herter, DO  [facility]  06/27/2023, 1:24 PM

## 2023-06-27 NOTE — Progress Notes (Addendum)
Referring Physician(s): Dr Pierre Bali  Supervising Physician: Marliss Coots  Patient Status:  [facility] - In-pt  Chief Complaint:   Panniculectomy with lower abdominal fluid collection   Subjective:  Drain placed in IR 06/24/23 Doing well Less pain  Allergies: Latex, Other, and Nsaids  Medications: Prior to Admission medications   Medication Sig Start Date End Date Taking? Authorizing Provider  albuterol (PROVENTIL HFA) 108 (90 Base) MCG/ACT inhaler Inhale into the lungs. 03/10/17 06/22/24 Yes [provider]  cetirizine HCl (ZYRTEC) 1 MG/ML solution Take 10 mg by mouth daily.   Yes [provider]  docusate sodium (COLACE) 100 MG capsule Take 200 mg by mouth daily.   Yes [provider]  fluticasone (FLONASE) 50 MCG/ACT nasal spray Place 1 spray into both nostrils daily.   Yes [provider]  HYDROcodone-acetaminophen (NORCO/VICODIN) 5-325 MG tablet Take 1 tablet by mouth every 6 (six) hours as needed for moderate pain (pain score 4-6).   Yes [provider]  LORazepam (ATIVAN) 1 MG tablet Take 1 mg by mouth daily as needed for anxiety.   Yes [provider]  ondansetron (ZOFRAN) 4 MG tablet Take 4 mg by mouth every 8 (eight) hours as needed for nausea or vomiting.   Yes [provider]  oxyCODONE-acetaminophen (PERCOCET/ROXICET) 5-325 MG tablet Take 1 tablet by mouth every 6 (six) hours as needed for severe pain (pain score 7-10).   Yes [provider]  Phenylephrine-APAP-guaiFENesin (TYLENOL SINUS SEVERE PO) Take 1 tablet by mouth every 6 (six) hours as needed (URI symptoms).   Yes [provider]  QUEtiapine (SEROQUEL) 50 MG tablet Take 50 mg by mouth. 06/08/20 06/22/24 Yes [provider]  simethicone (MYLICON) 80 MG chewable tablet Chew 80 mg by mouth every 6 (six) hours as needed for flatulence.   Yes [provider]  tiZANidine (ZANAFLEX) 4 MG capsule Take 4 mg by mouth 3 (three) times  daily as needed for muscle spasms.   Yes [provider]  White Petrolatum-Mineral Oil (SYSTANE NIGHTTIME OP) Apply 1 drop to eye at bedtime.   Yes [provider]     Vital Signs: BP (!) 114/59   Pulse 96   Temp 97.9 F (36.6 C)   Resp 18   Ht 5\' 4"  (1.626 m)   Wt 222 lb 3.6 oz (100.8 kg)   SpO2 96%   BMI 38.14 kg/m   Physical Exam Vitals reviewed.  Skin:    General: Skin is warm.     Comments: Site is c/d/I; no bleeding; no infection OP serous Flushes easily Ecoli  Neurological:     Mental Status: She is alert.     Imaging: IR IMAGE GUIDED DRAINAGE BY PERCUTANEOUS CATHETER Result Date: 06/24/2023 CLINICAL DATA:  Panniculectomy with lower abdominal fluid collection EXAM: ULTRASOUND and FLUOROSCOPIC-GUIDED DRAINAGE CATHETER PLACEMENT into LOWER ABDOMINAL WALL FLUID COLLECTION COMPARISON:  CT AP, 06/23/2023 MEDICATIONS: The patient was on scheduled antibiotics. ANESTHESIA/SEDATION: Local anesthetic and single agent sedation was employed during this procedure. A total of fentanyl 100 mcg was administered intravenously. The patient's level of consciousness and vital signs were monitored continuously by radiology nursing throughout the procedure under my direct supervision. FLUOROSCOPY: Fluoroscopic dose; 1 mGy COMPLICATIONS: None immediate. PROCEDURE: Informed written consent was obtained from the patient and/or patient's representative after a discussion of the risks, benefits and alternatives to treatment. Pre-procedural ultrasound scanning demonstrated subcutaneous fluid collection at the lower abdomen, underlying the incision. A timeout was performed prior to the  initiation of the procedure. The lower abdomen was prepped and draped in the usual sterile fashion. The overlying soft tissues were anesthetized with 1% lidocaine with epinephrine. Under direct ultrasound guidance, a 18 gauge trocar needle was advanced, then a short Amplatz super stiff wire was coiled  within the collection. Ultrasound images were saved for procedural documentation purposes. Fluoroscopic evaluation of the wire demonstrated but the wire crossed the midline. The tract was serially dilated allowing placement of a 12 Fr drainage catheter. Final catheter positioning was confirmed with a fluoroscopic radiographic image. 10 mL of seromatous fluid was aspirated. A representative sample of aspirated fluid was capped and sent to the laboratory for analysis. The tube was connected to a bulb suction and sutured in place. A dressing was placed. The patient tolerated the procedure well without immediate post procedural complication. FINDINGS: Lower abdominal fluid collection in communication with the guidewire crossing the midline. A unilateral drainage catheter was therefore recommended. IMPRESSION: Successful [place] and fluoroscopic-guided drainage catheter placement into lower quadrant abdominal wall fluid collection. 10 mL of seromatous fluid was aspirated and a representative aspirated sample was sent to the laboratory as requested by the ordering clinical team. RECOMMENDATIONS: The patient will return to Vascular Interventional Radiology (VIR) for routine drainage catheter evaluation in 7-10 days. Roanna Banning, MD [facility] Radiology Electronically Signed   By: Roanna Banning M.D.   On: 06/24/2023 14:47   IR US Guidance Result Date: 06/24/2023 CLINICAL DATA:  Panniculectomy with lower abdominal fluid collection EXAM: ULTRASOUND and FLUOROSCOPIC-GUIDED DRAINAGE CATHETER PLACEMENT into LOWER ABDOMINAL WALL FLUID COLLECTION COMPARISON:  CT AP, 06/23/2023 MEDICATIONS: The patient was on scheduled antibiotics. ANESTHESIA/SEDATION: Local anesthetic and single agent sedation was employed during this procedure. A total of fentanyl 100 mcg was administered intravenously. The patient's level of consciousness and vital signs were monitored continuously by radiology  nursing throughout the procedure under my direct supervision. FLUOROSCOPY: Fluoroscopic dose; 1 mGy COMPLICATIONS: None immediate. PROCEDURE: Informed written consent was obtained from the patient and/or patient's representative after a discussion of the risks, benefits and alternatives to treatment. Pre-procedural ultrasound scanning demonstrated subcutaneous fluid collection at the lower abdomen, underlying the incision. A timeout was performed prior to the initiation of the procedure. The lower abdomen was prepped and draped in the usual sterile fashion. The overlying soft tissues were anesthetized with 1% lidocaine with epinephrine. Under direct ultrasound guidance, a 18 gauge trocar needle was advanced, then a short Amplatz super stiff wire was coiled within the collection. Ultrasound images were saved for procedural documentation purposes. Fluoroscopic evaluation of the wire demonstrated but the wire crossed the midline. The tract was serially dilated allowing placement of a 12 Fr drainage catheter. Final catheter positioning was confirmed with a fluoroscopic radiographic image. 10 mL of seromatous fluid was aspirated. A representative sample of aspirated fluid was capped and sent to the laboratory for analysis. The tube was connected to a bulb suction and sutured in place. A dressing was placed. The patient tolerated the procedure well without immediate post procedural complication. FINDINGS: Lower abdominal fluid collection in communication with the guidewire crossing the midline. A unilateral drainage catheter was therefore recommended. IMPRESSION: Successful [place] and fluoroscopic-guided drainage catheter placement into lower quadrant abdominal wall fluid collection. 10 mL of seromatous fluid was aspirated and a representative aspirated sample was sent to the laboratory as requested by the ordering clinical team. RECOMMENDATIONS: The patient will return to Vascular Interventional Radiology (VIR) for routine  drainage catheter evaluation in 7-10  days. Roanna Banning, MD [facility] Radiology Electronically Signed   By: Roanna Banning M.D.   On: 06/24/2023 14:47   MR BRAIN WO CONTRAST Result Date: 06/24/2023 CLINICAL DATA:  Stroke suspected EXAM: MRI HEAD WITHOUT CONTRAST TECHNIQUE: Multiplanar, multiecho pulse sequences of the brain and surrounding structures were obtained without intravenous contrast. COMPARISON:  None Available. FINDINGS: Brain: No acute infarct, mass effect or extra-axial collection. No acute or chronic hemorrhage. Normal white matter signal, parenchymal volume and CSF spaces. The midline structures are normal. Vascular: Normal flow voids. Skull and upper cervical spine: Normal calvarium and skull base. Visualized upper cervical spine and soft tissues are normal. Sinuses/Orbits:No paranasal sinus fluid levels or advanced mucosal thickening. No mastoid or middle ear effusion. Normal orbits. IMPRESSION: Normal brain MRI. Electronically Signed   By: Deatra Robinson M.D.   On: 06/24/2023 02:51   CT ABDOMEN PELVIS W CONTRAST Result Date: 06/23/2023 CLINICAL DATA:  Abdomen pain EXAM: CT ABDOMEN AND PELVIS WITH CONTRAST TECHNIQUE: Multidetector CT imaging of the abdomen and pelvis was performed using the standard protocol following bolus administration of intravenous contrast. RADIATION DOSE REDUCTION: This exam was performed according to the departmental dose-optimization program which includes automated exposure control, adjustment of the mA and/or kV according to patient size and/or use of iterative reconstruction technique. CONTRAST:  75mL OMNIPAQUE IOHEXOL 350 MG/ML SOLN COMPARISON:  None Available. FINDINGS: Lower chest: Lung bases demonstrate no acute airspace disease. Hepatobiliary: Hepatic steatosis. Cholecystectomy. No biliary dilatation Pancreas: Unremarkable. No pancreatic ductal dilatation or surrounding inflammatory changes. Spleen: Normal in size  without focal abnormality. Adrenals/Urinary Tract: Adrenal glands are unremarkable. Kidneys are normal, without renal calculi, focal lesion, or hydronephrosis. Bladder is unremarkable. Stomach/Bowel: Stomach is within normal limits. Appendix appears normal. No evidence of bowel wall thickening, distention, or inflammatory changes. Vascular/Lymphatic: No significant vascular findings are present. No enlarged abdominal or pelvic lymph nodes. Reproductive: Uterus and bilateral adnexa are unremarkable. Other: Negative for pelvic effusion or free air. Extensive stranding and edema within the subcutaneous soft tissues of the anterior abdominal wall with fluid collection superficial to the rectus, this measures 15 mm maximum AP thickness over the right rectus and 15 mm maximum thickness over the left rectus and extends over a horizontal diameter of 20 cm. No strong rim enhancement. No internal gas. Few clips in the region. Soft tissue wound or defect along the midline inferior 21 abdominal wall Nonspecific mild stranding within the mesentery root with small nodes. Musculoskeletal: No acute or suspicious osseous abnormality IMPRESSION: 1. Extensive stranding and edema within the subcutaneous soft tissues of the anterior abdominal wall with fluid collection superficial to the rectus muscles measuring 15 mm maximum AP thickness over the right rectus and 15 mm maximum thickness over the left rectus and extends over a horizontal diameter of 20 cm. No strong rim enhancement. No internal gas. Findings could be secondary to postoperative change and postoperative fluid collection, but infection could also produce this appearance. 2. Hepatic steatosis. Electronically Signed   By: Jasmine Pang M.D.   On: 06/23/2023 19:02   DG Abd Portable 1V Result Date: 06/23/2023 CLINICAL DATA:  Pain EXAM: PORTABLE ABDOMEN - 1 VIEW COMPARISON:  06/15/2007 FINDINGS: The bowel gas pattern is normal. Moderate volume stool throughout the colon.  Cholecystectomy clips. No radio-opaque calculi or other significant radiographic abnormality are seen. IMPRESSION: Nonobstructive bowel gas pattern. Moderate volume stool throughout the colon. Electronically Signed   By: Duanne Guess D.O.   On: 06/23/2023 15:32  Labs:  CBC: Recent Labs    06/23/23 1412 06/23/23 1421 06/23/23 1604 06/24/23 0413 06/25/23 0743  WBC 12.4*  --   --  10.0 7.7  HGB 13.1 7.8* 12.2  12.2 10.7* 11.0*  HCT 38.7 23.0* 36.0  36.0 33.0* 33.8*  PLT 329  --   --  288 321    COAGS: Recent Labs    06/24/23 0413  INR 1.3*  APTT 35    BMP: Recent Labs    06/23/23 1412 06/23/23 1421 06/23/23 1604 06/24/23 0413 06/25/23 0743  NA 136 138 136  136 133* 139  K 3.5 7.7* 6.1*  6.0* 3.3* 4.4  CL 103  --  105 105 106  CO2 24  --   --  21* 27  GLUCOSE 160*  --  108* 176* 119*  BUN 6  --  7 6 7   CALCIUM 8.7*  --   --  7.8* 8.3*  CREATININE 0.81  --  0.60 0.73 0.75  GFRNONAA >60  --   --  >60 >60    LIVER FUNCTION TESTS: Recent Labs    06/23/23 1412 06/25/23 0743  BILITOT 0.7 0.4  AST 14* 12*  ALT 10 12  ALKPHOS 60 52  PROT 6.6 6.0*  ALBUMIN 3.2* 2.6*   Drain Location: low abd wall Size: Fr size: 12 Fr Date of placement: 06/24/23  Currently to: Drain collection device: suction bulb 24 hour output:  Output by Drain (mL) 06/25/23 0701 - 06/25/23 1900 06/25/23 1901 - 06/26/23 0700 06/26/23 0701 - 06/26/23 1900 06/26/23 1901 - 06/27/23 0700 06/27/23 0701 - 06/27/23 0954  Closed System Drain 1 Right RLQ Bulb (JP) 12 Fr. 45 60 20 2     Interval imaging/drain manipulation:  none  Current examination: Flushes/aspirates easily.  Insertion site unremarkable. Suture and stat lock in place. Dressed appropriately.  Site is clean and dry; NT No sign of infection  Plan: Continue TID flushes with 5 cc NS. Record output Q shift. Dressing changes QD or PRN if soiled.  Call IR APP or on call IR MD if difficulty flushing or sudden change in  drain output.  Repeat imaging/possible drain injection once output < 10 mL/QD (excluding flush material). Consideration for drain removal if output is < 10 mL/QD (excluding flush material), pending discussion with the providing surgical service.  Discharge planning: Please contact IR APP or on call IR MD prior to patient d/c to ensure appropriate follow up plans are in place. Typically patient will follow up with IR clinic 10-14 days post d/c for repeat imaging/possible drain injection. IR scheduler will contact patient with date/time of appointment. Patient will need to flush drain QD with 5 cc NS, record output QD, dressing changes every 2-3 days or earlier if soiled.   IR will continue to follow - please call with questions or concerns.   Assessment and Plan:  Abd wall drain in place Follows with ID Will need Ct when OP 10 cc or lower/24 hours If goes home with drain--- pt will hear from IR for OP fu appt   Electronically Signed: Robet Leu, PA-C 06/27/2023, 9:54 AM   I spent a total of 15 Minutes at the the patient's bedside AND on the patient's hospital floor or unit, greater than 50% of which was counseling/coordinating care for  Low abd wall seroma

## 2023-06-27 NOTE — Discharge Summary (Addendum)
[facility] Phys[facility] Discharge Summary   Patient name: Brooke Rush  Admit date:     06/23/2023  Discharge date: 06/27/2023  Attending Physician: Nolberto Hanlon [[id number]]  Discharge Physician: Carollee Herter   PCP: Katrinka Blazing, MD  Admitted From: Home  Disposition:  Home  Recommendations for Outpatient Follow-up:  Follow up with PCP in 1-2 weeks Follow up with plastic surgery Dr. Ila Mcgill) as scheduled Follow up with Interventional Radiology in 1-2 weeks.   Home Health:Yes. RN/NA Equipment/Devices: None    Discharge Condition:Stable CODE STATUS:FULL Diet recommendation: Heart Healthy Fluid Restriction: None  Hospital Summary: HPI: Brooke Rush is a 48 y.o. female with medical history significant of panniculectomy at [facility] by Dr. Meredith Mody on 06/05/2023.  Patient seems to have done well till yesterday when she reports a new onset of purulence in the middle of her wound, see photo below.  Associated with fever as high as 102 F.  Patient unfortunately did not report to the ER yesterday and came today.  Patient denies any other associated symptoms such as vomiting diarrhea chest pain shortness of breath coughing any other rash on skin.   Patient reports since yesterday having an occipital area headache and visual blurring/blackening of visual fields in the medial aspect of both eyes. Onset since yesterday am. Denies trauma. No vomting.  Significant Events: Admitted 06/23/2023 with post-wound infection   Significant Labs: WBC 12.4, Hgb 13.1, plt 329, Na 136, K 3.5, BUN 6, scr 0.81  Significant Imaging Studies: CT abd  Extensive stranding and edema within the subcutaneous soft tissues of the anterior abdominal wall with fluid collection superficial to the rectus muscles measuring 15 mm maximum AP thickness over the right rectus and 15 mm maximum thickness over the left rectus and extends over a horizontal diameter of 20 cm. No strong rim enhancement. No  internal gas. Findings could be secondary to postoperative change and postoperative fluid collection, but infection could also produce this appearance. 2. Hepatic steatosis.  Antibiotic Therapy: Anti-infectives (From admission, onward)    Start     Dose/Rate Route Frequency Ordered Stop   06/24/23 0800  vancomycin (VANCOREADY) IVPB 750 mg/150 mL  Status:  Discontinued        750 mg 150 mL/hr over 60 Minutes Intravenous Every 12 hours 06/23/23 2303 06/23/23 2315   06/24/23 0800  vancomycin (VANCOCIN) 750 mg in sodium chloride 0.9 % 250 mL IVPB        750 mg 250 mL/hr over 60 Minutes Intravenous Every 12 hours 06/23/23 2315     06/24/23 0100  piperacillin-tazobactam (ZOSYN) IVPB 3.375 g        3.375 g 12.5 mL/hr over 240 Minutes Intravenous Every 8 hours 06/23/23 2303     06/23/23 1700  vancomycin (VANCOCIN) IVPB 1000 mg/200 mL premix  Status:  Discontinued        1,000 mg 200 mL/hr over 60 Minutes Intravenous  Once 06/23/23 1649 06/23/23 1658   06/23/23 1700  piperacillin-tazobactam (ZOSYN) IVPB 3.375 g        3.375 g 100 mL/hr over 30 Minutes Intravenous  Once 06/23/23 1649 06/23/23 1818   06/23/23 1700  Vancomycin (VANCOCIN) 1,500 mg in sodium chloride 0.9 % 500 mL IVPB        1,500 mg 250 mL/hr over 120 Minutes Intravenous  Once 06/23/23 1658 06/23/23 2223       Procedures:   Consultants: ID   Hospital Course by Problem: * Wound infection 06-24-2023 see postoperative seroma.  Continue with IV zosyn/vanco for now. 06-25-2023 prelim cultures growing pseudomonas. Changed IV abx to cefepime. Stopped vanco. Continued flagyl. 06-26-2023 wound cx growing E. Coli and Pseudomonas. ID consulted.  06-27-2023 ID recs Cipro 500 mg twice a day plus cefadroxil 1000 mg twice a day for 10 days . Pt will need f/u with plastic surgery, IR after discharge.  Postoperative wound infection 06-24-2023 s/p IR placed drain. Has already had about 60 ml in JP bulb. Gram stain shows gram variable  rods. Continue with zosyn and vanco for now. Pt asking if PICC line with long term IV abx were an option. I told her that final culture data needed to be obtained for making that kind of decision. Increase percocet to 5-10 mg q4h prn. 06-25-2023 increase neurontin to 100-300 mg q6h prn.  prelim cultures growing pseudomonas. Changed IV abx to cefepime. Stopped vanco. Continued flagyl. 06-26-2023 pain controlled with oxy and neurontin. ID consulted for E. Coli and pseudomonas wound infection.  06-27-2023 continue with Cipro 500 mg twice a day plus cefadroxil 1000 mg twice a day for 10 days per ID recs. Continue with NS flushes to the drain. F/u with IR clinic. I'm going to reach out to Dr. Meredith Mody to see if she has any recs.  *update. I was contacted by Dr. Meredith Mody. Gave her update on her patient. She will f/u with patient in clinic next week.  History of herpes genitalis 06-26-2023 pt feels like she is about to have outbreak of genital herpes. Will start valtrex 500 mg bid.  06-18-2023 continue valtrex at home.  Sepsis (HCC) On admission. Present on admission. Evident as leukocytosis as well as tachycardia.  Source of infection is felt to be wound infection of patient's prior surgical site.  With associated inflammation in the subcutaneous area.  There are abdominal wall fluid collection superficial to the rectus muscle seen on CAT scan measuring 15 mm over the right rectus muscle.  And 15 mm over the left rectus muscle.  This was reviewed by the surgeon Dr. Meredith Mody.  And at this time antibiotic therapy is suggested.  I do feel that the collection is in communication with superficial skin due to the wound dehiscence site as it is visible on the photo here.  At this time continue with vancomycin and Zosyn.  Wound cultures ordered.  Patient's blood pressure is soft.  Additional 1 L bolus ordered.  Patient declined transfer to [facility] at this time.  I will put in an IR consult to see if they are  able to drain any collection. 06-24-2023 continue to monitor.  06-25-2023 present on admission, due to post-op wound infection. WBC normalized now.  Obesity, Class II, BMI 35-39.9 06-24-2023 BMI 36.9  Visual field defects On admission, Not noted on conforntation exam. Check mri brain  06-24-2023 MRI brain negative for CVA.    Discharge Diagnoses:  Principal Problem:   Wound infection Active Problems:   Postoperative wound infection   Sepsis (HCC)   History of herpes genitalis   Visual field defects   Obesity, Class II, BMI 35-39.9   Discharge Instructions  Discharge Instructions     Call MD for:  difficulty breathing, headache or visual disturbances   Complete by: As directed    Call MD for:  extreme fatigue   Complete by: As directed    Call MD for:  hives   Complete by: As directed    Call MD for:  persistant dizziness or light-headedness   Complete by:  As directed    Call MD for:  persistant nausea and vomiting   Complete by: As directed    Call MD for:  redness, tenderness, or signs of infection (pain, swelling, redness, odor or green/yellow discharge around incision site)   Complete by: As directed    Call MD for:  severe uncontrolled pain   Complete by: As directed    Call MD for:  temperature >100.4   Complete by: As directed    Diet - low sodium heart healthy   Complete by: As directed    Discharge instructions   Complete by: As directed    1. Follow up with plastic surgery Dr. Meredith Mody as scheduled 2. Follow up with primary care in 1-2 week after discharge 3. Interventional Radiology will schedule a follow up appointment with you for clinic.   Discharge wound care:   Complete by: As directed    Cleanse distal portion of wounds (open area) with saline, pat dry.  Cover with strip of silver hydrofiber, Aquacel Ag+ (lawson # [id number]) top with foam. Change daily once daily or if soiled.   Increase activity slowly   Complete by: As directed       Allergies  as of 06/27/2023       Reactions   Latex Hives   Other Anaphylaxis   Paper mask   Nsaids Nausea And Vomiting   Acid reflux        Medication List     STOP taking these medications    HYDROcodone-acetaminophen 5-325 MG tablet Commonly known as: NORCO/VICODIN       TAKE these medications    cefadroxil 500 MG capsule Commonly known as: DURICEF Take 2 capsules (1,000 mg total) by mouth 2 (two) times daily for 14 days.   cetirizine HCl 1 MG/ML solution Commonly known as: ZYRTEC Take 10 mg by mouth daily.   ciprofloxacin 500 MG tablet Commonly known as: Cipro Take 1 tablet (500 mg total) by mouth 2 (two) times daily for 14 days.   diphenhydrAMINE-zinc acetate cream Commonly known as: BENADRYL Apply topically 3 (three) times daily.   docusate sodium 100 MG capsule Commonly known as: COLACE Take 200 mg by mouth daily.   fluticasone 50 MCG/ACT nasal spray Commonly known as: FLONASE Place 1 spray into both nostrils daily.   gabapentin 100 MG capsule Commonly known as: NEURONTIN Take 1-3 capsules (100-300 mg total) by mouth every 6 (six) hours as needed for up to 5 days (1 tab for pain 4-5, 2 tabs for pain 6-7, 3 tabs for pain 8-10).   LORazepam 1 MG tablet Commonly known as: ATIVAN Take 1 mg by mouth daily as needed for anxiety.   ondansetron 4 MG tablet Commonly known as: ZOFRAN Take 4 mg by mouth every 8 (eight) hours as needed for nausea or vomiting.   oxyCODONE-acetaminophen 5-325 MG tablet Commonly known as: PERCOCET/ROXICET Take 1-2 tablets by mouth every 4 (four) hours as needed for up to 5 days for severe pain (pain score 7-10) or moderate pain (pain score 4-6). What changed:  how much to take when to take this reasons to take this   Proventil HFA 108 (90 Base) MCG/ACT inhaler Generic drug: albuterol Inhale into the lungs.   QUEtiapine 50 MG tablet Commonly known as: SEROQUEL Take 50 mg by mouth.   simethicone 80 MG chewable tablet Commonly  known as: MYLICON Chew 80 mg by mouth every 6 (six) hours as needed for flatulence.   sodium chloride flush 0.9 %  Soln Commonly known as: NS 5 mLs by Intracatheter route every 8 (eight) hours for 15 days.   SYSTANE NIGHTTIME OP Apply 1 drop to eye at bedtime.   tiZANidine 4 MG capsule Commonly known as: ZANAFLEX Take 4 mg by mouth 3 (three) times daily as needed for muscle spasms.   TYLENOL SINUS SEVERE PO Take 1 tablet by mouth every 6 (six) hours as needed (URI symptoms).   valACYclovir 500 MG tablet Commonly known as: VALTREX Take 1 tablet (500 mg total) by mouth 2 (two) times daily for 10 days.               Discharge Care Instructions  (From admission, onward)           Start     Ordered   06/27/23 0000  Discharge wound care:       Comments: Cleanse distal portion of wounds (open area) with saline, pat dry.  Cover with strip of silver hydrofiber, Aquacel Ag+ (lawson # [id number]) top with foam. Change daily once daily or if soiled.   06/27/23 1338            Follow-up Information     Mugweru, Cletis Athens, MD Follow up in 1 week(s).   Specialties: Interventional Radiology, Diagnostic Radiology, Radiology Why: pt will hear from IR OP Clinic for follow up time and date; continue to flush drain once daily with 5-10 cc sterile saline; record output; call [phone] if ay questions or concerns Contact information: [address] [place] [address] [phone]         Katrinka Blazing, MD Follow up in 1 week(s).   Contact information: [id number] [facility] [place] [address] [phone]                Allergies  Allergen Reactions   Latex Hives   Other Anaphylaxis    Paper mask   Nsaids Nausea And Vomiting    Acid reflux    Discharge Exam: Vitals:   06/27/23 0442 06/27/23 0952  BP: (!) 97/52 (!) 114/59  Pulse: 79 96  Resp: 18 18  Temp: 97.9 F (36.6 C) 98 F (36.7 C)  SpO2: 95% 96%     Physical Exam Vitals and nursing note reviewed.  Constitutional:      General: She is not in acute distress.    Appearance: She is not toxic-appearing or diaphoretic.  HENT:     Head: Normocephalic and atraumatic.     Nose: Nose normal.  Cardiovascular:     Rate and Rhythm: Normal rate and regular rhythm.  Pulmonary:     Effort: Pulmonary effort is normal.     Breath sounds: Normal breath sounds.  Skin:    Capillary Refill: Capillary refill takes less than 2 seconds.     Comments: Abd JP drain intact  Neurological:     General: No focal deficit present.     Mental Status: She is alert and oriented to person, place, and time.     The results of significant diagnostics from this hospitalization (including imaging, microbiology, ancillary and laboratory) are listed below for reference.    Microbiology: Recent Results (from the past 240 hours)  Resp panel by RT-PCR (RSV, Flu A&B, Covid) Anterior Nasal Swab     Status: None   Collection Time: 06/23/23  5:38 PM   Specimen: Anterior Nasal Swab  Result Value Ref Range Status   SARS Coronavirus 2 by RT PCR NEGATIVE NEGATIVE  Final   Influenza A by PCR NEGATIVE NEGATIVE Final   Influenza B by PCR NEGATIVE NEGATIVE Final    Comment: (NOTE) The Xpert Xpress SARS-CoV-2/FLU/RSV plus assay is intended as an aid in the diagnosis of influenza from Nasopharyngeal swab specimens and should not be used as a sole basis for treatment. Nasal washings and aspirates are unacceptable for Xpert Xpress SARS-CoV-2/FLU/RSV testing.  Fact Sheet for Patients: BloggerCourse.com  Fact Sheet for Healthcare Providers: SeriousBroker.it  This test is not yet approved or cleared by the [place] FDA and has been authorized for detection and/or diagnosis of SARS-CoV-2 by FDA under an Emergency Use Authorization (EUA). This EUA will remain in effect (meaning this test can be used) for the duration of  the COVID-19 declaration under Section 564(b)(1) of the Act, 21 U.S.C. section 360bbb-3(b)(1), unless the authorization is terminated or revoked.     Resp Syncytial Virus by PCR NEGATIVE NEGATIVE Final    Comment: (NOTE) Fact Sheet for Patients: BloggerCourse.com  Fact Sheet for Healthcare Providers: SeriousBroker.it  This test is not yet approved or cleared by the [place] FDA and has been authorized for detection and/or diagnosis of SARS-CoV-2 by FDA under an Emergency Use Authorization (EUA). This EUA will remain in effect (meaning this test can be used) for the duration of the COVID-19 declaration under Section 564(b)(1) of the Act, 21 U.S.C. section 360bbb-3(b)(1), unless the authorization is terminated or revoked.  Performed at [facility], [address] [place], [place] [address]   Aerobic/Anaerobic Culture w Gram Stain (surgical/deep wound)     Status: None   Collection Time: 06/23/23 11:25 PM   Specimen: Wound  Result Value Ref Range Status   Specimen Description WOUND  Final   Special Requests ABDOMEN  Final   Gram Stain   Final    FEW WBC PRESENT,BOTH PMN AND MONONUCLEAR FEW GRAM VARIABLE ROD    Culture   Final    MODERATE PSEUDOMONAS AERUGINOSA MODERATE ESCHERICHIA COLI NO ANAEROBES ISOLATED Performed at [facility] [place] [facility], [address], [place], [place] [address]    Report Status 06/27/2023 FINAL  Final   Organism ID, Bacteria PSEUDOMONAS AERUGINOSA  Final   Organism ID, Bacteria ESCHERICHIA COLI  Final   Organism ID, Bacteria ESCHERICHIA COLI  Final      Susceptibility   Escherichia coli - MIC*    AMPICILLIN >=32 RESISTANT Resistant     CEFEPIME <=0.12 SENSITIVE Sensitive     CEFTAZIDIME <=1 SENSITIVE Sensitive     CEFTRIAXONE <=0.25 SENSITIVE Sensitive     CIPROFLOXACIN >=4 RESISTANT Resistant     GENTAMICIN >=16 RESISTANT Resistant     IMIPENEM <=0.25 SENSITIVE Sensitive      TRIMETH/SULFA >=320 RESISTANT Resistant     AMPICILLIN/SULBACTAM 8 SENSITIVE Sensitive     PIP/TAZO <=4 SENSITIVE Sensitive ug/mL   Escherichia coli - KIRBY BAUER*    CEFAZOLIN INTERMEDIATE Intermediate     * MODERATE ESCHERICHIA COLI    MODERATE ESCHERICHIA COLI   Pseudomonas aeruginosa - MIC*    CEFTAZIDIME 4 SENSITIVE Sensitive     CIPROFLOXACIN <=0.25 SENSITIVE Sensitive     GENTAMICIN <=1 SENSITIVE Sensitive     IMIPENEM 2 SENSITIVE Sensitive     PIP/TAZO 8 SENSITIVE Sensitive ug/mL    CEFEPIME 2 SENSITIVE Sensitive     * MODERATE PSEUDOMONAS AERUGINOSA  Aerobic/Anaerobic Culture w Gram Stain (surgical/deep wound)     Status: None (Preliminary result)   Collection Time: 06/24/23  9:08 AM  Specimen: Path fluid; Body Fluid  Result Value Ref Range Status   Specimen Description FLUID  Final   Special Requests abdomen  Final   Gram Stain   Final    FEW WBC PRESENT, PREDOMINANTLY PMN NO ORGANISMS SEEN Performed at [facility], [address] [place], [place] [address]    Culture   Final    FEW ESCHERICHIA COLI NO ANAEROBES ISOLATED; CULTURE IN PROGRESS FOR 5 DAYS    Report Status PENDING  Incomplete   Organism ID, Bacteria ESCHERICHIA COLI  Final   Organism ID, Bacteria ESCHERICHIA COLI  Final      Susceptibility   Escherichia coli - MIC*    AMPICILLIN >=32 RESISTANT Resistant     CEFEPIME <=0.12 SENSITIVE Sensitive     CEFTAZIDIME <=1 SENSITIVE Sensitive     CEFTRIAXONE <=0.25 SENSITIVE Sensitive     CIPROFLOXACIN >=4 RESISTANT Resistant     GENTAMICIN >=16 RESISTANT Resistant     IMIPENEM <=0.25 SENSITIVE Sensitive     TRIMETH/SULFA >=320 RESISTANT Resistant     AMPICILLIN/SULBACTAM 16 INTERMEDIATE Intermediate     PIP/TAZO <=4 SENSITIVE Sensitive ug/mL   Escherichia coli - KIRBY BAUER*    CEFAZOLIN INTERMEDIATE Intermediate     * FEW ESCHERICHIA COLI    FEW ESCHERICHIA COLI     Labs: Basic Metabolic Panel: Recent Labs  Lab 06/23/23 1412 06/23/23 1421  06/23/23 1604 06/24/23 0413 06/25/23 0743  NA 136 138 136  136 133* 139  K 3.5 7.7* 6.1*  6.0* 3.3* 4.4  CL 103  --  105 105 106  CO2 24  --   --  21* 27  GLUCOSE 160*  --  108* 176* 119*  BUN 6  --  7 6 7   CREATININE 0.81  --  0.60 0.73 0.75  CALCIUM 8.7*  --   --  7.8* 8.3*   Liver Function Tests: Recent Labs  Lab 06/23/23 1412 06/25/23 0743  AST 14* 12*  ALT 10 12  ALKPHOS 60 52  BILITOT 0.7 0.4  PROT 6.6 6.0*  ALBUMIN 3.2* 2.6*   Recent Labs  Lab 06/23/23 1412  LIPASE 20    CBC: Recent Labs  Lab 06/23/23 1412 06/23/23 1421 06/23/23 1604 06/24/23 0413 06/25/23 0743  WBC 12.4*  --   --  10.0 7.7  NEUTROABS 9.8*  --   --   --  5.4  HGB 13.1 7.8* 12.2  12.2 10.7* 11.0*  HCT 38.7 23.0* 36.0  36.0 33.0* 33.8*  MCV 94.6  --   --  97.9 95.5  PLT 329  --   --  288 321   Urinalysis    Component Value Date/Time   COLORURINE YELLOW 06/23/2023 1459   APPEARANCEUR TURBID (A) 06/23/2023 1459   APPEARANCEUR Clear 04/13/2017 1317   LABSPEC 1.035 (H) 06/23/2023 1459   PHURINE 5.0 06/23/2023 1459   GLUCOSEU NEGATIVE 06/23/2023 1459   HGBUR NEGATIVE 06/23/2023 1459   BILIRUBINUR NEGATIVE 06/23/2023 1459   BILIRUBINUR negative 10/06/2020 1245   BILIRUBINUR Negative 04/13/2017 1317   KETONESUR NEGATIVE 06/23/2023 1459   PROTEINUR 30 (A) 06/23/2023 1459   UROBILINOGEN 1.0 10/06/2020 1245   NITRITE NEGATIVE 06/23/2023 1459   LEUKOCYTESUR NEGATIVE 06/23/2023 1459   Sepsis Labs Recent Labs  Lab 06/23/23 1412 06/24/23 0413 06/25/23 0743  WBC 12.4* 10.0 7.7   Microbiology Recent Results (from the past 240 hours)  Resp panel by RT-PCR (RSV, Flu A&B, Covid) Anterior Nasal Swab     Status:  None   Collection Time: 06/23/23  5:38 PM   Specimen: Anterior Nasal Swab  Result Value Ref Range Status   SARS Coronavirus 2 by RT PCR NEGATIVE NEGATIVE Final   Influenza A by PCR NEGATIVE NEGATIVE Final   Influenza B by PCR NEGATIVE NEGATIVE Final    Comment: (NOTE) The  Xpert Xpress SARS-CoV-2/FLU/RSV plus assay is intended as an aid in the diagnosis of influenza from Nasopharyngeal swab specimens and should not be used as a sole basis for treatment. Nasal washings and aspirates are unacceptable for Xpert Xpress SARS-CoV-2/FLU/RSV testing.  Fact Sheet for Patients: BloggerCourse.com  Fact Sheet for Healthcare Providers: SeriousBroker.it  This test is not yet approved or cleared by the [place] FDA and has been authorized for detection and/or diagnosis of SARS-CoV-2 by FDA under an Emergency Use Authorization (EUA). This EUA will remain in effect (meaning this test can be used) for the duration of the COVID-19 declaration under Section 564(b)(1) of the Act, 21 U.S.C. section 360bbb-3(b)(1), unless the authorization is terminated or revoked.     Resp Syncytial Virus by PCR NEGATIVE NEGATIVE Final    Comment: (NOTE) Fact Sheet for Patients: BloggerCourse.com  Fact Sheet for Healthcare Providers: SeriousBroker.it  This test is not yet approved or cleared by the [place] FDA and has been authorized for detection and/or diagnosis of SARS-CoV-2 by FDA under an Emergency Use Authorization (EUA). This EUA will remain in effect (meaning this test can be used) for the duration of the COVID-19 declaration under Section 564(b)(1) of the Act, 21 U.S.C. section 360bbb-3(b)(1), unless the authorization is terminated or revoked.  Performed at [facility], [address] [place], [place] [address]   Aerobic/Anaerobic Culture w Gram Stain (surgical/deep wound)     Status: None   Collection Time: 06/23/23 11:25 PM   Specimen: Wound  Result Value Ref Range Status   Specimen Description WOUND  Final   Special Requests ABDOMEN  Final   Gram Stain   Final    FEW WBC PRESENT,BOTH PMN AND MONONUCLEAR FEW GRAM VARIABLE ROD    Culture   Final     MODERATE PSEUDOMONAS AERUGINOSA MODERATE ESCHERICHIA COLI NO ANAEROBES ISOLATED Performed at [facility], [address] [place], [place] [address]    Report Status 06/27/2023 FINAL  Final   Organism ID, Bacteria PSEUDOMONAS AERUGINOSA  Final   Organism ID, Bacteria ESCHERICHIA COLI  Final   Organism ID, Bacteria ESCHERICHIA COLI  Final      Susceptibility   Escherichia coli - MIC*    AMPICILLIN >=32 RESISTANT Resistant     CEFEPIME <=0.12 SENSITIVE Sensitive     CEFTAZIDIME <=1 SENSITIVE Sensitive     CEFTRIAXONE <=0.25 SENSITIVE Sensitive     CIPROFLOXACIN >=4 RESISTANT Resistant     GENTAMICIN >=16 RESISTANT Resistant     IMIPENEM <=0.25 SENSITIVE Sensitive     TRIMETH/SULFA >=320 RESISTANT Resistant     AMPICILLIN/SULBACTAM 8 SENSITIVE Sensitive     PIP/TAZO <=4 SENSITIVE Sensitive ug/mL   Escherichia coli - KIRBY BAUER*    CEFAZOLIN INTERMEDIATE Intermediate     * MODERATE ESCHERICHIA COLI    MODERATE ESCHERICHIA COLI   Pseudomonas aeruginosa - MIC*    CEFTAZIDIME 4 SENSITIVE Sensitive     CIPROFLOXACIN <=0.25 SENSITIVE Sensitive     GENTAMICIN <=1 SENSITIVE Sensitive     IMIPENEM 2 SENSITIVE Sensitive     PIP/TAZO 8 SENSITIVE Sensitive ug/mL    CEFEPIME 2 SENSITIVE Sensitive     *  MODERATE PSEUDOMONAS AERUGINOSA  Aerobic/Anaerobic Culture w Gram Stain (surgical/deep wound)     Status: None (Preliminary result)   Collection Time: 06/24/23  9:08 AM   Specimen: Path fluid; Body Fluid  Result Value Ref Range Status   Specimen Description FLUID  Final   Special Requests abdomen  Final   Gram Stain   Final    FEW WBC PRESENT, PREDOMINANTLY PMN NO ORGANISMS SEEN Performed at [facility], [address], [place], [place] [address]    Culture   Final    FEW ESCHERICHIA COLI NO ANAEROBES ISOLATED; CULTURE IN PROGRESS FOR 5 DAYS    Report Status PENDING  Incomplete   Organism ID, Bacteria ESCHERICHIA COLI  Final   Organism ID, Bacteria ESCHERICHIA COLI   Final      Susceptibility   Escherichia coli - MIC*    AMPICILLIN >=32 RESISTANT Resistant     CEFEPIME <=0.12 SENSITIVE Sensitive     CEFTAZIDIME <=1 SENSITIVE Sensitive     CEFTRIAXONE <=0.25 SENSITIVE Sensitive     CIPROFLOXACIN >=4 RESISTANT Resistant     GENTAMICIN >=16 RESISTANT Resistant     IMIPENEM <=0.25 SENSITIVE Sensitive     TRIMETH/SULFA >=320 RESISTANT Resistant     AMPICILLIN/SULBACTAM 16 INTERMEDIATE Intermediate     PIP/TAZO <=4 SENSITIVE Sensitive ug/mL   Escherichia coli - KIRBY BAUER*    CEFAZOLIN INTERMEDIATE Intermediate     * FEW ESCHERICHIA COLI    FEW ESCHERICHIA COLI    Procedures/Studies: IR IMAGE GUIDED DRAINAGE BY PERCUTANEOUS CATHETER Result Date: 06/24/2023 CLINICAL DATA:  Panniculectomy with lower abdominal fluid collection EXAM: ULTRASOUND and FLUOROSCOPIC-GUIDED DRAINAGE CATHETER PLACEMENT into LOWER ABDOMINAL WALL FLUID COLLECTION COMPARISON:  CT AP, 06/23/2023 MEDICATIONS: The patient was on scheduled antibiotics. ANESTHESIA/SEDATION: Local anesthetic and single agent sedation was employed during this procedure. A total of fentanyl 100 mcg was administered intravenously. The patient's level of consciousness and vital signs were monitored continuously by radiology nursing throughout the procedure under my direct supervision. FLUOROSCOPY: Fluoroscopic dose; 1 mGy COMPLICATIONS: None immediate. PROCEDURE: Informed written consent was obtained from the patient and/or patient's representative after a discussion of the risks, benefits and alternatives to treatment. Pre-procedural ultrasound scanning demonstrated subcutaneous fluid collection at the lower abdomen, underlying the incision. A timeout was performed prior to the initiation of the procedure. The lower abdomen was prepped and draped in the usual sterile fashion. The overlying soft tissues were anesthetized with 1% lidocaine with epinephrine. Under direct ultrasound guidance, a 18 gauge trocar needle was  advanced, then a short Amplatz super stiff wire was coiled within the collection. Ultrasound images were saved for procedural documentation purposes. Fluoroscopic evaluation of the wire demonstrated but the wire crossed the midline. The tract was serially dilated allowing placement of a 12 Fr drainage catheter. Final catheter positioning was confirmed with a fluoroscopic radiographic image. 10 mL of seromatous fluid was aspirated. A representative sample of aspirated fluid was capped and sent to the laboratory for analysis. The tube was connected to a bulb suction and sutured in place. A dressing was placed. The patient tolerated the procedure well without immediate post procedural complication. FINDINGS: Lower abdominal fluid collection in communication with the guidewire crossing the midline. A unilateral drainage catheter was therefore recommended. IMPRESSION: Successful [place] and fluoroscopic-guided drainage catheter placement into lower quadrant abdominal wall fluid collection. 10 mL of seromatous fluid was aspirated and a representative aspirated sample was sent to the laboratory as requested by the ordering clinical team. RECOMMENDATIONS: The patient  will return to Vascular Interventional Radiology (VIR) for routine drainage catheter evaluation in 7-10 days. Roanna Banning, MD [facility] Radiology Electronically Signed   By: Roanna Banning M.D.   On: 06/24/2023 14:47   IR US Guidance Result Date: 06/24/2023 CLINICAL DATA:  Panniculectomy with lower abdominal fluid collection EXAM: ULTRASOUND and FLUOROSCOPIC-GUIDED DRAINAGE CATHETER PLACEMENT into LOWER ABDOMINAL WALL FLUID COLLECTION COMPARISON:  CT AP, 06/23/2023 MEDICATIONS: The patient was on scheduled antibiotics. ANESTHESIA/SEDATION: Local anesthetic and single agent sedation was employed during this procedure. A total of fentanyl 100 mcg was administered intravenously. The patient's level of consciousness  and vital signs were monitored continuously by radiology nursing throughout the procedure under my direct supervision. FLUOROSCOPY: Fluoroscopic dose; 1 mGy COMPLICATIONS: None immediate. PROCEDURE: Informed written consent was obtained from the patient and/or patient's representative after a discussion of the risks, benefits and alternatives to treatment. Pre-procedural ultrasound scanning demonstrated subcutaneous fluid collection at the lower abdomen, underlying the incision. A timeout was performed prior to the initiation of the procedure. The lower abdomen was prepped and draped in the usual sterile fashion. The overlying soft tissues were anesthetized with 1% lidocaine with epinephrine. Under direct ultrasound guidance, a 18 gauge trocar needle was advanced, then a short Amplatz super stiff wire was coiled within the collection. Ultrasound images were saved for procedural documentation purposes. Fluoroscopic evaluation of the wire demonstrated but the wire crossed the midline. The tract was serially dilated allowing placement of a 12 Fr drainage catheter. Final catheter positioning was confirmed with a fluoroscopic radiographic image. 10 mL of seromatous fluid was aspirated. A representative sample of aspirated fluid was capped and sent to the laboratory for analysis. The tube was connected to a bulb suction and sutured in place. A dressing was placed. The patient tolerated the procedure well without immediate post procedural complication. FINDINGS: Lower abdominal fluid collection in communication with the guidewire crossing the midline. A unilateral drainage catheter was therefore recommended. IMPRESSION: Successful [place] and fluoroscopic-guided drainage catheter placement into lower quadrant abdominal wall fluid collection. 10 mL of seromatous fluid was aspirated and a representative aspirated sample was sent to the laboratory as requested by the ordering clinical team. RECOMMENDATIONS: The patient will return  to Vascular Interventional Radiology (VIR) for routine drainage catheter evaluation in 7-10 days. Roanna Banning, MD [facility] Radiology Electronically Signed   By: Roanna Banning M.D.   On: 06/24/2023 14:47   MR BRAIN WO CONTRAST Result Date: 06/24/2023 CLINICAL DATA:  Stroke suspected EXAM: MRI HEAD WITHOUT CONTRAST TECHNIQUE: Multiplanar, multiecho pulse sequences of the brain and surrounding structures were obtained without intravenous contrast. COMPARISON:  None Available. FINDINGS: Brain: No acute infarct, mass effect or extra-axial collection. No acute or chronic hemorrhage. Normal white matter signal, parenchymal volume and CSF spaces. The midline structures are normal. Vascular: Normal flow voids. Skull and upper cervical spine: Normal calvarium and skull base. Visualized upper cervical spine and soft tissues are normal. Sinuses/Orbits:No paranasal sinus fluid levels or advanced mucosal thickening. No mastoid or middle ear effusion. Normal orbits. IMPRESSION: Normal brain MRI. Electronically Signed   By: Deatra Robinson M.D.   On: 06/24/2023 02:51   CT ABDOMEN PELVIS W CONTRAST Result Date: 06/23/2023 CLINICAL DATA:  Abdomen pain EXAM: CT ABDOMEN AND PELVIS WITH CONTRAST TECHNIQUE: Multidetector CT imaging of the abdomen and pelvis was performed using the standard protocol following bolus administration of intravenous contrast. RADIATION DOSE REDUCTION: This exam was performed according to the departmental  dose-optimization program which includes automated exposure control, adjustment of the mA and/or kV according to patient size and/or use of iterative reconstruction technique. CONTRAST:  75mL OMNIPAQUE IOHEXOL 350 MG/ML SOLN COMPARISON:  None Available. FINDINGS: Lower chest: Lung bases demonstrate no acute airspace disease. Hepatobiliary: Hepatic steatosis. Cholecystectomy. No biliary dilatation Pancreas: Unremarkable. No pancreatic ductal dilatation or  surrounding inflammatory changes. Spleen: Normal in size without focal abnormality. Adrenals/Urinary Tract: Adrenal glands are unremarkable. Kidneys are normal, without renal calculi, focal lesion, or hydronephrosis. Bladder is unremarkable. Stomach/Bowel: Stomach is within normal limits. Appendix appears normal. No evidence of bowel wall thickening, distention, or inflammatory changes. Vascular/Lymphatic: No significant vascular findings are present. No enlarged abdominal or pelvic lymph nodes. Reproductive: Uterus and bilateral adnexa are unremarkable. Other: Negative for pelvic effusion or free air. Extensive stranding and edema within the subcutaneous soft tissues of the anterior abdominal wall with fluid collection superficial to the rectus, this measures 15 mm maximum AP thickness over the right rectus and 15 mm maximum thickness over the left rectus and extends over a horizontal diameter of 20 cm. No strong rim enhancement. No internal gas. Few clips in the region. Soft tissue wound or defect along the midline inferior 21 abdominal wall Nonspecific mild stranding within the mesentery root with small nodes. Musculoskeletal: No acute or suspicious osseous abnormality IMPRESSION: 1. Extensive stranding and edema within the subcutaneous soft tissues of the anterior abdominal wall with fluid collection superficial to the rectus muscles measuring 15 mm maximum AP thickness over the right rectus and 15 mm maximum thickness over the left rectus and extends over a horizontal diameter of 20 cm. No strong rim enhancement. No internal gas. Findings could be secondary to postoperative change and postoperative fluid collection, but infection could also produce this appearance. 2. Hepatic steatosis. Electronically Signed   By: Jasmine Pang M.D.   On: 06/23/2023 19:02   DG Abd Portable 1V Result Date: 06/23/2023 CLINICAL DATA:  Pain EXAM: PORTABLE ABDOMEN - 1 VIEW COMPARISON:  06/15/2007 FINDINGS: The bowel gas pattern  is normal. Moderate volume stool throughout the colon. Cholecystectomy clips. No radio-opaque calculi or other significant radiographic abnormality are seen. IMPRESSION: Nonobstructive bowel gas pattern. Moderate volume stool throughout the colon. Electronically Signed   By: Duanne Guess D.O.   On: 06/23/2023 15:32    Time coordinating discharge: 45 mins  SIGNED:  Carollee Herter, DO [facility] 06/27/23, 1:46 PM

## 2023-06-29 LAB — AEROBIC/ANAEROBIC CULTURE W GRAM STAIN (SURGICAL/DEEP WOUND)

## 2023-06-30 ENCOUNTER — Other Ambulatory Visit: Payer: Self-pay | Admitting: Internal Medicine

## 2023-06-30 DIAGNOSIS — L03311 Cellulitis of abdominal wall: Secondary | ICD-10-CM

## 2023-07-01 ENCOUNTER — Telehealth: Payer: Self-pay

## 2023-07-01 NOTE — Telephone Encounter (Signed)
Called patient back, looks like we did not call for [facility].  When she was iNP. Received acceptance from [place],. They can see her Monday or Tuesday. She will be at her surgeons office on Monday and will get wound redressed, Apology given regarding delay in services. Patient  accepting of the plan. [facility] has already called her to set up services.

## 2023-07-01 NOTE — Telephone Encounter (Signed)
Patient called in to say that she is running out of wound care supplies Telfa orange foam , abd pads or gauze. Has not seen or heard from Home health chart reviewed. Called patient, reached out to [facility] and am awaiting call back. It was not documented that a agency had been called for Home health.  Will update patient as this RNCM finds out more. She states the drain is flushing fine,. Not that much drainage surround or on abdominal wound.

## 2023-07-12 ENCOUNTER — Inpatient Hospital Stay: Admission: RE | Admit: 2023-07-12 | Payer: BC Managed Care – PPO | Source: Ambulatory Visit

## 2023-07-12 ENCOUNTER — Encounter: Payer: Self-pay | Admitting: Internal Medicine

## 2023-07-12 ENCOUNTER — Other Ambulatory Visit: Payer: BC Managed Care – PPO

## 2023-07-22 DIAGNOSIS — F4310 Post-traumatic stress disorder, unspecified: Principal | ICD-10-CM

## 2023-07-22 DIAGNOSIS — G540 Brachial plexus disorders: Principal | ICD-10-CM

## 2023-07-22 MED ORDER — LORAZEPAM 1 MG TABLET
ORAL_TABLET | Freq: Every day | ORAL | 5 refills | 10.00 days | PRN
Start: 2023-07-22 — End: 2024-07-21

## 2023-07-22 MED ORDER — TIZANIDINE 4 MG TABLET
ORAL_TABLET | Freq: Three times a day (TID) | ORAL | 0 refills | 30.00 days
Start: 2023-07-22 — End: ?

## 2023-07-22 MED ORDER — HYDROCODONE 5 MG-ACETAMINOPHEN 325 MG TABLET
ORAL_TABLET | Freq: Every day | ORAL | 0 refills | 40.00 days | PRN
Start: 2023-07-22 — End: ?

## 2023-07-24 MED ORDER — TIZANIDINE 4 MG TABLET
ORAL_TABLET | Freq: Three times a day (TID) | ORAL | 3 refills | 30.00 days | Status: CP
Start: 2023-07-24 — End: ?

## 2023-07-31 MED ORDER — LORAZEPAM 1 MG TABLET
ORAL_TABLET | Freq: Every day | ORAL | 5 refills | 10 days | Status: CP | PRN
Start: 2023-07-31 — End: 2024-07-30

## 2023-07-31 MED ORDER — HYDROCODONE 5 MG-ACETAMINOPHEN 325 MG TABLET
ORAL_TABLET | Freq: Every day | ORAL | 0 refills | 40.00 days | Status: CP | PRN
Start: 2023-07-31 — End: ?

## 2023-09-06 ENCOUNTER — Encounter: Admit: 2023-09-06 | Discharge: 2023-09-07

## 2023-09-06 MED ORDER — NITROFURANTOIN MONOHYDRATE/MACROCRYSTALS 100 MG CAPSULE
ORAL_CAPSULE | Freq: Two times a day (BID) | ORAL | 0 refills | 7 days | Status: CP
Start: 2023-09-06 — End: 2023-09-13

## 2023-10-02 ENCOUNTER — Ambulatory Visit
Admit: 2023-10-02 | Discharge: 2023-10-03 | Payer: BLUE CROSS/BLUE SHIELD | Attending: Family Medicine | Primary: Family Medicine

## 2023-10-02 MED ORDER — HYDROCODONE 5 MG-ACETAMINOPHEN 325 MG TABLET
ORAL_TABLET | Freq: Every day | ORAL | 0 refills | 40.00000 days | Status: CP | PRN
Start: 2023-10-02 — End: ?

## 2023-10-02 MED ORDER — LORAZEPAM 1 MG TABLET
ORAL_TABLET | Freq: Every day | ORAL | 0 refills | 60.00000 days | Status: CP | PRN
Start: 2023-10-02 — End: 2024-10-01

## 2023-10-18 ENCOUNTER — Other Ambulatory Visit: Payer: Self-pay

## 2023-10-30 MED ORDER — HYDROCODONE 5 MG-ACETAMINOPHEN 325 MG TABLET
ORAL_TABLET | Freq: Every day | ORAL | 0 refills | 40.00000 days | Status: CP | PRN
Start: 2023-10-30 — End: ?

## 2023-11-27 MED ORDER — HYDROCODONE 5 MG-ACETAMINOPHEN 325 MG TABLET
ORAL_TABLET | Freq: Every day | ORAL | 0 refills | 40.00000 days | Status: CP | PRN
Start: 2023-11-27 — End: ?

## 2023-12-29 DIAGNOSIS — F4310 Post-traumatic stress disorder, unspecified: Principal | ICD-10-CM

## 2023-12-29 DIAGNOSIS — G540 Brachial plexus disorders: Principal | ICD-10-CM

## 2023-12-29 MED ORDER — LORAZEPAM 1 MG TABLET
ORAL_TABLET | Freq: Every day | ORAL | 0 refills | 0.00000 days | PRN
Start: 2023-12-29 — End: ?

## 2023-12-29 MED ORDER — HYDROCODONE 5 MG-ACETAMINOPHEN 325 MG TABLET
ORAL_TABLET | Freq: Every day | ORAL | 0 refills | 40.00000 days | Status: CP | PRN
Start: 2023-12-29 — End: ?

## 2024-01-01 MED ORDER — LORAZEPAM 1 MG TABLET
ORAL_TABLET | Freq: Every day | ORAL | 0 refills | 60.00000 days | Status: CP | PRN
Start: 2024-01-01 — End: ?

## 2024-01-06 DIAGNOSIS — G43801 Other migraine, not intractable, with status migrainosus: Principal | ICD-10-CM

## 2024-01-06 MED ORDER — QUETIAPINE 50 MG TABLET
ORAL_TABLET | 1 refills | 0.00000 days
Start: 2024-01-06 — End: ?

## 2024-01-08 MED ORDER — QUETIAPINE 50 MG TABLET
ORAL_TABLET | ORAL | 1 refills | 0.00000 days | Status: CP
Start: 2024-01-08 — End: ?

## 2024-02-16 DIAGNOSIS — G540 Brachial plexus disorders: Principal | ICD-10-CM

## 2024-02-16 MED ORDER — HYDROCODONE 5 MG-ACETAMINOPHEN 325 MG TABLET
ORAL_TABLET | Freq: Every day | ORAL | 0 refills | 40.00000 days | Status: CP | PRN
Start: 2024-02-16 — End: ?

## 2024-03-22 DIAGNOSIS — G540 Brachial plexus disorders: Principal | ICD-10-CM

## 2024-03-22 MED ORDER — HYDROCODONE 5 MG-ACETAMINOPHEN 325 MG TABLET
ORAL_TABLET | Freq: Every day | ORAL | 0 refills | 40.00000 days | PRN
Start: 2024-03-22 — End: ?

## 2024-03-25 DIAGNOSIS — G540 Brachial plexus disorders: Principal | ICD-10-CM

## 2024-03-25 DIAGNOSIS — Z79891 Long term (current) use of opiate analgesic: Principal | ICD-10-CM

## 2024-03-25 DIAGNOSIS — K649 Unspecified hemorrhoids: Principal | ICD-10-CM

## 2024-03-25 DIAGNOSIS — F4310 Post-traumatic stress disorder, unspecified: Principal | ICD-10-CM

## 2024-03-25 DIAGNOSIS — Z79899 Other long term (current) drug therapy: Principal | ICD-10-CM

## 2024-03-25 MED ORDER — HYDROCORTISONE ACETATE 25 MG RECTAL SUPPOSITORY
Freq: Two times a day (BID) | RECTAL | 0 refills | 30.00000 days | Status: CP
Start: 2024-03-25 — End: 2025-03-25

## 2024-03-25 MED ORDER — LORAZEPAM 1 MG TABLET
ORAL_TABLET | Freq: Every day | ORAL | 0 refills | 60.00000 days | Status: CP | PRN
Start: 2024-03-25 — End: ?

## 2024-03-25 MED ORDER — HYDROCODONE 5 MG-ACETAMINOPHEN 325 MG TABLET
ORAL_TABLET | Freq: Every day | ORAL | 0 refills | 40.00000 days | Status: CP | PRN
Start: 2024-03-25 — End: ?

## 2024-04-29 ENCOUNTER — Encounter
Admit: 2024-04-29 | Discharge: 2024-04-29 | Payer: BLUE CROSS/BLUE SHIELD | Attending: Family Medicine | Primary: Family Medicine

## 2024-04-29 DIAGNOSIS — Z79891 Long term (current) use of opiate analgesic: Principal | ICD-10-CM

## 2024-05-09 DIAGNOSIS — F4310 Post-traumatic stress disorder, unspecified: Principal | ICD-10-CM

## 2024-05-09 DIAGNOSIS — G43801 Other migraine, not intractable, with status migrainosus: Principal | ICD-10-CM

## 2024-05-09 DIAGNOSIS — G540 Brachial plexus disorders: Principal | ICD-10-CM

## 2024-05-09 MED ORDER — TIZANIDINE 4 MG TABLET
ORAL_TABLET | Freq: Three times a day (TID) | ORAL | 3 refills | 30.00000 days
Start: 2024-05-09 — End: ?

## 2024-05-09 MED ORDER — HYDROCODONE 5 MG-ACETAMINOPHEN 325 MG TABLET
ORAL_TABLET | Freq: Every day | ORAL | 0 refills | 40.00000 days | PRN
Start: 2024-05-09 — End: ?

## 2024-05-09 MED ORDER — QUETIAPINE 50 MG TABLET
ORAL_TABLET | 1 refills | 0.00000 days
Start: 2024-05-09 — End: ?

## 2024-05-09 MED ORDER — LORAZEPAM 1 MG TABLET
ORAL_TABLET | Freq: Every day | ORAL | 0 refills | 60.00000 days | PRN
Start: 2024-05-09 — End: ?

## 2024-05-10 MED ORDER — QUETIAPINE 50 MG TABLET
ORAL_TABLET | Freq: Every evening | ORAL | 1 refills | 90.00000 days | Status: CP | PRN
Start: 2024-05-10 — End: ?

## 2024-05-10 MED ORDER — HYDROCODONE 5 MG-ACETAMINOPHEN 325 MG TABLET
ORAL_TABLET | Freq: Every day | ORAL | 0 refills | 40.00000 days | Status: CP | PRN
Start: 2024-05-10 — End: ?

## 2024-05-10 MED ORDER — TIZANIDINE 4 MG TABLET
ORAL_TABLET | Freq: Three times a day (TID) | ORAL | 3 refills | 30.00000 days | Status: CP
Start: 2024-05-10 — End: ?

## 2024-05-10 MED ORDER — LORAZEPAM 1 MG TABLET
ORAL_TABLET | Freq: Every day | ORAL | 1 refills | 60.00000 days | Status: CP | PRN
Start: 2024-05-10 — End: ?

## 2024-05-24 DIAGNOSIS — Z1231 Encounter for screening mammogram for malignant neoplasm of breast: Principal | ICD-10-CM

## 2024-05-27 ENCOUNTER — Inpatient Hospital Stay: Admit: 2024-05-27 | Discharge: 2024-05-27 | Payer: Medicaid (Managed Care)

## 2024-05-29 MED ORDER — DICYCLOMINE 10 MG CAPSULE
ORAL_CAPSULE | Freq: Four times a day (QID) | ORAL | 0 refills | 14.00000 days | Status: CP
Start: 2024-05-29 — End: 2024-06-12

## 2024-06-18 DIAGNOSIS — G540 Brachial plexus disorders: Principal | ICD-10-CM

## 2024-06-18 MED ORDER — HYDROCODONE 5 MG-ACETAMINOPHEN 325 MG TABLET
ORAL_TABLET | Freq: Every day | ORAL | 0 refills | 40.00000 days | Status: CP | PRN
Start: 2024-06-18 — End: 2024-07-18

## 2024-07-11 DIAGNOSIS — G540 Brachial plexus disorders: Principal | ICD-10-CM

## 2024-07-11 DIAGNOSIS — G43801 Other migraine, not intractable, with status migrainosus: Principal | ICD-10-CM

## 2024-07-11 MED ORDER — TIZANIDINE 4 MG TABLET
ORAL_TABLET | Freq: Three times a day (TID) | ORAL | 3 refills | 30.00000 days | Status: CP
Start: 2024-07-11 — End: ?

## 2024-07-11 MED ORDER — QUETIAPINE 50 MG TABLET
ORAL_TABLET | Freq: Every evening | ORAL | 1 refills | 90.00000 days | Status: CP | PRN
Start: 2024-07-11 — End: ?

## 2024-07-11 MED ORDER — HYDROCODONE 5 MG-ACETAMINOPHEN 325 MG TABLET
ORAL_TABLET | Freq: Every day | ORAL | 0 refills | 40.00000 days | Status: CP | PRN
Start: 2024-07-11 — End: 2024-08-10
